# Patient Record
Sex: Female | Born: 1950
Health system: Southern US, Community
[De-identification: ages and names within clinical notes are randomized; demographics above are authoritative.]

## PROBLEM LIST (undated history)

## (undated) DIAGNOSIS — Z800 Family history of malignant neoplasm of digestive organs: Secondary | ICD-10-CM

## (undated) DIAGNOSIS — I341 Nonrheumatic mitral (valve) prolapse: Secondary | ICD-10-CM

## (undated) DIAGNOSIS — B029 Zoster without complications: Secondary | ICD-10-CM

## (undated) DIAGNOSIS — Z803 Family history of malignant neoplasm of breast: Secondary | ICD-10-CM

## (undated) DIAGNOSIS — T7840XA Allergy, unspecified, initial encounter: Secondary | ICD-10-CM

## (undated) DIAGNOSIS — N951 Menopausal and female climacteric states: Secondary | ICD-10-CM

## (undated) DIAGNOSIS — M797 Fibromyalgia: Secondary | ICD-10-CM

## (undated) DIAGNOSIS — C50919 Malignant neoplasm of unspecified site of unspecified female breast: Secondary | ICD-10-CM

## (undated) DIAGNOSIS — M1990 Unspecified osteoarthritis, unspecified site: Secondary | ICD-10-CM

## (undated) DIAGNOSIS — K219 Gastro-esophageal reflux disease without esophagitis: Secondary | ICD-10-CM

## (undated) DIAGNOSIS — E669 Obesity, unspecified: Secondary | ICD-10-CM

## (undated) DIAGNOSIS — I10 Essential (primary) hypertension: Secondary | ICD-10-CM

## (undated) DIAGNOSIS — G4730 Sleep apnea, unspecified: Secondary | ICD-10-CM

## (undated) DIAGNOSIS — R011 Cardiac murmur, unspecified: Secondary | ICD-10-CM

## (undated) DIAGNOSIS — F419 Anxiety disorder, unspecified: Secondary | ICD-10-CM

## (undated) HISTORY — DX: Fibromyalgia: M79.7

## (undated) HISTORY — DX: Nonrheumatic mitral (valve) prolapse: I34.1

## (undated) HISTORY — DX: Family history of malignant neoplasm of digestive organs: Z80.0

## (undated) HISTORY — DX: Essential (primary) hypertension: I10

## (undated) HISTORY — DX: Malignant neoplasm of unspecified site of unspecified female breast: C50.919

## (undated) HISTORY — DX: Family history of malignant neoplasm of breast: Z80.3

## (undated) HISTORY — PX: BIOPSY BREAST: PRO8

## (undated) HISTORY — DX: Gastro-esophageal reflux disease without esophagitis: K21.9

## (undated) HISTORY — DX: Obesity, unspecified: E66.9

## (undated) HISTORY — DX: Sleep apnea, unspecified: G47.30

## (undated) HISTORY — DX: Anxiety disorder, unspecified: F41.9

## (undated) HISTORY — DX: Menopausal and female climacteric states: N95.1

---

## 1994-06-02 HISTORY — PX: KNEE SURGERY: SHX244

## 1999-10-09 ENCOUNTER — Other Ambulatory Visit: Admission: RE | Admit: 1999-10-09 | Discharge: 1999-10-09 | Payer: Self-pay | Admitting: Obstetrics and Gynecology

## 1999-12-31 ENCOUNTER — Encounter: Payer: Self-pay | Admitting: Family Medicine

## 1999-12-31 ENCOUNTER — Encounter: Admission: RE | Admit: 1999-12-31 | Discharge: 1999-12-31 | Payer: Self-pay | Admitting: Family Medicine

## 2000-01-08 ENCOUNTER — Encounter: Payer: Self-pay | Admitting: Family Medicine

## 2000-01-08 ENCOUNTER — Encounter: Admission: RE | Admit: 2000-01-08 | Discharge: 2000-01-08 | Payer: Self-pay | Admitting: Family Medicine

## 2000-10-21 ENCOUNTER — Other Ambulatory Visit: Admission: RE | Admit: 2000-10-21 | Discharge: 2000-10-21 | Payer: Self-pay | Admitting: Obstetrics and Gynecology

## 2001-01-06 ENCOUNTER — Encounter: Admission: RE | Admit: 2001-01-06 | Discharge: 2001-01-06 | Payer: Self-pay | Admitting: Obstetrics and Gynecology

## 2001-01-06 ENCOUNTER — Encounter: Payer: Self-pay | Admitting: Obstetrics and Gynecology

## 2001-01-25 ENCOUNTER — Emergency Department (HOSPITAL_COMMUNITY): Admission: EM | Admit: 2001-01-25 | Discharge: 2001-01-25 | Payer: Self-pay | Admitting: Emergency Medicine

## 2001-01-25 ENCOUNTER — Encounter: Payer: Self-pay | Admitting: Emergency Medicine

## 2001-10-20 ENCOUNTER — Other Ambulatory Visit: Admission: RE | Admit: 2001-10-20 | Discharge: 2001-10-20 | Payer: Self-pay | Admitting: Obstetrics and Gynecology

## 2002-01-12 ENCOUNTER — Encounter: Admission: RE | Admit: 2002-01-12 | Discharge: 2002-01-12 | Payer: Self-pay | Admitting: Obstetrics and Gynecology

## 2002-01-12 ENCOUNTER — Encounter: Payer: Self-pay | Admitting: Obstetrics and Gynecology

## 2002-10-19 ENCOUNTER — Other Ambulatory Visit: Admission: RE | Admit: 2002-10-19 | Discharge: 2002-10-19 | Payer: Self-pay | Admitting: Obstetrics and Gynecology

## 2003-01-24 ENCOUNTER — Encounter: Admission: RE | Admit: 2003-01-24 | Discharge: 2003-01-24 | Payer: Self-pay | Admitting: Obstetrics and Gynecology

## 2003-01-24 ENCOUNTER — Encounter: Payer: Self-pay | Admitting: Obstetrics and Gynecology

## 2003-10-24 ENCOUNTER — Other Ambulatory Visit: Admission: RE | Admit: 2003-10-24 | Discharge: 2003-10-24 | Payer: Self-pay | Admitting: Obstetrics and Gynecology

## 2004-03-26 ENCOUNTER — Encounter: Admission: RE | Admit: 2004-03-26 | Discharge: 2004-03-26 | Payer: Self-pay | Admitting: Obstetrics and Gynecology

## 2005-01-01 ENCOUNTER — Other Ambulatory Visit: Admission: RE | Admit: 2005-01-01 | Discharge: 2005-01-01 | Payer: Self-pay | Admitting: Obstetrics and Gynecology

## 2005-03-31 ENCOUNTER — Encounter: Admission: RE | Admit: 2005-03-31 | Discharge: 2005-03-31 | Payer: Self-pay | Admitting: Obstetrics and Gynecology

## 2006-01-21 ENCOUNTER — Other Ambulatory Visit: Admission: RE | Admit: 2006-01-21 | Discharge: 2006-01-21 | Payer: Self-pay | Admitting: Obstetrics & Gynecology

## 2006-03-19 ENCOUNTER — Ambulatory Visit (HOSPITAL_COMMUNITY): Admission: RE | Admit: 2006-03-19 | Discharge: 2006-03-19 | Payer: Self-pay | Admitting: Obstetrics & Gynecology

## 2006-04-01 ENCOUNTER — Encounter: Admission: RE | Admit: 2006-04-01 | Discharge: 2006-04-01 | Payer: Self-pay | Admitting: Obstetrics and Gynecology

## 2007-01-27 ENCOUNTER — Other Ambulatory Visit: Admission: RE | Admit: 2007-01-27 | Discharge: 2007-01-27 | Payer: Self-pay | Admitting: Obstetrics & Gynecology

## 2007-04-05 ENCOUNTER — Encounter: Admission: RE | Admit: 2007-04-05 | Discharge: 2007-04-05 | Payer: Self-pay | Admitting: Obstetrics and Gynecology

## 2007-11-09 ENCOUNTER — Encounter: Admission: RE | Admit: 2007-11-09 | Discharge: 2007-11-09 | Payer: Self-pay | Admitting: Obstetrics and Gynecology

## 2007-11-17 ENCOUNTER — Encounter: Admission: RE | Admit: 2007-11-17 | Discharge: 2007-11-17 | Payer: Self-pay | Admitting: Gastroenterology

## 2008-02-02 ENCOUNTER — Other Ambulatory Visit: Admission: RE | Admit: 2008-02-02 | Discharge: 2008-02-02 | Payer: Self-pay | Admitting: Obstetrics and Gynecology

## 2008-04-05 ENCOUNTER — Encounter: Admission: RE | Admit: 2008-04-05 | Discharge: 2008-04-05 | Payer: Self-pay | Admitting: Obstetrics and Gynecology

## 2008-06-28 ENCOUNTER — Encounter: Admission: RE | Admit: 2008-06-28 | Discharge: 2008-06-28 | Payer: Self-pay | Admitting: Gastroenterology

## 2009-05-01 ENCOUNTER — Encounter: Admission: RE | Admit: 2009-05-01 | Discharge: 2009-05-01 | Payer: Self-pay | Admitting: Obstetrics and Gynecology

## 2010-06-19 ENCOUNTER — Encounter: Admission: RE | Admit: 2010-06-19 | Discharge: 2010-06-19 | Payer: Self-pay | Admitting: Obstetrics & Gynecology

## 2011-07-10 ENCOUNTER — Other Ambulatory Visit: Payer: Self-pay | Admitting: Obstetrics & Gynecology

## 2011-07-10 DIAGNOSIS — Z1231 Encounter for screening mammogram for malignant neoplasm of breast: Secondary | ICD-10-CM

## 2011-07-27 ENCOUNTER — Ambulatory Visit
Admission: RE | Admit: 2011-07-27 | Discharge: 2011-07-27 | Disposition: A | Discharge status: Home / Self Care | Payer: BC Managed Care – PPO | Source: Ambulatory Visit | Attending: Obstetrics & Gynecology | Admitting: Obstetrics & Gynecology

## 2011-07-27 DIAGNOSIS — Z1231 Encounter for screening mammogram for malignant neoplasm of breast: Secondary | ICD-10-CM

## 2012-04-19 LAB — HM PAP SMEAR: HM Pap smear: NEGATIVE

## 2013-01-31 ENCOUNTER — Other Ambulatory Visit: Payer: Self-pay

## 2013-01-31 DIAGNOSIS — Z1231 Encounter for screening mammogram for malignant neoplasm of breast: Secondary | ICD-10-CM

## 2013-03-15 ENCOUNTER — Ambulatory Visit
Admission: RE | Admit: 2013-03-15 | Discharge: 2013-03-15 | Disposition: A | Discharge status: Home / Self Care | Payer: BC Managed Care – PPO | Source: Ambulatory Visit

## 2013-03-15 DIAGNOSIS — Z1231 Encounter for screening mammogram for malignant neoplasm of breast: Secondary | ICD-10-CM

## 2013-05-12 ENCOUNTER — Encounter: Payer: Self-pay | Admitting: *Deleted

## 2013-05-16 ENCOUNTER — Ambulatory Visit: Payer: Self-pay | Admitting: Nurse Practitioner

## 2013-05-17 ENCOUNTER — Encounter: Payer: Self-pay | Admitting: Nurse Practitioner

## 2013-05-17 ENCOUNTER — Ambulatory Visit (INDEPENDENT_AMBULATORY_CARE_PROVIDER_SITE_OTHER): Payer: BC Managed Care – PPO | Admitting: Nurse Practitioner

## 2013-05-17 VITALS — BP 122/78 | HR 80 | Resp 14 | Ht 65.0 in | Wt 273.0 lb

## 2013-05-17 DIAGNOSIS — Z0000 Encounter for general adult medical examination without abnormal findings: Secondary | ICD-10-CM

## 2013-05-17 DIAGNOSIS — Z01419 Encounter for gynecological examination (general) (routine) without abnormal findings: Secondary | ICD-10-CM

## 2013-05-17 DIAGNOSIS — E559 Vitamin D deficiency, unspecified: Secondary | ICD-10-CM

## 2013-05-17 LAB — POCT URINALYSIS DIPSTICK
Leukocytes, UA: NEGATIVE
pH, UA: 5

## 2013-05-17 MED ORDER — DESOXIMETASONE 0.25 % EX CREA: TOPICAL_CREAM | Freq: Two times a day (BID) | CUTANEOUS | Status: DC

## 2013-05-17 MED ORDER — ESTRADIOL-NORETHINDRONE ACET 0.05-0.14 MG/DAY TD PTTW: 1.0000 | MEDICATED_PATCH | TRANSDERMAL | Status: DC

## 2013-05-17 MED ORDER — VALACYCLOVIR HCL 1 G PO TABS: 1000.0000 mg | ORAL_TABLET | Freq: Two times a day (BID) | ORAL | Status: DC

## 2013-05-17 MED ORDER — VITAMIN D (ERGOCALCIFEROL) 1.25 MG (50000 UNIT) PO CAPS: 50000.0000 [IU] | ORAL_CAPSULE | ORAL | Status: DC

## 2013-05-17 NOTE — Progress Notes (Signed)
62 y.o. G0P0 Single Caucasian Fe here for annual exam.  Still doing well on HRT and wants to continue.  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   Kens' mom had recent fall at nursing home and now has C 2 fracture.  This new responsibility is changing their life.  Patient's last menstrual period was 11/02/2002.          Sexually active: yes  The current method of family planning is post menopausal status.    Exercising: yes  some walking Smoker:  no  Health Maintenance: Pap:  04/19/12 normal with negative HR HPV MMG:  03/15/13  Colonoscopy: 06/2008 Normal recheck in 10 years BMD:   2006 normal TDaP: not sure  Labs: HGB: 12.8 ; Urine: Normal     Past Medical History  Diagnosis Date  . Perimenopausal   . Obesity   . Hypertension   . Anxiety     situational  . Depression     situational  . MVP (mitral valve prolapse)   . GERD (gastroesophageal reflux disease)   . Fibromyalgia     Past Surgical History  Procedure Laterality Date  . Knee surgery Right 06/1994    Current Outpatient Prescriptions  Medication Sig Dispense Refill  . calcium carbonate (OS-CAL - DOSED IN MG OF ELEMENTAL CALCIUM) 1250 MG tablet Take 1 tablet by mouth daily with breakfast.      . candesartan (ATACAND) 4 MG tablet Take 4 mg by mouth daily.      . cetirizine (ZYRTEC) 10 MG tablet Take 10 mg by mouth daily.      Marland Kitchen esomeprazole (NEXIUM) 40 MG capsule Take 40 mg by mouth daily before breakfast.      . estradiol-norethindrone (COMBIPATCH) 0.05-0.14 MG/DAY Place 1 patch onto the skin 2 (two) times a week.      . loratadine (CLARITIN) 10 MG tablet Take 10 mg by mouth daily.      . mometasone (NASONEX) 50 MCG/ACT nasal spray Place 2 sprays into the nose daily.      . Multiple Vitamin (MULTIVITAMIN) tablet Take 1 tablet by mouth daily.      Marland Kitchen omega-3 acid ethyl esters (LOVAZA) 1 G capsule Take 2 g by mouth 2 (two) times daily.      . rosuvastatin (CRESTOR) 5 MG tablet Take 5 mg by mouth daily.       . valACYclovir (VALTREX) 1000 MG tablet Take 1,000 mg by mouth 2 (two) times daily.      . Vitamin D, Ergocalciferol, (DRISDOL) 50000 UNITS CAPS Take 50,000 Units by mouth every 7 (seven) days.       No current facility-administered medications for this visit.    Family History  Problem Relation Age of Onset  . Diabetes Mother   . Hypertension Mother   . Hypertension Father   . Infertility Sister   . Cancer Paternal Grandmother     pancreatic cancer    ROS:  Pertinent items are noted in HPI.  Otherwise, a comprehensive ROS was negative.  Exam:   BP 122/78  Pulse 80  Resp 14  Ht 5\' 5"  (1.651 m)  Wt 273 lb (123.832 kg)  BMI 45.43 kg/m2  LMP 11/02/2002 Height: 5\' 5"  (165.1 cm)  Ht Readings from Last 3 Encounters:  05/17/13 5\' 5"  (1.651 m)    General appearance: alert, cooperative and appears stated age Head: Normocephalic, without obvious abnormality, atraumatic Neck: no adenopathy, supple, symmetrical, trachea midline and thyroid normal to inspection and  palpation Lungs: clear to auscultation bilaterally Breasts: normal appearance, no masses or tenderness Heart: regular rate and rhythm Abdomen: soft, non-tender; no masses,  no organomegaly Extremities: extremities normal, atraumatic, no cyanosis or edema Skin: Skin color, texture, turgor normal. No rashes or lesions Lymph nodes: Cervical, supraclavicular, and axillary nodes normal. No abnormal inguinal nodes palpated Neurologic: Grossly normal   Pelvic: External genitalia:  no lesions              Urethra:  normal appearing urethra with no masses, tenderness or lesions              Bartholin's and Skene's: normal                 Vagina: normal appearing vagina with normal color and discharge, no lesions              Cervix: anteverted              Pap taken: no Bimanual Exam:  Uterus:  normal size, contour, position, consistency, mobility, non-tender              Adnexa: no mass, fullness, tenderness                Rectovaginal: Confirms               Anus:  normal sphincter tone, no lesions  A:  Well Woman with normal exam   Postmenopausal on HRT  P:   Pap smear as per guidelines   Mammogram  Due 5/15  Refill on HRT Combipatch for 1 year  Discussion about potential risks including CVA, DVT, cancer, etc  Refill on Valtrex prn oral HSV and Topicort for eczema  Refill on Vit D call with lab results  counseled on breast self exam, adequate intake of calcium and vitamin D,   diet and exercise return annually or prn  An After Visit Summary was printed and given to the patient.

## 2013-05-17 NOTE — Patient Instructions (Signed)

## 2013-05-18 ENCOUNTER — Telehealth: Payer: Self-pay | Admitting: *Deleted

## 2013-05-18 LAB — VITAMIN D 25 HYDROXY (VIT D DEFICIENCY, FRACTURES): Vit D, 25-Hydroxy: 27 ng/mL — ABNORMAL LOW (ref 30–89)

## 2013-05-18 NOTE — Telephone Encounter (Signed)
Message copied by Osie Bond on Thu May 18, 2013  1:38 PM ------      Message from: Ria Comment R      Created: Thu May 18, 2013  8:53 AM       Vit D is low again follow protocol ------

## 2013-05-18 NOTE — Telephone Encounter (Signed)
Pt is aware of vitamin D lab results and is agreeable to Vitamin D 50,000 IU 1 po qweekly (pt has Rx from aex visit).

## 2013-05-19 NOTE — Progress Notes (Signed)
Encounter reviewed by Dr. Briann Sarchet Silva.  

## 2014-02-13 ENCOUNTER — Other Ambulatory Visit: Payer: Self-pay

## 2014-02-13 DIAGNOSIS — Z1231 Encounter for screening mammogram for malignant neoplasm of breast: Secondary | ICD-10-CM

## 2014-03-19 ENCOUNTER — Ambulatory Visit: Payer: BC Managed Care – PPO

## 2014-05-17 ENCOUNTER — Ambulatory Visit: Payer: BC Managed Care – PPO

## 2014-05-22 ENCOUNTER — Encounter (INDEPENDENT_AMBULATORY_CARE_PROVIDER_SITE_OTHER): Payer: Self-pay

## 2014-05-22 ENCOUNTER — Ambulatory Visit: Payer: BC Managed Care – PPO

## 2014-05-22 ENCOUNTER — Ambulatory Visit
Admission: RE | Admit: 2014-05-22 | Discharge: 2014-05-22 | Disposition: A | Discharge status: Home / Self Care | Payer: BC Managed Care – PPO | Source: Ambulatory Visit

## 2014-05-22 DIAGNOSIS — Z1231 Encounter for screening mammogram for malignant neoplasm of breast: Secondary | ICD-10-CM

## 2014-05-26 DIAGNOSIS — IMO0002 Reserved for concepts with insufficient information to code with codable children: Secondary | ICD-10-CM

## 2014-05-26 DIAGNOSIS — I059 Rheumatic mitral valve disease, unspecified: Secondary | ICD-10-CM | POA: Insufficient documentation

## 2014-05-26 DIAGNOSIS — G4733 Obstructive sleep apnea (adult) (pediatric): Secondary | ICD-10-CM

## 2014-05-26 DIAGNOSIS — E559 Vitamin D deficiency, unspecified: Secondary | ICD-10-CM

## 2014-05-26 DIAGNOSIS — E669 Obesity, unspecified: Secondary | ICD-10-CM | POA: Insufficient documentation

## 2014-05-26 DIAGNOSIS — M545 Low back pain, unspecified: Secondary | ICD-10-CM

## 2014-05-26 DIAGNOSIS — IMO0001 Reserved for inherently not codable concepts without codable children: Secondary | ICD-10-CM

## 2014-05-26 DIAGNOSIS — E785 Hyperlipidemia, unspecified: Secondary | ICD-10-CM

## 2014-05-26 DIAGNOSIS — I10 Essential (primary) hypertension: Secondary | ICD-10-CM | POA: Insufficient documentation

## 2014-05-26 DIAGNOSIS — M722 Plantar fascial fibromatosis: Secondary | ICD-10-CM

## 2014-05-26 DIAGNOSIS — K529 Noninfective gastroenteritis and colitis, unspecified: Secondary | ICD-10-CM | POA: Insufficient documentation

## 2014-05-26 DIAGNOSIS — R740 Nonspecific elevation of levels of transaminase and lactic acid dehydrogenase [LDH]: Secondary | ICD-10-CM

## 2014-05-26 DIAGNOSIS — E049 Nontoxic goiter, unspecified: Secondary | ICD-10-CM

## 2014-05-26 DIAGNOSIS — K760 Fatty (change of) liver, not elsewhere classified: Secondary | ICD-10-CM

## 2014-05-26 DIAGNOSIS — E782 Mixed hyperlipidemia: Secondary | ICD-10-CM | POA: Insufficient documentation

## 2014-05-26 HISTORY — DX: Reserved for inherently not codable concepts without codable children: IMO0001

## 2014-05-26 HISTORY — DX: Reserved for concepts with insufficient information to code with codable children: IMO0002

## 2014-05-26 HISTORY — DX: Fatty (change of) liver, not elsewhere classified: K76.0

## 2014-05-26 HISTORY — DX: Essential (primary) hypertension: I10

## 2014-05-26 HISTORY — DX: Plantar fascial fibromatosis: M72.2

## 2014-05-26 HISTORY — DX: Rheumatic mitral valve disease, unspecified: I05.9

## 2014-05-26 HISTORY — DX: Obstructive sleep apnea (adult) (pediatric): G47.33

## 2014-05-26 HISTORY — DX: Hyperlipidemia, unspecified: E78.5

## 2014-05-26 HISTORY — DX: Low back pain, unspecified: M54.50

## 2014-05-26 HISTORY — DX: Vitamin D deficiency, unspecified: E55.9

## 2014-05-26 HISTORY — DX: Obesity, unspecified: E66.9

## 2014-05-26 HISTORY — DX: Nontoxic goiter, unspecified: E04.9

## 2014-05-29 ENCOUNTER — Ambulatory Visit (INDEPENDENT_AMBULATORY_CARE_PROVIDER_SITE_OTHER): Payer: BC Managed Care – PPO | Admitting: Nurse Practitioner

## 2014-05-29 ENCOUNTER — Encounter: Payer: Self-pay | Admitting: Nurse Practitioner

## 2014-05-29 VITALS — BP 140/76 | HR 68 | Resp 18 | Ht 65.0 in | Wt 265.0 lb

## 2014-05-29 DIAGNOSIS — Z0000 Encounter for general adult medical examination without abnormal findings: Secondary | ICD-10-CM

## 2014-05-29 DIAGNOSIS — Z01419 Encounter for gynecological examination (general) (routine) without abnormal findings: Secondary | ICD-10-CM

## 2014-05-29 DIAGNOSIS — Z1211 Encounter for screening for malignant neoplasm of colon: Secondary | ICD-10-CM

## 2014-05-29 LAB — CBC WITH DIFFERENTIAL/PLATELET
Basophils Absolute: 0 10*3/uL (ref 0.0–0.1)
Basophils Relative: 0 % (ref 0–1)
Eosinophils Absolute: 0.1 10*3/uL (ref 0.0–0.7)
Eosinophils Relative: 1 % (ref 0–5)
HEMATOCRIT: 38.5 % (ref 36.0–46.0)
Hemoglobin: 13 g/dL (ref 12.0–15.0)
LYMPHS PCT: 34 % (ref 12–46)
Lymphs Abs: 2.1 10*3/uL (ref 0.7–4.0)
MCH: 29 pg (ref 26.0–34.0)
MCHC: 33.8 g/dL (ref 30.0–36.0)
MCV: 85.9 fL (ref 78.0–100.0)
MONO ABS: 0.4 10*3/uL (ref 0.1–1.0)
Monocytes Relative: 6 % (ref 3–12)
Neutro Abs: 3.6 10*3/uL (ref 1.7–7.7)
Neutrophils Relative %: 59 % (ref 43–77)
Platelets: 259 10*3/uL (ref 150–400)
RBC: 4.48 MIL/uL (ref 3.87–5.11)
RDW: 14.1 % (ref 11.5–15.5)
WBC: 6.1 10*3/uL (ref 4.0–10.5)

## 2014-05-29 LAB — POCT URINALYSIS DIPSTICK
Bilirubin, UA: NEGATIVE
Glucose, UA: NEGATIVE
Ketones, UA: NEGATIVE
Leukocytes, UA: NEGATIVE
Nitrite, UA: NEGATIVE
PROTEIN UA: NEGATIVE
RBC UA: NEGATIVE
UROBILINOGEN UA: NEGATIVE
pH, UA: 7

## 2014-05-29 LAB — LIPID PANEL
CHOL/HDL RATIO: 5 ratio
CHOLESTEROL: 237 mg/dL — AB (ref 0–200)
HDL: 47 mg/dL (ref >39)
LDL CALC: 139 mg/dL — AB (ref 0–99)
Triglycerides: 253 mg/dL — ABNORMAL HIGH (ref <150)
VLDL: 51 mg/dL — AB (ref 0–40)

## 2014-05-29 LAB — COMPREHENSIVE METABOLIC PANEL
ALK PHOS: 60 U/L (ref 39–117)
ALT: 19 U/L (ref 0–35)
AST: 18 U/L (ref 0–37)
Albumin: 4 g/dL (ref 3.5–5.2)
BILIRUBIN TOTAL: 0.4 mg/dL (ref 0.2–1.2)
BUN: 16 mg/dL (ref 6–23)
CO2: 28 meq/L (ref 19–32)
CREATININE: 0.8 mg/dL (ref 0.50–1.10)
Calcium: 9.4 mg/dL (ref 8.4–10.5)
Chloride: 104 mEq/L (ref 96–112)
Glucose, Bld: 86 mg/dL (ref 70–99)
Potassium: 4.3 mEq/L (ref 3.5–5.3)
Sodium: 139 mEq/L (ref 135–145)
Total Protein: 7.1 g/dL (ref 6.0–8.3)

## 2014-05-29 LAB — TSH: TSH: 1.55 u[IU]/mL (ref 0.350–4.500)

## 2014-05-29 LAB — HEMOGLOBIN A1C
Hgb A1c MFr Bld: 6 % — ABNORMAL HIGH (ref <5.7)
Mean Plasma Glucose: 126 mg/dL — ABNORMAL HIGH (ref <117)

## 2014-05-29 LAB — HEMOGLOBIN, FINGERSTICK: Hemoglobin, fingerstick: 12.7 g/dL (ref 12.0–16.0)

## 2014-05-29 MED ORDER — VALACYCLOVIR HCL 1 G PO TABS: 1000.0000 mg | ORAL_TABLET | Freq: Two times a day (BID) | ORAL | Status: DC

## 2014-05-29 MED ORDER — DESOXIMETASONE 0.25 % EX CREA: TOPICAL_CREAM | Freq: Two times a day (BID) | CUTANEOUS | Status: DC

## 2014-05-29 MED ORDER — VITAMIN D (ERGOCALCIFEROL) 1.25 MG (50000 UNIT) PO CAPS: 50000.0000 [IU] | ORAL_CAPSULE | ORAL | Status: DC

## 2014-05-29 MED ORDER — ESTRADIOL-NORETHINDRONE ACET 0.05-0.14 MG/DAY TD PTTW: 1.0000 | MEDICATED_PATCH | TRANSDERMAL | Status: DC

## 2014-05-29 NOTE — Progress Notes (Signed)
63 y.o. G0P0 Single Caucasian Fe here for annual exam.  No vaginal bleeding.  No vaso symptoms. Doing well on HRT and wants to continue. Caring for mother now age 69.  Ken's mother died in 05-Nov-2022.  She wants labs done in preparation to go see PCP and cardiologist.   Patient's last menstrual period was 11-05-2002.          Sexually active: Yes.    The current method of family planning is post menopausal status.    Exercising: Yes.    Walking Smoker:  no  Health Maintenance: Pap: 04/2012 Neg. HR HPV: Neg MMG: 05/23/14 BIRADS1: Neg Colonoscopy: 06/2008 Normal- Repeat in 10 years  BMD:  12/2013 TDaP: Not recommended = allergic reaction Labs:   UA: neg Hg: 12.7   reports that she has never smoked. She has never used smokeless tobacco. She reports that she does not drink alcohol or use illicit drugs.  Past Medical History  Diagnosis Date  . Perimenopausal   . Obesity   . Hypertension   . Anxiety     situational  . Depression     situational  . MVP (mitral valve prolapse)   . GERD (gastroesophageal reflux disease)   . Fibromyalgia     Past Surgical History  Procedure Laterality Date  . Knee surgery Right 06/1994    Current Outpatient Prescriptions  Medication Sig Dispense Refill  . calcium carbonate (OS-CAL - DOSED IN MG OF ELEMENTAL CALCIUM) 1250 MG tablet Take 1 tablet by mouth daily with breakfast.      . candesartan (ATACAND) 4 MG tablet Take 4 mg by mouth daily.      . cetirizine (ZYRTEC) 10 MG tablet Take 10 mg by mouth daily.      Marland Kitchen desoximetasone (TOPICORT) 0.25 % cream Apply topically 2 (two) times daily.  30 g  3  . esomeprazole (NEXIUM) 40 MG capsule Take 40 mg by mouth daily before breakfast.      . estradiol-norethindrone (COMBIPATCH) 0.05-0.14 MG/DAY Place 1 patch onto the skin 2 (two) times a week.  24 patch  3  . mometasone (NASONEX) 50 MCG/ACT nasal spray Place 2 sprays into the nose as needed.       . Multiple Vitamin (MULTIVITAMIN) tablet Take 1 tablet by mouth  daily.      Marland Kitchen omega-3 acid ethyl esters (LOVAZA) 1 G capsule Take 2 g by mouth 2 (two) times daily.      . valACYclovir (VALTREX) 1000 MG tablet Take 1 tablet (1,000 mg total) by mouth 2 (two) times daily.  30 tablet  12  . Vitamin D, Ergocalciferol, (DRISDOL) 50000 UNITS CAPS capsule Take 1 capsule (50,000 Units total) by mouth every 7 (seven) days.  30 capsule  3   No current facility-administered medications for this visit.    Family History  Problem Relation Age of Onset  . Diabetes Mother   . Hypertension Mother   . Hypertension Father   . Infertility Sister   . Cancer Paternal Grandmother     pancreatic cancer    ROS:  Pertinent items are noted in HPI.  Otherwise, a comprehensive ROS was negative.  Exam:   BP 140/76  Pulse 68  Resp 18  Ht 5\' 5"  (1.651 m)  Wt 265 lb (120.203 kg)  BMI 44.10 kg/m2  LMP 11-05-2002 Height: 5\' 5"  (165.1 cm)  Ht Readings from Last 3 Encounters:  05/29/14 5\' 5"  (1.651 m)  05/17/13 5\' 5"  (1.651 m)    General appearance:  alert, cooperative and appears stated age Head: Normocephalic, without obvious abnormality, atraumatic Neck: no adenopathy, supple, symmetrical, trachea midline and thyroid normal to inspection and palpation Lungs: clear to auscultation bilaterally Breasts: normal appearance, no masses or tenderness Heart: regular rate and rhythm Abdomen: soft, non-tender; no masses,  no organomegaly Extremities: extremities normal, atraumatic, no cyanosis or edema Skin: Skin color, texture, turgor normal. No rashes or lesions Lymph nodes: Cervical, supraclavicular, and axillary nodes normal. No abnormal inguinal nodes palpated Neurologic: Grossly normal   Pelvic: External genitalia:  no lesions              Urethra:  normal appearing urethra with no masses, tenderness or lesions              Bartholin's and Skene's: normal                 Vagina: normal appearing vagina with normal color and discharge, no lesions              Cervix:  anteverted              Pap taken: No. Bimanual Exam:  Uterus:  normal size, contour, position, consistency, mobility, non-tender              Adnexa: no mass, fullness, tenderness               Rectovaginal: Confirms               Anus:  normal sphincter tone, no lesions  A:  Well Woman with normal exam  Postmenopausal on HRT  History of anxiety and depression  History of MVP with SBE prophylaxis  History of seasonal allergies  P:   Reviewed health and wellness pertinent to exam  Pap smear not taken today  Mammogram is due 7/16  Refill on Combipatch for a year  Counseled about risk of DVT, CVA, cancer, etc.  IFOB given today  Counseled on breast self exam, mammography screening, use and side effects of HRT, adequate intake of calcium and vitamin D, diet and exercise, Kegel's exercises return annually or prn  An After Visit Summary was printed and given to the patient.

## 2014-05-29 NOTE — Patient Instructions (Signed)

## 2014-05-30 LAB — VITAMIN D 25 HYDROXY (VIT D DEFICIENCY, FRACTURES): VIT D 25 HYDROXY: 32 ng/mL (ref 30–89)

## 2014-05-31 NOTE — Progress Notes (Signed)
Encounter reviewed by Dr. Brook Silva.  

## 2014-09-17 ENCOUNTER — Other Ambulatory Visit: Payer: Self-pay | Admitting: Nurse Practitioner

## 2015-05-19 ENCOUNTER — Other Ambulatory Visit: Payer: Self-pay | Admitting: Nurse Practitioner

## 2015-05-20 NOTE — Telephone Encounter (Signed)
Medication refill request: Combipatch 0.05/0.14 Last AEX:  05/29/14 with PG Next AEX: 06/05/15 with PG Last MMG (if hormonal medication request): 05/22/14 Bi-rads C 1 neg.  Refill authorized: Please advise.

## 2015-06-05 ENCOUNTER — Ambulatory Visit (INDEPENDENT_AMBULATORY_CARE_PROVIDER_SITE_OTHER): Payer: BC Managed Care – PPO | Admitting: Nurse Practitioner

## 2015-06-05 ENCOUNTER — Encounter: Payer: Self-pay | Admitting: Nurse Practitioner

## 2015-06-05 VITALS — BP 128/82 | HR 64 | Ht 65.0 in | Wt 264.0 lb

## 2015-06-05 DIAGNOSIS — Z01419 Encounter for gynecological examination (general) (routine) without abnormal findings: Secondary | ICD-10-CM

## 2015-06-05 DIAGNOSIS — Z0000 Encounter for general adult medical examination without abnormal findings: Secondary | ICD-10-CM

## 2015-06-05 DIAGNOSIS — E559 Vitamin D deficiency, unspecified: Secondary | ICD-10-CM

## 2015-06-05 LAB — POCT URINALYSIS DIPSTICK
BILIRUBIN UA: NEGATIVE
Blood, UA: NEGATIVE
Glucose, UA: NEGATIVE
Ketones, UA: NEGATIVE
Leukocytes, UA: NEGATIVE
NITRITE UA: NEGATIVE
Protein, UA: NEGATIVE
UROBILINOGEN UA: NEGATIVE
pH, UA: 5

## 2015-06-05 MED ORDER — ESTRADIOL-NORETHINDRONE ACET 0.05-0.14 MG/DAY TD PTTW: MEDICATED_PATCH | TRANSDERMAL | Status: DC

## 2015-06-05 MED ORDER — VALACYCLOVIR HCL 1 G PO TABS: 1000.0000 mg | ORAL_TABLET | Freq: Two times a day (BID) | ORAL | Status: DC

## 2015-06-05 MED ORDER — VITAMIN D (ERGOCALCIFEROL) 1.25 MG (50000 UNIT) PO CAPS: 50000.0000 [IU] | ORAL_CAPSULE | ORAL | Status: DC

## 2015-06-05 NOTE — Progress Notes (Signed)
Patient ID: Catherine [name], female   DOB: 1951/01/05, 64 y.o.   MRN: [id number] 64 y.o. G0P0 Single  Caucasian Fe here for annual exam.  No new problems this year.  Took Crestor and had side effects that gave her a 'brain fog'.  Her mother now age 55 is having a lot of health issues and needs constant care. She is traveling to [place]. [place] several times a week.   Patient's last menstrual period was 11/02/2002 (approximate).          Sexually active: Yes.    The current method of family planning is post menopausal status.    Exercising: Yes.    walking Smoker:  no  Health Maintenance: Pap:  04/19/12, Negative with neg HR HPV MMG: 05/23/14 3D, Bi-Rads 1: Neg will schedule Colonoscopy: 06/2008 Normal- Repeat in 10 years  BMD: 12/2013, normal, [facility] through allergist TDaP: Not recommended = allergic reaction Labs:  HB:  13.0    Urine:  Negative    reports that she has never smoked. She has never used smokeless tobacco. She reports that she does not drink alcohol or use illicit drugs.  Past Medical History  Diagnosis Date  . Perimenopausal   . Obesity   . Hypertension   . Anxiety     situational  . Depression     situational  . MVP (mitral valve prolapse)   . GERD (gastroesophageal reflux disease)   . Fibromyalgia     Past Surgical History  Procedure Laterality Date  . Knee surgery Right 06/1994    Current Outpatient Prescriptions  Medication Sig Dispense Refill  . calcium carbonate (OS-CAL - DOSED IN MG OF ELEMENTAL CALCIUM) 1250 MG tablet Take 1 tablet by mouth daily with breakfast.    . candesartan (ATACAND) 4 MG tablet Take 4 mg by mouth daily.    . cetirizine (ZYRTEC) 10 MG tablet Take 10 mg by mouth daily.    Marland Kitchen desoximetasone (TOPICORT) 0.25 % cream Apply topically 2 (two) times daily. 30 g 3  . EPINEPHrine (EPIPEN 2-PAK) 0.3 mg/0.3 mL IJ SOAJ injection 0.3 mLs as directed.    Marland Kitchen esomeprazole (NEXIUM) 40 MG capsule Take 40 mg by mouth daily before breakfast.    .  estradiol-norethindrone (COMBIPATCH) 0.05-0.14 MG/DAY PLACE 1 PATCH ONTO SKIN TWICE A WEEK 24 patch 4  . mometasone (NASONEX) 50 MCG/ACT nasal spray Place 2 sprays into the nose as needed.     . Multiple Vitamin (MULTIVITAMIN) tablet Take 1 tablet by mouth daily.    Marland Kitchen omega-3 acid ethyl esters (LOVAZA) 1 G capsule Take 2 g by mouth 2 (two) times daily.    . valACYclovir (VALTREX) 1000 MG tablet Take 1 tablet (1,000 mg total) by mouth 2 (two) times daily. 30 tablet 12  . Vitamin D, Ergocalciferol, (DRISDOL) 50000 UNITS CAPS capsule Take 1 capsule (50,000 Units total) by mouth every 7 (seven) days. 30 capsule 3   No current facility-administered medications for this visit.    Family History  Problem Relation Age of Onset  . Diabetes Mother   . Hypertension Mother   . Hypertension Father   . Infertility Sister   . Cancer Paternal Grandmother     pancreatic cancer    ROS:  Pertinent items are noted in HPI.  Otherwise, a comprehensive ROS was negative.  Exam:   BP 128/82 mmHg  Pulse 64  Ht 5\' 5"  (1.651 m)  Wt 264 lb (119.75 kg)  BMI 43.93 kg/m2  LMP 11/02/2002 (Approximate)  Height: 5\' 5"  (165.1 cm) Ht Readings from Last 3 Encounters:  06/05/15 5\' 5"  (1.651 m)  05/29/14 5\' 5"  (1.651 m)  05/17/13 5\' 5"  (1.651 m)    General appearance: alert, cooperative and appears stated age Head: Normocephalic, without obvious abnormality, atraumatic Neck: no adenopathy, supple, symmetrical, trachea midline and thyroid normal to inspection and palpation Lungs: clear to auscultation bilaterally Breasts: normal appearance, no masses or tenderness Heart: regular rate and rhythm Abdomen: soft, non-tender; no masses,  no organomegaly Extremities: extremities normal, atraumatic, no cyanosis or edema Skin: Skin color, texture, turgor normal. No rashes or lesions Lymph nodes: Cervical, supraclavicular, and axillary nodes normal. No abnormal inguinal nodes palpated Neurologic: Grossly  normal   Pelvic: External genitalia:  no lesions              Urethra:  normal appearing urethra with no masses, tenderness or lesions              Bartholin's and Skene's: normal                 Vagina: normal appearing vagina with normal color and discharge, no lesions              Cervix: anteverted              Pap taken: Yes.   Bimanual Exam:  Uterus:  normal size, contour, position, consistency, mobility, non-tender              Adnexa: no mass, fullness, tenderness               Rectovaginal: Confirms               Anus:  normal sphincter tone, no lesions  Chaperone present:  yes   A:  Well Woman with normal exam  Postmenopausal on HRT History of anxiety and depression History of MVP with SBE prophylaxis History of seasonal allergies   P:   Reviewed health and wellness pertinent to exam  Pap smear as above  Mammogram is due and is scheduled  Refill on Combipatch - will now start to taper 1/2 patch alternate with whole patch every other dose for the summer.    Refill on Vit D and follow with labs - she has not been very compliant to dosing  Counseled on breast self exam, mammography screening, use and side effects of HRT, adequate intake of calcium and vitamin D, diet and exercise return annually or prn  An After Visit Summary was printed and given to the patient.

## 2015-06-05 NOTE — Patient Instructions (Signed)

## 2015-06-06 LAB — HEMOGLOBIN, FINGERSTICK: HEMOGLOBIN, FINGERSTICK: 13 g/dL (ref 12.0–16.0)

## 2015-06-06 LAB — VITAMIN D 25 HYDROXY (VIT D DEFICIENCY, FRACTURES): Vit D, 25-Hydroxy: 17 ng/mL — ABNORMAL LOW (ref 30–100)

## 2015-06-07 ENCOUNTER — Telehealth: Payer: Self-pay | Admitting: *Deleted

## 2015-06-07 NOTE — Telephone Encounter (Signed)
I have attempted to contact this patient by phone with the following results: left message to return call to [place] at [phone] on answering machine (mobile per [facility]).  No personal information given.  [phone] (Mobile) *Preferred*

## 2015-06-07 NOTE — Telephone Encounter (Signed)
-----   Message from Regina Eck, CNM sent at 06/06/2015  8:30 AM EDT ----- Notify Vitamin D is low, protocol

## 2015-06-08 NOTE — Progress Notes (Signed)
Encounter reviewed by Dr. Brook Amundson C. Silva.  

## 2015-06-10 LAB — IPS PAP TEST WITH HPV

## 2015-06-13 NOTE — Telephone Encounter (Signed)
Pt notified in result note 06/07/15.  Closing encounter.

## 2015-06-28 DIAGNOSIS — J309 Allergic rhinitis, unspecified: Secondary | ICD-10-CM | POA: Insufficient documentation

## 2015-06-28 DIAGNOSIS — K219 Gastro-esophageal reflux disease without esophagitis: Secondary | ICD-10-CM | POA: Insufficient documentation

## 2015-06-28 DIAGNOSIS — H1010 Acute atopic conjunctivitis, unspecified eye: Secondary | ICD-10-CM

## 2015-06-28 HISTORY — DX: Acute atopic conjunctivitis, unspecified eye: H10.10

## 2015-06-28 HISTORY — DX: Gastro-esophageal reflux disease without esophagitis: K21.9

## 2015-06-28 HISTORY — DX: Allergic rhinitis, unspecified: J30.9

## 2015-08-11 IMAGING — MG MM SCREENING BREAST TOMO BILATERAL
8 of 14 series · 8 of 30 positions shown · non-contrast
Comparison: Previous exam(s).

CLINICAL DATA: Screening.

EXAM:
DIGITAL SCREENING BILATERAL MAMMOGRAM WITH 3D TOMO WITH CAD

[L MLO (1 of 2)]
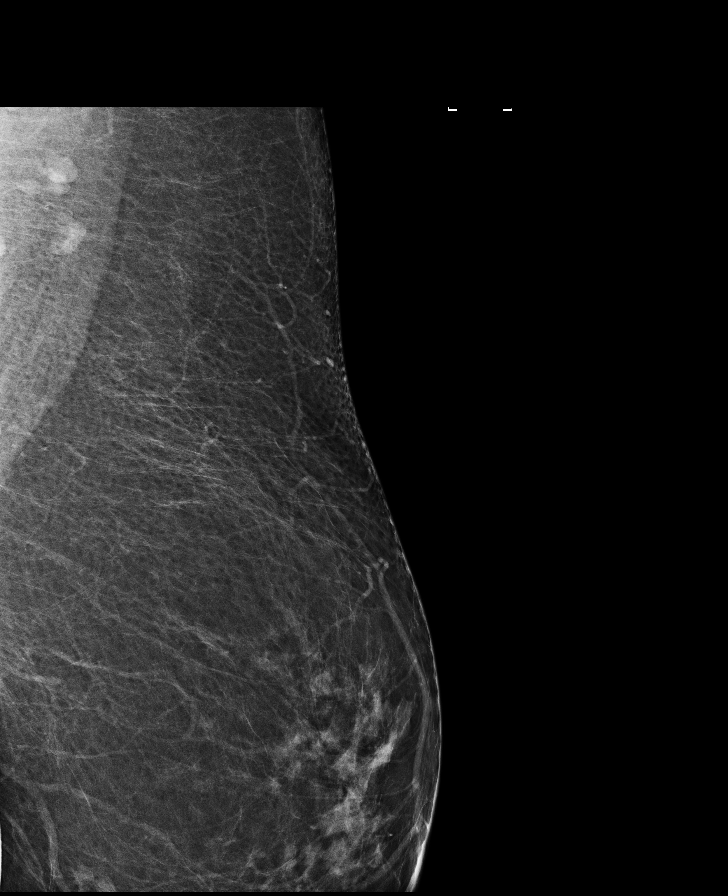

[R MLO]
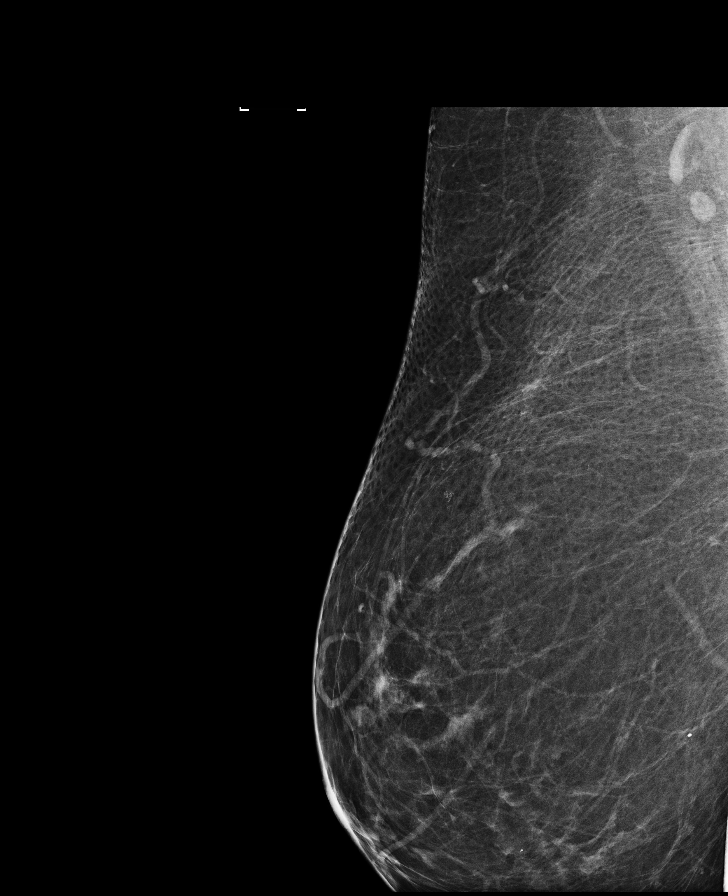

[L CC synth-2D]
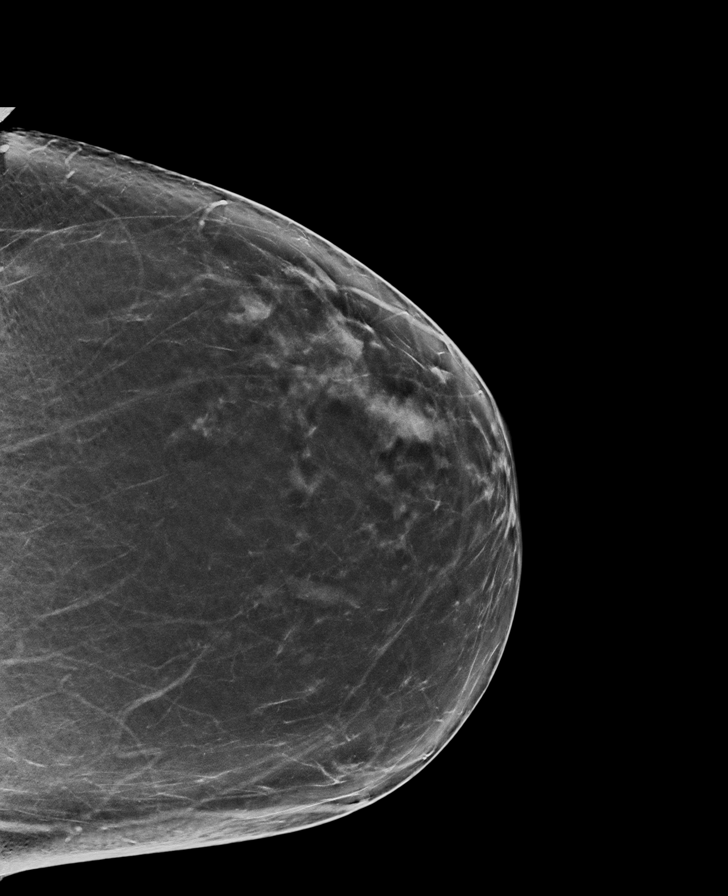

[R CC synth-2D]
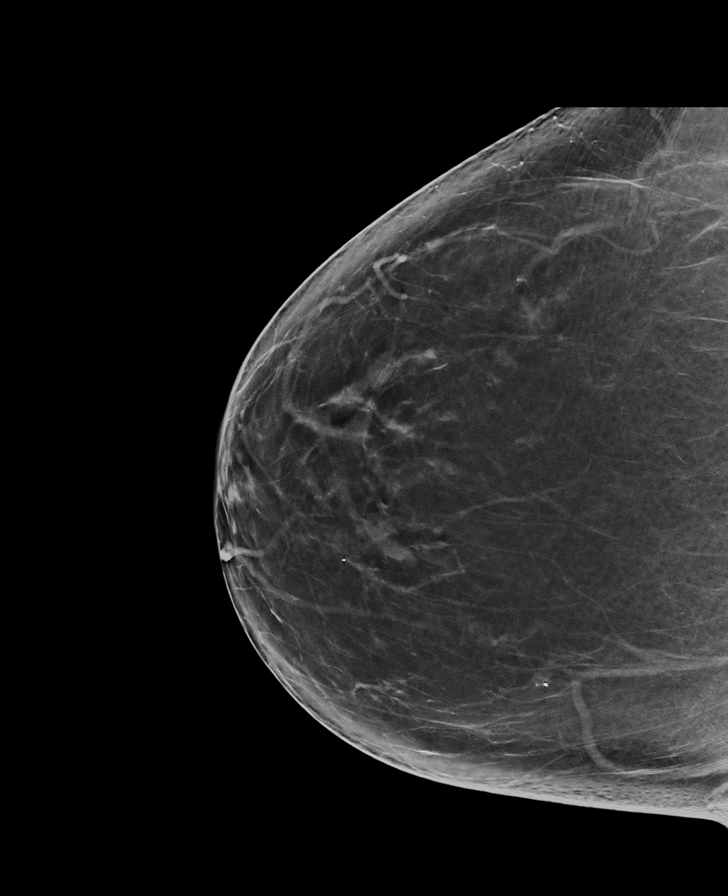

[L MLO synth-2D]
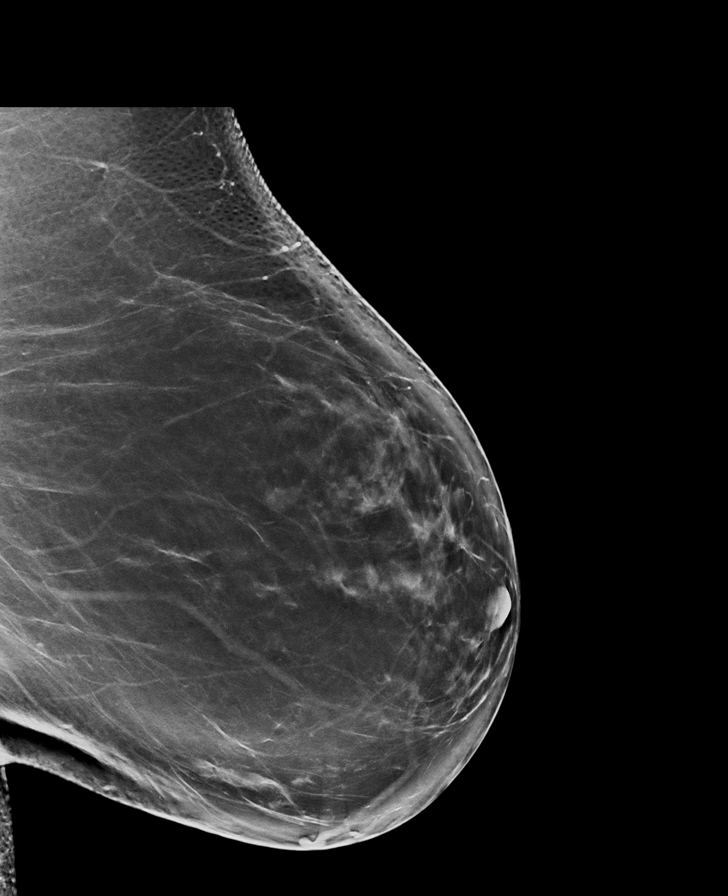

[L MLO (2 of 2)]
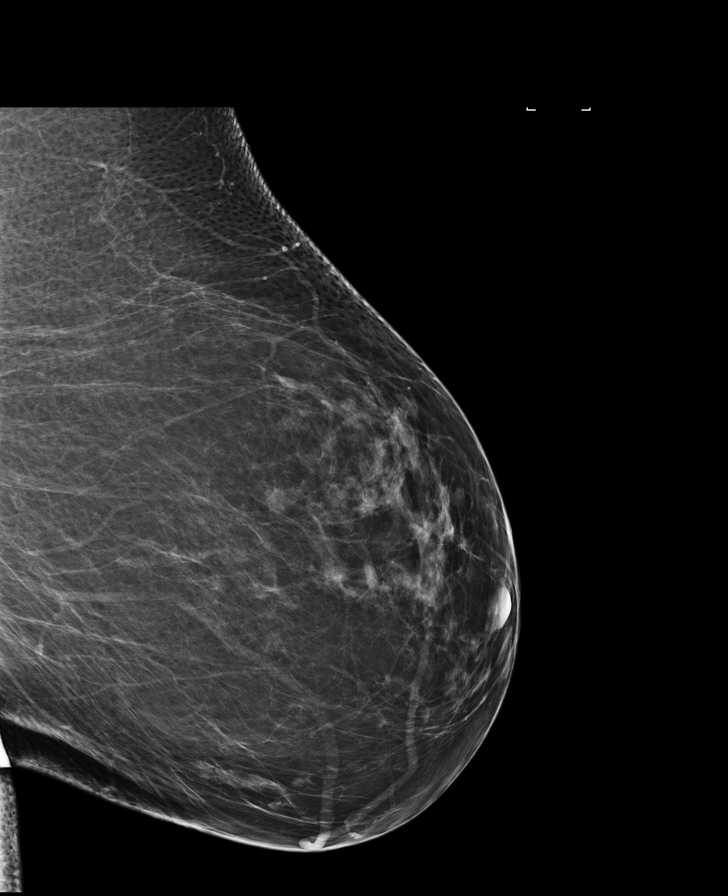

[R MLO synth-2D]
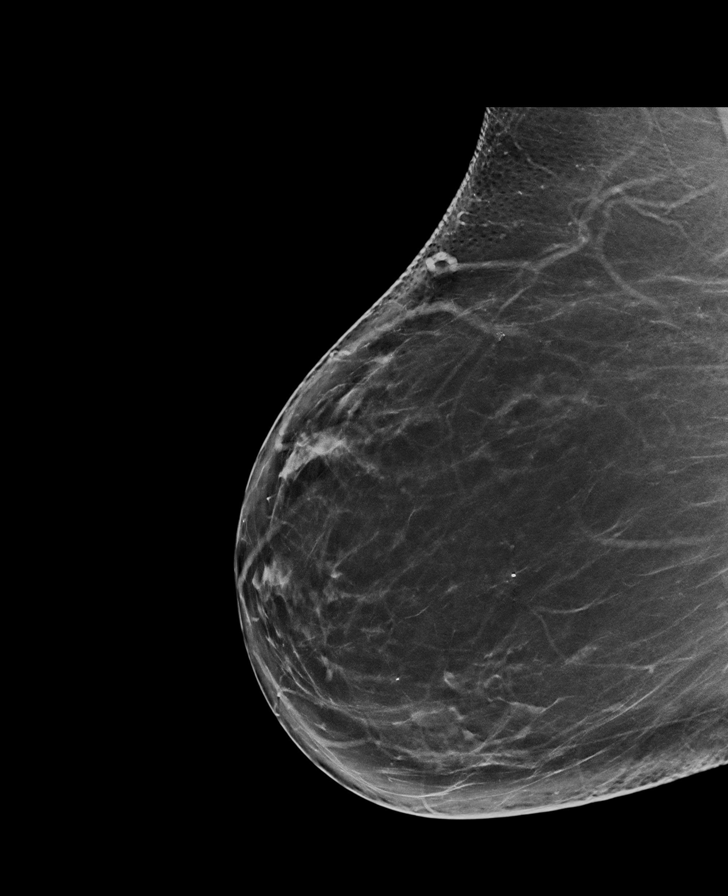

[L CC]
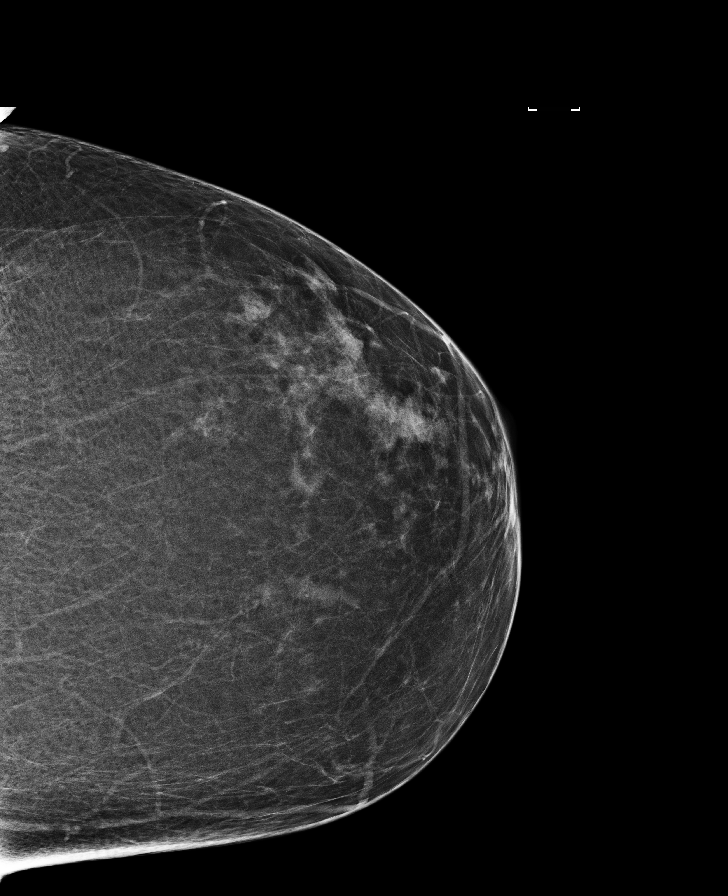

[8 of 30 positions shown; findings below may reference images not displayed]

ACR Breast Density Category b: There are scattered areas of
fibroglandular density.
FINDINGS: There has been no significant interval change and there are no
findings suspicious for malignancy. Images were processed with CAD.
IMPRESSION: No mammographic evidence of malignancy. A result letter of this
screening mammogram will be mailed directly to the patient.

RECOMMENDATION:
Screening mammogram in one year. (Code:TP-G-A98)

BI-RADS CATEGORY  1: Negative.

## 2015-08-19 ENCOUNTER — Ambulatory Visit (INDEPENDENT_AMBULATORY_CARE_PROVIDER_SITE_OTHER): Payer: BC Managed Care – PPO

## 2015-08-19 DIAGNOSIS — J309 Allergic rhinitis, unspecified: Secondary | ICD-10-CM | POA: Diagnosis not present

## 2015-09-16 ENCOUNTER — Ambulatory Visit (INDEPENDENT_AMBULATORY_CARE_PROVIDER_SITE_OTHER): Payer: BC Managed Care – PPO

## 2015-09-16 DIAGNOSIS — J309 Allergic rhinitis, unspecified: Secondary | ICD-10-CM

## 2015-10-21 ENCOUNTER — Ambulatory Visit (INDEPENDENT_AMBULATORY_CARE_PROVIDER_SITE_OTHER): Payer: BC Managed Care – PPO | Admitting: *Deleted

## 2015-10-21 DIAGNOSIS — J309 Allergic rhinitis, unspecified: Secondary | ICD-10-CM

## 2015-11-12 ENCOUNTER — Other Ambulatory Visit: Payer: Self-pay

## 2015-11-12 DIAGNOSIS — Z1231 Encounter for screening mammogram for malignant neoplasm of breast: Secondary | ICD-10-CM

## 2015-11-28 ENCOUNTER — Ambulatory Visit (INDEPENDENT_AMBULATORY_CARE_PROVIDER_SITE_OTHER): Payer: BC Managed Care – PPO

## 2015-11-28 DIAGNOSIS — J309 Allergic rhinitis, unspecified: Secondary | ICD-10-CM | POA: Diagnosis not present

## 2015-12-03 DIAGNOSIS — I272 Pulmonary hypertension, unspecified: Secondary | ICD-10-CM | POA: Insufficient documentation

## 2015-12-03 HISTORY — DX: Pulmonary hypertension, unspecified: I27.20

## 2015-12-10 ENCOUNTER — Ambulatory Visit
Admission: RE | Admit: 2015-12-10 | Discharge: 2015-12-10 | Disposition: A | Discharge status: Home / Self Care | Payer: BC Managed Care – PPO | Source: Ambulatory Visit

## 2015-12-10 DIAGNOSIS — Z1231 Encounter for screening mammogram for malignant neoplasm of breast: Secondary | ICD-10-CM

## 2015-12-12 ENCOUNTER — Ambulatory Visit (INDEPENDENT_AMBULATORY_CARE_PROVIDER_SITE_OTHER): Payer: BC Managed Care – PPO | Admitting: *Deleted

## 2015-12-12 DIAGNOSIS — J309 Allergic rhinitis, unspecified: Secondary | ICD-10-CM

## 2015-12-30 ENCOUNTER — Ambulatory Visit (INDEPENDENT_AMBULATORY_CARE_PROVIDER_SITE_OTHER): Payer: BC Managed Care – PPO | Admitting: *Deleted

## 2015-12-30 DIAGNOSIS — J309 Allergic rhinitis, unspecified: Secondary | ICD-10-CM

## 2016-01-12 ENCOUNTER — Other Ambulatory Visit: Payer: Self-pay | Admitting: Nurse Practitioner

## 2016-01-20 ENCOUNTER — Ambulatory Visit (INDEPENDENT_AMBULATORY_CARE_PROVIDER_SITE_OTHER): Payer: BC Managed Care – PPO | Admitting: Allergy and Immunology

## 2016-01-20 ENCOUNTER — Encounter: Payer: Self-pay | Admitting: Allergy and Immunology

## 2016-01-20 VITALS — BP 128/80 | HR 80 | Resp 16

## 2016-01-20 DIAGNOSIS — K219 Gastro-esophageal reflux disease without esophagitis: Secondary | ICD-10-CM

## 2016-01-20 DIAGNOSIS — J019 Acute sinusitis, unspecified: Secondary | ICD-10-CM | POA: Diagnosis not present

## 2016-01-20 DIAGNOSIS — J387 Other diseases of larynx: Secondary | ICD-10-CM

## 2016-01-20 DIAGNOSIS — J309 Allergic rhinitis, unspecified: Secondary | ICD-10-CM

## 2016-01-20 DIAGNOSIS — H1010 Acute atopic conjunctivitis, unspecified eye: Secondary | ICD-10-CM | POA: Diagnosis not present
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
## 2016-01-20 NOTE — Patient Instructions (Signed)
  1. Nasal saline multiple times per day while "sick"  2. Mucinex DM and antihistamine while "sick"  3. Prednisone 10 mg one tablet once a day for 4 days only  4. Continue Nexium 40 mg one time per day  5. Continue immunotherapy and EpiPen  6. Further treatment?  7. Return in one year or earlier if problem

## 2016-01-22 ENCOUNTER — Other Ambulatory Visit: Payer: Self-pay | Admitting: *Deleted

## 2016-01-22 ENCOUNTER — Telehealth: Payer: Self-pay | Admitting: Allergy and Immunology

## 2016-01-22 MED ORDER — PREDNISONE 10 MG PO TABS: ORAL_TABLET | ORAL | Status: DC

## 2016-01-22 MED ORDER — AZITHROMYCIN 500 MG PO TABS: ORAL_TABLET | ORAL | Status: DC

## 2016-01-22 NOTE — Telephone Encounter (Signed)
Calling on update from Monday. Still very congested, very sore throat and runs a fever at night.   Call her before something is called in. She will let us know what pharmacy to use.

## 2016-01-22 NOTE — Telephone Encounter (Signed)
Please inform patient that we will send in azithromycin 500 one tablet one time per day and prednisone 10mg  one tablet one time a day for three days.

## 2016-01-22 NOTE — Telephone Encounter (Signed)
Patient informed and RX sent to [place].

## 2016-01-22 NOTE — Telephone Encounter (Signed)
AZITHROMYCIN AND PREDNISONE SENT TO [facility] PER DR. Neldon Mc

## 2016-01-28 ENCOUNTER — Telehealth: Payer: Self-pay | Admitting: Allergy and Immunology

## 2016-01-28 MED ORDER — HYDROCOD POLST-CPM POLST ER 10-8 MG/5ML PO SUER: 5.0000 mL | Freq: Two times a day (BID) | ORAL | Status: DC | PRN

## 2016-01-28 NOTE — Telephone Encounter (Signed)
Please advise 

## 2016-01-28 NOTE — Telephone Encounter (Signed)
Still coughing. Worse at night. Needs some advice on what she can take for coughing.  [facility] on [address]

## 2016-01-28 NOTE — Telephone Encounter (Signed)
Informed patient of medication and narcotic. She understood.

## 2016-01-28 NOTE — Telephone Encounter (Signed)
Can use Tussionex 2.5-5.0 mL's every 12 hours as needed for cough. Provide narcotic warning. 60 mL no refill

## 2016-02-05 ENCOUNTER — Other Ambulatory Visit: Payer: Self-pay | Admitting: Allergy and Immunology

## 2016-02-06 ENCOUNTER — Other Ambulatory Visit: Payer: Self-pay | Admitting: *Deleted

## 2016-02-06 MED ORDER — ESOMEPRAZOLE MAGNESIUM 40 MG PO CPDR
DELAYED_RELEASE_CAPSULE | ORAL | Status: DC
Start: 2016-02-06 — End: 2018-08-01

## 2016-02-07 ENCOUNTER — Ambulatory Visit (INDEPENDENT_AMBULATORY_CARE_PROVIDER_SITE_OTHER): Payer: BC Managed Care – PPO | Admitting: *Deleted

## 2016-02-07 DIAGNOSIS — J309 Allergic rhinitis, unspecified: Secondary | ICD-10-CM

## 2016-02-10 ENCOUNTER — Telehealth: Payer: Self-pay | Admitting: *Deleted

## 2016-02-10 NOTE — Telephone Encounter (Signed)
CALLED PT TO ADVISE HER THAT INS WILL NOT COVER BRAND NEXIUM ( THEY DENIED SAME) SINCE SHE CANNOT TAKE GENERIC PER DR KOZLOW CAN TRY DEXILANT 60MG  ONCE DAILY. PATIENT TO COME BY AND PICK UP SAMPLE.

## 2016-02-17 ENCOUNTER — Other Ambulatory Visit: Payer: Self-pay | Admitting: Nurse Practitioner

## 2016-02-17 NOTE — Telephone Encounter (Signed)
Needs AEX 

## 2016-02-17 NOTE — Telephone Encounter (Signed)
Medication refill request: Vitamin D, Ergocalciferol 50000 units Last AEX:  06/05/15 Next AEX: None Last MMG (if hormonal medication request): 12/10/15 BIRADS1 negative Refill authorized: 06/05/15 #30 w/3refills today please adivse

## 2016-03-02 ENCOUNTER — Ambulatory Visit (INDEPENDENT_AMBULATORY_CARE_PROVIDER_SITE_OTHER): Payer: BC Managed Care – PPO

## 2016-03-02 ENCOUNTER — Other Ambulatory Visit: Payer: Self-pay | Admitting: Nurse Practitioner

## 2016-03-02 DIAGNOSIS — J301 Allergic rhinitis due to pollen: Secondary | ICD-10-CM

## 2016-03-02 DIAGNOSIS — J309 Allergic rhinitis, unspecified: Secondary | ICD-10-CM

## 2016-03-02 NOTE — Telephone Encounter (Signed)
Medication refill request: COMBIPATCH 0.05-0.14mg /day Last AEX:  06/05/15 PG Next AEX: none Last MMG (if hormonal medication request): 12/10/15 BIRADS1 negative Refill authorized: 06/05/15 #24 w/ 4refills today #24 w/ 0 refills? Patient needs Next AEX scheduled?

## 2016-03-19 ENCOUNTER — Telehealth: Payer: Self-pay | Admitting: *Deleted

## 2016-03-19 NOTE — Telephone Encounter (Signed)
-----   Message from Kem Boroughs, [place] sent at 09/22/2015  2:52 PM EST ----- Can you please call her to schedule - wouldn't do this except she was so low At 17. ----- Message -----    From: SYSTEM    Sent: 09/22/2015  12:06 AM      To: Kem Boroughs, FNP

## 2016-03-19 NOTE — Telephone Encounter (Signed)
Return call to Stephanie. °

## 2016-03-19 NOTE — Telephone Encounter (Signed)
I have attempted to contact this patient by phone with the following results: left message to return my call on answering machine (mobile per DPR). [phone]

## 2016-03-20 NOTE — Telephone Encounter (Signed)
Returned call to patient who stated she was in the middle of an interview and would have to call me back later.

## 2016-03-31 DIAGNOSIS — J3089 Other allergic rhinitis: Secondary | ICD-10-CM | POA: Diagnosis not present

## 2016-04-01 DIAGNOSIS — J301 Allergic rhinitis due to pollen: Secondary | ICD-10-CM | POA: Diagnosis not present

## 2016-04-13 ENCOUNTER — Ambulatory Visit (INDEPENDENT_AMBULATORY_CARE_PROVIDER_SITE_OTHER): Payer: BC Managed Care – PPO | Admitting: *Deleted

## 2016-04-13 DIAGNOSIS — J309 Allergic rhinitis, unspecified: Secondary | ICD-10-CM | POA: Diagnosis not present

## 2016-04-13 NOTE — Telephone Encounter (Signed)
Patient states her endocrinology checked her Vit D about 2 months ago. She is taking Vit D supplements every day as directed by endo. Patient will wait another month and then call us back to schedule lab appt. Probably after July 4th.  Mrs Catherine [name] Encounter closed.

## 2016-04-27 ENCOUNTER — Ambulatory Visit (INDEPENDENT_AMBULATORY_CARE_PROVIDER_SITE_OTHER): Payer: BC Managed Care – PPO | Admitting: *Deleted

## 2016-04-27 DIAGNOSIS — J309 Allergic rhinitis, unspecified: Secondary | ICD-10-CM

## 2016-05-11 ENCOUNTER — Ambulatory Visit (INDEPENDENT_AMBULATORY_CARE_PROVIDER_SITE_OTHER): Payer: BC Managed Care – PPO

## 2016-05-11 DIAGNOSIS — J309 Allergic rhinitis, unspecified: Secondary | ICD-10-CM

## 2016-05-25 ENCOUNTER — Ambulatory Visit (INDEPENDENT_AMBULATORY_CARE_PROVIDER_SITE_OTHER): Payer: BC Managed Care – PPO

## 2016-05-25 DIAGNOSIS — J309 Allergic rhinitis, unspecified: Secondary | ICD-10-CM | POA: Diagnosis not present

## 2016-06-19 ENCOUNTER — Ambulatory Visit (INDEPENDENT_AMBULATORY_CARE_PROVIDER_SITE_OTHER): Payer: BC Managed Care – PPO | Admitting: *Deleted

## 2016-06-19 DIAGNOSIS — J309 Allergic rhinitis, unspecified: Secondary | ICD-10-CM | POA: Diagnosis not present

## 2016-06-22 DIAGNOSIS — M7731 Calcaneal spur, right foot: Secondary | ICD-10-CM | POA: Insufficient documentation

## 2016-06-22 DIAGNOSIS — M7661 Achilles tendinitis, right leg: Secondary | ICD-10-CM | POA: Insufficient documentation

## 2016-06-22 DIAGNOSIS — M6701 Short Achilles tendon (acquired), right ankle: Secondary | ICD-10-CM | POA: Insufficient documentation

## 2016-06-24 ENCOUNTER — Other Ambulatory Visit (INDEPENDENT_AMBULATORY_CARE_PROVIDER_SITE_OTHER): Payer: BC Managed Care – PPO

## 2016-06-24 DIAGNOSIS — Z0000 Encounter for general adult medical examination without abnormal findings: Secondary | ICD-10-CM

## 2016-06-24 LAB — CBC
HEMATOCRIT: 36 % (ref 35.0–45.0)
HEMOGLOBIN: 11.8 g/dL (ref 11.7–15.5)
MCH: 29.4 pg (ref 27.0–33.0)
MCHC: 32.8 g/dL (ref 32.0–36.0)
MCV: 89.6 fL (ref 80.0–100.0)
MPV: 10.4 fL (ref 7.5–12.5)
Platelets: 197 10*3/uL (ref 140–400)
RBC: 4.02 MIL/uL (ref 3.80–5.10)
RDW: 13.7 % (ref 11.0–15.0)
WBC: 6.2 10*3/uL (ref 3.8–10.8)

## 2016-06-24 LAB — COMPREHENSIVE METABOLIC PANEL
ALBUMIN: 3.7 g/dL (ref 3.6–5.1)
ALT: 20 U/L (ref 6–29)
AST: 20 U/L (ref 10–35)
Alkaline Phosphatase: 55 U/L (ref 33–130)
BILIRUBIN TOTAL: 0.5 mg/dL (ref 0.2–1.2)
BUN: 16 mg/dL (ref 7–25)
CALCIUM: 9 mg/dL (ref 8.6–10.4)
CHLORIDE: 108 mmol/L (ref 98–110)
CO2: 24 mmol/L (ref 20–31)
CREATININE: 0.73 mg/dL (ref 0.50–0.99)
Glucose, Bld: 84 mg/dL (ref 65–99)
Potassium: 4.3 mmol/L (ref 3.5–5.3)
SODIUM: 140 mmol/L (ref 135–146)
TOTAL PROTEIN: 6.5 g/dL (ref 6.1–8.1)

## 2016-06-24 LAB — LIPID PANEL
CHOLESTEROL: 218 mg/dL — AB (ref 125–200)
HDL: 45 mg/dL — AB (ref >=46)
LDL Cholesterol: 138 mg/dL — ABNORMAL HIGH (ref <130)
Total CHOL/HDL Ratio: 4.8 Ratio (ref <=5.0)
Triglycerides: 177 mg/dL — ABNORMAL HIGH (ref <150)
VLDL: 35 mg/dL — ABNORMAL HIGH (ref <30)

## 2016-06-24 LAB — TSH: TSH: 2.4 m[IU]/L

## 2016-06-24 LAB — HIV ANTIBODY (ROUTINE TESTING W REFLEX): HIV 1&2 Ab, 4th Generation: NONREACTIVE

## 2016-06-25 LAB — VITAMIN D 25 HYDROXY (VIT D DEFICIENCY, FRACTURES): Vit D, 25-Hydroxy: 28 ng/mL — ABNORMAL LOW (ref 30–100)

## 2016-06-25 LAB — HEMOGLOBIN A1C
HEMOGLOBIN A1C: 5.8 % — AB (ref <5.7)
MEAN PLASMA GLUCOSE: 120 mg/dL

## 2016-06-25 LAB — HEPATITIS C ANTIBODY: HCV AB: NEGATIVE

## 2016-06-29 ENCOUNTER — Encounter: Payer: Self-pay | Admitting: Nurse Practitioner

## 2016-06-29 ENCOUNTER — Ambulatory Visit (INDEPENDENT_AMBULATORY_CARE_PROVIDER_SITE_OTHER): Payer: BC Managed Care – PPO | Admitting: Nurse Practitioner

## 2016-06-29 VITALS — BP 130/76 | HR 76 | Ht 66.0 in | Wt 281.0 lb

## 2016-06-29 DIAGNOSIS — Z01419 Encounter for gynecological examination (general) (routine) without abnormal findings: Secondary | ICD-10-CM

## 2016-06-29 DIAGNOSIS — Z0000 Encounter for general adult medical examination without abnormal findings: Secondary | ICD-10-CM | POA: Diagnosis not present

## 2016-06-29 DIAGNOSIS — Z1211 Encounter for screening for malignant neoplasm of colon: Secondary | ICD-10-CM | POA: Diagnosis not present

## 2016-06-29 LAB — POCT URINALYSIS DIPSTICK
Bilirubin, UA: NEGATIVE
Glucose, UA: NEGATIVE
KETONES UA: NEGATIVE
Leukocytes, UA: NEGATIVE
Nitrite, UA: NEGATIVE
PH UA: 6
PROTEIN UA: NEGATIVE
RBC UA: NEGATIVE
UROBILINOGEN UA: NEGATIVE

## 2016-06-29 MED ORDER — ESTRADIOL-NORETHINDRONE ACET 0.05-0.14 MG/DAY TD PTTW: MEDICATED_PATCH | TRANSDERMAL | 4 refills | Status: DC

## 2016-06-29 MED ORDER — VALACYCLOVIR HCL 1 G PO TABS: 1000.0000 mg | ORAL_TABLET | Freq: Two times a day (BID) | ORAL | 12 refills | Status: DC

## 2016-06-29 NOTE — Progress Notes (Signed)
Patient ID: Catherine [name], female   DOB: [birth year], 65 y.o.   MRN: [id number]  65 y.o. G0P0000 Single  Caucasian Fe here for annual exam.  She is doing well on Combipatch without any vaginal bleeding.  She has tried tapering - but did not do well with 1/2 patch.  She is trying to spread out the patch.   Mother passed in December at age 29.  No new health issues this past.  Echo is due in a few weeks for evaluation of mild pulmonary HTN.  Sees cardio in [place] - part of [facility].  She also had fasting labs done last week and this is reviewed with pt. - copy is given for her PCP and cardio.  Patient's last menstrual period was 11/02/2002 (approximate).          Sexually active: Yes.    The current method of family planning is post menopausal status.    Exercising: Yes.    walking dogs Smoker:  no  Health Maintenance: Pap: 06/05/15, Negative with neg HR HPV MMG: 12/10/15, 3D, Bi-Rads 1: Negative Colonoscopy: 06/2008 Normal- Repeat in 10 years  BMD:02/19/14 T Score, 1.7 Spine /  -0.3 Right Femur Neck TDaP: Not recommended = allergic reaction Shingles: Never, declined Pneumonia: Not indicated due to age Hep C and HIV: 06/24/16  Labs: 06/24/16 in [facility]  Urine: Negative    reports that she has never smoked. She has never used smokeless tobacco. She reports that she does not drink alcohol or use drugs.  Past Medical History:  Diagnosis Date  . Anxiety    situational  . Depression    situational  . Fibromyalgia   . GERD (gastroesophageal reflux disease)   . Hypertension   . MVP (mitral valve prolapse)   . Obesity   . Perimenopausal     Past Surgical History:  Procedure Laterality Date  . KNEE SURGERY Right 06/1994    Current Outpatient Prescriptions  Medication Sig Dispense Refill  . calcium carbonate (OS-CAL - DOSED IN MG OF ELEMENTAL CALCIUM) 1250 MG tablet Take 1 tablet by mouth daily with breakfast.    . candesartan (ATACAND) 4 MG tablet Take 4 mg by mouth daily.    . cetirizine (ZYRTEC)  10 MG tablet Take 10 mg by mouth daily.    . Cholecalciferol (VITAMIN D) 2000 units CAPS Take 1 capsule by mouth daily.    . COMBIPATCH 0.05-0.14 MG/DAY PLACE 1 PATCH ONTO SKIN TWICE A WEEK 24 patch 1  . EPINEPHrine (EPIPEN 2-PAK) 0.3 mg/0.3 mL IJ SOAJ injection 0.3 mLs as directed.    Marland Kitchen esomeprazole (NEXIUM) 40 MG capsule TAKE ONE CAPSULE TWICE DAILY 60 capsule 5  . estradiol-norethindrone (COMBIPATCH) 0.05-0.14 MG/DAY PLACE 1 PATCH ONTO SKIN TWICE A WEEK 24 patch 4  . mometasone (NASONEX) 50 MCG/ACT nasal spray Place 2 sprays into the nose as needed.     Marland Kitchen omega-3 acid ethyl esters (LOVAZA) 1 G capsule Take 4 g by mouth daily.     . valACYclovir (VALTREX) 1000 MG tablet Take 1 tablet (1,000 mg total) by mouth 2 (two) times daily. 30 tablet 12   No current facility-administered medications for this visit.     Family History  Problem Relation Age of Onset  . Diabetes Mother   . Hypertension Mother   . Allergic rhinitis Mother   . Hypertension Father   . Infertility Sister   . Cancer Paternal Grandmother     pancreatic cancer  . Asthma Maternal Grandfather  ROS:  Pertinent items are noted in HPI.  Otherwise, a comprehensive ROS was negative.  Exam:   BP 130/76 (BP Location: Right Arm, Patient Position: Sitting, Cuff Size: Large)   Pulse 76   Ht 5\' 6"  (1.676 m)   Wt 281 lb (127.5 kg)   LMP 11/02/2002 (Approximate)   BMI 45.35 kg/m  Height: 5\' 6"  (167.6 cm) Ht Readings from Last 3 Encounters:  06/29/16 5\' 6"  (1.676 m)  06/05/15 5\' 5"  (1.651 m)  05/29/14 5\' 5"  (1.651 m)    General appearance: alert, cooperative and appears stated age Head: Normocephalic, without obvious abnormality, atraumatic Neck: no adenopathy, supple, symmetrical, trachea midline and thyroid normal to inspection and palpation Lungs: clear to auscultation bilaterally Breasts: normal appearance, no masses or tenderness Heart: regular rate and rhythm Abdomen: soft, non-tender; no masses,  no  organomegaly Extremities: extremities normal, atraumatic, no cyanosis or edema Skin: Skin color, texture, turgor normal. No rashes or lesions Lymph nodes: Cervical, supraclavicular, and axillary nodes normal. No abnormal inguinal nodes palpated Neurologic: Grossly normal   Pelvic: External genitalia:  no lesions              Urethra:  normal appearing urethra with no masses, tenderness or lesions              Bartholin's and Skene's: normal                 Vagina: normal appearing vagina with normal color and discharge, no lesions              Cervix: anteverted              Pap taken: No. Bimanual Exam:  Uterus:  normal size, contour, position, consistency, mobility, non-tender              Adnexa: no mass, fullness, tenderness               Rectovaginal: Confirms               Anus:  normal sphincter tone, no lesions  Chaperone present: yes  A:  Well Woman with normal exam  Postmenopausal on HRT History of anxiety and depression History of MVP with SBE prophylaxis. HTN History of seasonal allergies    P:   Reviewed health and wellness pertinent to exam  Pap smear as above  Mammogram is due 12/2016  IFOB is given  Refill on Combipatch and will continue and try tapering  Counseled with risk of DVT, CVA, cancer, etc.  Counseled on breast self exam, mammography screening, use and side effects of HRT, adequate intake of calcium and vitamin D, diet and exercise, Kegel's exercises return annually or prn  An After Visit Summary was printed and given to the patient.

## 2016-06-29 NOTE — Patient Instructions (Signed)

## 2016-07-01 NOTE — Progress Notes (Signed)
Reviewed personally.  M. Suzanne Alaija Ruble, MD.  

## 2016-07-07 LAB — FECAL OCCULT BLOOD, IMMUNOCHEMICAL: IMMUNOLOGICAL FECAL OCCULT BLOOD TEST: NEGATIVE

## 2016-07-07 NOTE — Addendum Note (Signed)
Addended by: Remigio Eisenmenger on: 07/07/2016 11:10 AM   Modules accepted: Orders

## 2016-07-17 ENCOUNTER — Ambulatory Visit (INDEPENDENT_AMBULATORY_CARE_PROVIDER_SITE_OTHER): Payer: BC Managed Care – PPO | Admitting: *Deleted

## 2016-07-17 DIAGNOSIS — J309 Allergic rhinitis, unspecified: Secondary | ICD-10-CM | POA: Diagnosis not present

## 2016-08-03 ENCOUNTER — Other Ambulatory Visit: Payer: Self-pay | Admitting: Allergy and Immunology

## 2016-08-03 MED ORDER — OLOPATADINE HCL 0.6 % NA SOLN: NASAL | 2 refills | Status: DC

## 2016-08-03 NOTE — Telephone Encounter (Signed)
Please provide patient with olopatadine nasal spray 2 sprays each nostril twice a day

## 2016-08-03 NOTE — Telephone Encounter (Signed)
Patient advised of rx sent

## 2016-08-03 NOTE — Telephone Encounter (Signed)
Catherine [name] wants to know if we will call in olopatadine 665 MCG nasal spray.  [facility]

## 2016-08-17 ENCOUNTER — Ambulatory Visit (INDEPENDENT_AMBULATORY_CARE_PROVIDER_SITE_OTHER): Payer: BC Managed Care – PPO | Admitting: *Deleted

## 2016-08-17 DIAGNOSIS — J309 Allergic rhinitis, unspecified: Secondary | ICD-10-CM

## 2016-08-24 ENCOUNTER — Ambulatory Visit (INDEPENDENT_AMBULATORY_CARE_PROVIDER_SITE_OTHER): Payer: BC Managed Care – PPO | Admitting: *Deleted

## 2016-08-24 DIAGNOSIS — Z23 Encounter for immunization: Secondary | ICD-10-CM | POA: Diagnosis not present

## 2016-09-11 ENCOUNTER — Ambulatory Visit (INDEPENDENT_AMBULATORY_CARE_PROVIDER_SITE_OTHER): Payer: BC Managed Care – PPO | Admitting: *Deleted

## 2016-09-11 DIAGNOSIS — J309 Allergic rhinitis, unspecified: Secondary | ICD-10-CM | POA: Diagnosis not present

## 2016-10-12 ENCOUNTER — Ambulatory Visit (INDEPENDENT_AMBULATORY_CARE_PROVIDER_SITE_OTHER): Payer: BC Managed Care – PPO | Admitting: *Deleted

## 2016-10-12 DIAGNOSIS — J309 Allergic rhinitis, unspecified: Secondary | ICD-10-CM

## 2016-11-09 ENCOUNTER — Ambulatory Visit (INDEPENDENT_AMBULATORY_CARE_PROVIDER_SITE_OTHER): Payer: BC Managed Care – PPO | Admitting: *Deleted

## 2016-11-09 DIAGNOSIS — J309 Allergic rhinitis, unspecified: Secondary | ICD-10-CM

## 2016-12-07 ENCOUNTER — Ambulatory Visit (INDEPENDENT_AMBULATORY_CARE_PROVIDER_SITE_OTHER): Payer: BC Managed Care – PPO | Admitting: *Deleted

## 2016-12-07 DIAGNOSIS — J309 Allergic rhinitis, unspecified: Secondary | ICD-10-CM

## 2017-01-15 ENCOUNTER — Ambulatory Visit (INDEPENDENT_AMBULATORY_CARE_PROVIDER_SITE_OTHER): Payer: BC Managed Care – PPO | Admitting: *Deleted

## 2017-01-15 DIAGNOSIS — J309 Allergic rhinitis, unspecified: Secondary | ICD-10-CM

## 2017-01-15 MED ORDER — EPINEPHRINE 0.3 MG/0.3ML IJ SOAJ: 0.3000 mg | Freq: Once | INTRAMUSCULAR | 0 refills | Status: AC

## 2017-01-15 NOTE — Addendum Note (Signed)
Addended by: Carin Hock on: 01/15/2017 11:53 AM   Modules accepted: Orders

## 2017-01-18 ENCOUNTER — Ambulatory Visit (INDEPENDENT_AMBULATORY_CARE_PROVIDER_SITE_OTHER): Payer: BC Managed Care – PPO | Admitting: *Deleted

## 2017-01-18 DIAGNOSIS — J309 Allergic rhinitis, unspecified: Secondary | ICD-10-CM | POA: Diagnosis not present

## 2017-01-28 ENCOUNTER — Ambulatory Visit (INDEPENDENT_AMBULATORY_CARE_PROVIDER_SITE_OTHER): Payer: BC Managed Care – PPO | Admitting: *Deleted

## 2017-01-28 DIAGNOSIS — J309 Allergic rhinitis, unspecified: Secondary | ICD-10-CM | POA: Diagnosis not present

## 2017-01-29 ENCOUNTER — Other Ambulatory Visit: Payer: Self-pay | Admitting: Nurse Practitioner

## 2017-02-17 DIAGNOSIS — J301 Allergic rhinitis due to pollen: Secondary | ICD-10-CM | POA: Diagnosis not present

## 2017-02-17 NOTE — Progress Notes (Signed)
Vials to be made on 02/18/17

## 2017-02-17 NOTE — Addendum Note (Signed)
Addended by: Orpah Greek D on: 02/17/2017 12:20 PM   Modules accepted: Orders

## 2017-02-18 DIAGNOSIS — J3089 Other allergic rhinitis: Secondary | ICD-10-CM | POA: Diagnosis not present

## 2017-02-19 ENCOUNTER — Ambulatory Visit (INDEPENDENT_AMBULATORY_CARE_PROVIDER_SITE_OTHER): Payer: BC Managed Care – PPO | Admitting: *Deleted

## 2017-02-19 DIAGNOSIS — J309 Allergic rhinitis, unspecified: Secondary | ICD-10-CM | POA: Diagnosis not present

## 2017-03-01 ENCOUNTER — Ambulatory Visit (INDEPENDENT_AMBULATORY_CARE_PROVIDER_SITE_OTHER): Payer: BC Managed Care – PPO | Admitting: *Deleted

## 2017-03-01 DIAGNOSIS — J309 Allergic rhinitis, unspecified: Secondary | ICD-10-CM | POA: Diagnosis not present

## 2017-03-08 ENCOUNTER — Other Ambulatory Visit: Payer: Self-pay | Admitting: Nurse Practitioner

## 2017-03-08 DIAGNOSIS — Z1231 Encounter for screening mammogram for malignant neoplasm of breast: Secondary | ICD-10-CM

## 2017-03-19 ENCOUNTER — Ambulatory Visit (INDEPENDENT_AMBULATORY_CARE_PROVIDER_SITE_OTHER): Payer: Medicare Other | Admitting: *Deleted

## 2017-03-19 DIAGNOSIS — J309 Allergic rhinitis, unspecified: Secondary | ICD-10-CM

## 2017-04-08 ENCOUNTER — Ambulatory Visit (INDEPENDENT_AMBULATORY_CARE_PROVIDER_SITE_OTHER): Payer: Medicare Other | Admitting: *Deleted

## 2017-04-08 DIAGNOSIS — J309 Allergic rhinitis, unspecified: Secondary | ICD-10-CM

## 2017-04-15 ENCOUNTER — Ambulatory Visit
Admission: RE | Admit: 2017-04-15 | Discharge: 2017-04-15 | Disposition: A | Discharge status: Home / Self Care | Payer: Medicare Other | Source: Ambulatory Visit | Attending: Nurse Practitioner | Admitting: Nurse Practitioner

## 2017-04-15 DIAGNOSIS — Z1231 Encounter for screening mammogram for malignant neoplasm of breast: Secondary | ICD-10-CM

## 2017-04-29 ENCOUNTER — Ambulatory Visit (INDEPENDENT_AMBULATORY_CARE_PROVIDER_SITE_OTHER): Payer: Medicare Other | Admitting: *Deleted

## 2017-04-29 DIAGNOSIS — J309 Allergic rhinitis, unspecified: Secondary | ICD-10-CM

## 2017-05-17 ENCOUNTER — Ambulatory Visit (INDEPENDENT_AMBULATORY_CARE_PROVIDER_SITE_OTHER): Payer: Medicare Other

## 2017-05-17 DIAGNOSIS — J309 Allergic rhinitis, unspecified: Secondary | ICD-10-CM

## 2017-05-24 ENCOUNTER — Encounter: Payer: Self-pay | Admitting: Cardiology

## 2017-05-24 ENCOUNTER — Ambulatory Visit (INDEPENDENT_AMBULATORY_CARE_PROVIDER_SITE_OTHER): Payer: Medicare Other | Admitting: Cardiology

## 2017-05-24 VITALS — BP 118/66 | HR 64 | Resp 14 | Ht 65.0 in | Wt 281.1 lb

## 2017-05-24 DIAGNOSIS — G4733 Obstructive sleep apnea (adult) (pediatric): Secondary | ICD-10-CM

## 2017-05-24 DIAGNOSIS — I10 Essential (primary) hypertension: Secondary | ICD-10-CM | POA: Diagnosis not present

## 2017-05-24 DIAGNOSIS — I059 Rheumatic mitral valve disease, unspecified: Secondary | ICD-10-CM

## 2017-05-24 DIAGNOSIS — I270 Primary pulmonary hypertension: Secondary | ICD-10-CM | POA: Diagnosis not present

## 2017-05-24 HISTORY — DX: Primary pulmonary hypertension: I27.0

## 2017-05-24 NOTE — Patient Instructions (Signed)
Medication Instructions:  Your physician recommends that you continue on your current medications as directed. Please refer to the Current Medication list given to you today.  Labwork: None   Testing/Procedures: None  Follow-Up: Your physician recommends that you schedule a follow-up appointment in: 1 year with Dr. Agustin Cree.   Any Other Special Instructions Will Be Listed Below (If Applicable).     If you need a refill on your cardiac medications before your next appointment, please call your pharmacy.

## 2017-05-24 NOTE — Progress Notes (Signed)
Cardiology Office Note:    Date:  05/24/2017   ID:  [name], Catherine [name] Oct 13, 1951, MRN [id number]  PCP:  Shellia Carwin, PA-C  Cardiologist:  Jenne Campus, MD    Referring MD: Shellia Carwin, PA-C   Chief Complaint  Patient presents with  . Follow-up  Hypertension, pulmonary hypertension Chief complaint is doing well  History of Present Illness:    Catherine [name] is a 66 y.o. female  hypertension that seems to be well-controlled as well as pulmonary hypertension which is probably related to sleep apnea which is poorly controlled. We had a long discussion about that. She agreed to see pulmonary for potential adjustment of her mask that she cannot were .  Past Medical History:  Diagnosis Date  . Anxiety    situational  . Depression    situational  . Fibromyalgia   . GERD (gastroesophageal reflux disease)   . Hypertension   . MVP (mitral valve prolapse)   . Obesity   . Perimenopausal     Past Surgical History:  Procedure Laterality Date  . KNEE SURGERY Right 06/1994    Current Medications: Current Meds  Medication Sig  . calcium carbonate (OS-CAL - DOSED IN MG OF ELEMENTAL CALCIUM) 1250 MG tablet Take 1 tablet by mouth daily with breakfast.  . candesartan (ATACAND) 4 MG tablet Take 4 mg by mouth daily.  . cetirizine (ZYRTEC) 10 MG tablet Take 10 mg by mouth daily.  . Cholecalciferol (VITAMIN D) 2000 units CAPS Take 1 capsule by mouth daily.  Marland Kitchen esomeprazole (NEXIUM) 40 MG capsule TAKE ONE CAPSULE TWICE DAILY  . estradiol-norethindrone (COMBIPATCH) 0.05-0.14 MG/DAY PLACE 1 PATCH ONTO SKIN TWICE A WEEK  . ezetimibe (ZETIA) 10 MG tablet Take 10 mg by mouth daily.  Vanessa Kick Ethyl 1 g CAPS Take 1 g by mouth 3 (three) times daily.  . mometasone (NASONEX) 50 MCG/ACT nasal spray Place 2 sprays into the nose as needed.   . Olopatadine HCl 0.6 % SOLN Use 2 sprays each nostril twice daily as directed for congestion or drainage  . omega-3 acid ethyl esters (LOVAZA) 1 G capsule  Take 4 g by mouth daily.   . valACYclovir (VALTREX) 1000 MG tablet Take 1 tablet (1,000 mg total) by mouth 2 (two) times daily.     Allergies:   Zinacef [cefuroxime in sterile water]; Adhesive [tape]; Augmentin [amoxicillin-pot clavulanate]; Avelox [moxifloxacin hcl in nacl]; Desipramine hcl; Diclofenac sodium; Iodine; Rosuvastatin; and Sulfa antibiotics   Social History   Social History  . Marital status: Single    Spouse name: N/A  . Number of children: N/A  . Years of education: N/A   Social History Main Topics  . Smoking status: Never Smoker  . Smokeless tobacco: Never Used  . Alcohol use No  . Drug use: No  . Sexual activity: Yes    Birth control/ protection: Post-menopausal   Other Topics Concern  . None   Social History Narrative  . None     Family History: The patient's family history includes Allergic rhinitis in her mother; Asthma in her maternal grandfather; Cancer in her paternal grandmother; Diabetes in her mother; Hypertension in her father and mother; Infertility in her sister; Kidney failure in her mother. ROS:   Please see the history of present illness.    All 14 point review of systems negative except as described per history of present illness  EKGs/Labs/Other Studies Reviewed:      Recent Labs: 06/24/2016: ALT 20; BUN 16;  Creat 0.73; Hemoglobin 11.8; Platelets 197; Potassium 4.3; Sodium 140; TSH 2.40  Recent Lipid Panel    Component Value Date/Time   CHOL 218 (H) 06/24/2016 1342   TRIG 177 (H) 06/24/2016 1342   HDL 45 (L) 06/24/2016 1342   CHOLHDL 4.8 06/24/2016 1342   VLDL 35 (H) 06/24/2016 1342   LDLCALC 138 (H) 06/24/2016 1342    Physical Exam:    VS:  BP 118/66   Pulse 64   Resp 14   Ht 5\' 5"  (1.651 m)   Wt 281 lb 1.9 oz (127.5 kg)   LMP 11/02/2002 (Approximate)   BMI 46.78 kg/m     Wt Readings from Last 3 Encounters:  05/24/17 281 lb 1.9 oz (127.5 kg)  06/29/16 281 lb (127.5 kg)  06/05/15 264 lb (119.7 kg)     GEN:  Well  nourished, well developed in no acute distress HEENT: Normal NECK: No JVD; No carotid bruits LYMPHATICS: No lymphadenopathy CARDIAC: RRR, no murmurs, no rubs, no gallops RESPIRATORY:  Clear to auscultation without rales, wheezing or rhonchi  ABDOMEN: Soft, non-tender, non-distended MUSCULOSKELETAL:  No edema; No deformity  SKIN: Warm and dry LOWER EXTREMITIES: no swelling NEUROLOGIC:  Alert and oriented x 3 PSYCHIATRIC:  Normal affect   ASSESSMENT:    1. Essential (primary) hypertension   2. Mitral valve disease   3. Obstructive apnea   4. Pulmonary hypertension, primary ([place])    PLAN:    In order of problems listed above:  Essential hypertension: Blood pressure well controlled we'll continue present management. Mitral valve disease: Last echocardiogram did not show critical lesion.  Obstructive sleep apnea: We will schedule her to see pulmonary. Pulmonary hypertension: Most likely related to obstructive sleep apnea.  Medication Adjustments/Labs and Tests Ordered: Current medicines are reviewed at length with the patient today.  Concerns regarding medicines are outlined above.  No orders of the defined types were placed in this encounter.  Medication changes: No orders of the defined types were placed in this encounter.   Signed, Park Liter, MD, [facility] 05/24/2017 2:39 PM    [place]

## 2017-05-25 ENCOUNTER — Telehealth: Payer: Self-pay | Admitting: *Deleted

## 2017-05-26 NOTE — Telephone Encounter (Signed)
Spoke with patient. Advised of notification letter received from [facility], per Dr. Elza Rafter request. Advised patient plan will cover temporary 30 day supply of Combipatch, list of covered alternatives provided.   Patient states she has been on Combipatch since 2014, will pay out of pocket if needed, does not want to change medication. Tried estradiol oral tablets in past, reports were not as effective.   Advised patient RN can submit PA for Combipatch 0.05-0.14mg  through [email or web address], can take up to 72 hours for response of approval/denial, will call with response once received. Patient verbalizes understanding and is agreeable.

## 2017-05-31 ENCOUNTER — Ambulatory Visit (INDEPENDENT_AMBULATORY_CARE_PROVIDER_SITE_OTHER): Payer: Medicare Other | Admitting: *Deleted

## 2017-05-31 DIAGNOSIS — J309 Allergic rhinitis, unspecified: Secondary | ICD-10-CM

## 2017-06-01 ENCOUNTER — Ambulatory Visit (INDEPENDENT_AMBULATORY_CARE_PROVIDER_SITE_OTHER): Payer: Medicare Other | Admitting: Pulmonary Disease

## 2017-06-01 ENCOUNTER — Encounter: Payer: Self-pay | Admitting: Pulmonary Disease

## 2017-06-01 VITALS — BP 138/84 | HR 90 | Ht 65.0 in | Wt 281.8 lb

## 2017-06-01 DIAGNOSIS — Z6841 Body Mass Index (BMI) 40.0 and over, adult: Secondary | ICD-10-CM

## 2017-06-01 DIAGNOSIS — G4733 Obstructive sleep apnea (adult) (pediatric): Secondary | ICD-10-CM

## 2017-06-01 NOTE — Telephone Encounter (Signed)
PA for Combipatch 0.05-0.14 mg/day submitted via [email or web address].   Key: [id number] - PA Case ID: [id number]

## 2017-06-01 NOTE — Patient Instructions (Signed)
Will arrange for home sleep study  Will call to schedule follow up after home sleep study reviewed

## 2017-06-01 NOTE — Progress Notes (Signed)
   Subjective:    Patient ID: Catherine [name], female    DOB: [birth year], 66 y.o.   MRN: [id number]  HPI    Review of Systems  Constitutional: Negative.  Negative for fever and unexpected weight change.  HENT: Negative.  Negative for congestion, dental problem, ear pain, nosebleeds, postnasal drip, rhinorrhea, sinus pressure, sneezing, sore throat and trouble swallowing.   Eyes: Negative.  Negative for redness and itching.  Respiratory: Negative.  Negative for cough, chest tightness, shortness of breath and wheezing.   Cardiovascular: Positive for leg swelling. Negative for palpitations.  Gastrointestinal: Negative.  Negative for nausea and vomiting.  Endocrine: Negative.   Genitourinary: Negative.  Negative for dysuria.  Musculoskeletal: Positive for joint swelling.  Skin: Negative.  Negative for rash.  Allergic/Immunologic: Negative.  Negative for environmental allergies, food allergies and immunocompromised state.  Neurological: Negative.  Negative for headaches.  Hematological: Negative.  Does not bruise/bleed easily.  Psychiatric/Behavioral: Negative.  Negative for dysphoric mood. The patient is not nervous/anxious.        Objective:   Physical Exam        Assessment & Plan:
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
## 2017-06-02 NOTE — Telephone Encounter (Signed)
Call to patient, advised PA for combipatch approved to 11/01/17. Scheduled for AEX 07/23/17 with Dr. Sabra Heck. Patient verbalizes understanding and is agreeable.   Approved on July 31   Request Reference Number: [id number]. [facility] DIS .05/.14 is approved through 11/01/2017. For further questions, call [phone].  Routing to provider for final review. Patient is agreeable to disposition. Will close encounter.   Cc: Dr. Sabra Heck

## 2017-06-21 ENCOUNTER — Ambulatory Visit (INDEPENDENT_AMBULATORY_CARE_PROVIDER_SITE_OTHER): Payer: Medicare Other | Admitting: *Deleted

## 2017-06-21 DIAGNOSIS — J309 Allergic rhinitis, unspecified: Secondary | ICD-10-CM

## 2017-06-21 DIAGNOSIS — G4733 Obstructive sleep apnea (adult) (pediatric): Secondary | ICD-10-CM

## 2017-06-23 ENCOUNTER — Telehealth: Payer: Self-pay | Admitting: Pulmonary Disease

## 2017-06-23 DIAGNOSIS — G4733 Obstructive sleep apnea (adult) (pediatric): Secondary | ICD-10-CM

## 2017-06-23 NOTE — Telephone Encounter (Signed)
HST 06/21/17 >> AHI 60, SaO2 low 66%   Will have my nurse inform pt that sleep study shows severe sleep apnea.  She needs to have in lab CPAP titration study.  If she is agreeable, then please enter order.

## 2017-06-24 NOTE — Telephone Encounter (Signed)
Spoke with the pt and notified of results/recs per VS and she verbalized understanding  Order was sent to [facility] for CPAP titration

## 2017-06-25 DIAGNOSIS — G4733 Obstructive sleep apnea (adult) (pediatric): Secondary | ICD-10-CM | POA: Diagnosis not present

## 2017-06-28 ENCOUNTER — Other Ambulatory Visit: Payer: Self-pay | Admitting: *Deleted

## 2017-06-28 DIAGNOSIS — G4733 Obstructive sleep apnea (adult) (pediatric): Secondary | ICD-10-CM

## 2017-06-29 ENCOUNTER — Ambulatory Visit (HOSPITAL_BASED_OUTPATIENT_CLINIC_OR_DEPARTMENT_OTHER): Payer: Medicare Other | Attending: Pulmonary Disease | Admitting: Pulmonary Disease

## 2017-06-29 DIAGNOSIS — G4731 Primary central sleep apnea: Secondary | ICD-10-CM | POA: Diagnosis not present

## 2017-06-29 DIAGNOSIS — G4733 Obstructive sleep apnea (adult) (pediatric): Secondary | ICD-10-CM | POA: Diagnosis not present

## 2017-06-29 DIAGNOSIS — G4739 Other sleep apnea: Secondary | ICD-10-CM

## 2017-06-30 ENCOUNTER — Ambulatory Visit: Payer: BC Managed Care – PPO | Admitting: Nurse Practitioner

## 2017-06-30 ENCOUNTER — Telehealth: Payer: Self-pay | Admitting: Pulmonary Disease

## 2017-06-30 DIAGNOSIS — G4731 Primary central sleep apnea: Secondary | ICD-10-CM | POA: Diagnosis not present

## 2017-06-30 DIAGNOSIS — G4739 Other sleep apnea: Secondary | ICD-10-CM | POA: Insufficient documentation

## 2017-06-30 DIAGNOSIS — G4733 Obstructive sleep apnea (adult) (pediatric): Secondary | ICD-10-CM

## 2017-06-30 HISTORY — DX: Other sleep apnea: G47.39

## 2017-06-30 NOTE — Progress Notes (Signed)
Patient Name: Catherine [name], Catherine [name] Date: 06/29/2017 Gender: Female D.O.B: 1950/11/18 Age (years): 58 Referring Provider: Chesley Mires MD, ABSM Height (inches): 65 Interpreting Physician: Chesley Mires MD, ABSM Weight (lbs): 260 RPSGT: Zadie Rhine BMI: 43 MRN: [id number] Neck Size: 15.00  CLINICAL INFORMATION The patient is referred for a CPAP titration to treat sleep apnea.  Date of NPSG, Split Night or HST: 06/21/17, AHI 60 and SaO2 low 66%.  SLEEP STUDY TECHNIQUE As per the AASM Manual for the Scoring of Sleep and Associated Events v2.3 (April 2016) with a hypopnea requiring 4% desaturations.  The channels recorded and monitored were frontal, central and occipital EEG, electrooculogram (EOG), submentalis EMG (chin), nasal and oral airflow, thoracic and abdominal wall motion, anterior tibialis EMG, snore microphone, electrocardiogram, and pulse oximetry. Continuous positive airway pressure (CPAP) was initiated at the beginning of the study and titrated to treat sleep-disordered breathing.  MEDICATIONS Medications self-administered by patient taken the night of the study : N/A  TECHNICIAN COMMENTS Comments added by technician: NO RESTROOM VISTED  Comments added by scorer: N/A  RESPIRATORY PARAMETERS She was started on CPAP 5 and increased to 17 cm H2O.  With higher pressures she developed central apneas.  It was difficult to determine an optimal fixed pressure setting for her.  SLEEP ARCHITECTURE The study was initiated at 10:32:50 PM and ended at 4:52:06 AM.  Sleep onset time was 22.1 minutes and the sleep efficiency was 87.9%. The total sleep time was 333.5 minutes.  The patient spent 1.20% of the night in stage N1 sleep, 14.24% in stage N2 sleep, 44.98% in stage N3 and 39.58% in REM.Stage REM latency was 43.0 minutes  Wake after sleep onset was 23.7. Alpha intrusion was absent. Supine sleep was 42.04%.  CARDIAC DATA The 2 lead EKG demonstrated sinus rhythm. The mean heart rate  was 61.73 beats per minute. Other EKG findings include: None.  LEG MOVEMENT DATA The total Periodic Limb Movements of Sleep (PLMS) were 0. The PLMS index was 0.00. A PLMS index of <15 is considered normal in adults.  IMPRESSIONS - She had improved control of obstructive sleep apnea with higher CPAP settings, but then developed central apneas with higher settings.  DIAGNOSIS - Obstructive Sleep Apnea (G47.33) - Treatment Emergent Central Sleep Apnea (G47.31)  RECOMMENDATIONS - She should be tried on auto CPAP with range of 10 to 20 cm H2O. - She was fitted with a Small size Resmed Full Face Mask AirFit F20 mask and heated humidification.  [Electronically signed] 06/30/2017 03:27 PM  Chesley Mires MD, [place], American Board of Sleep Medicine   NPI: [id number]

## 2017-06-30 NOTE — Procedures (Signed)
   Patient Name: Catherine [name], [place] Date: 06/29/2017 Gender: Female D.O.B: 1951-05-11 Age (years): 31 Referring Provider: Chesley Mires MD, ABSM Height (inches): 65 Interpreting Physician: Chesley Mires MD, ABSM Weight (lbs): 260 RPSGT: Zadie Rhine BMI: 43 MRN: [id number] Neck Size: 15.00  CLINICAL INFORMATION The patient is referred for a CPAP titration to treat sleep apnea.  Date of NPSG, Split Night or HST: 06/21/17, AHI 60 and SaO2 low 66%.  SLEEP STUDY TECHNIQUE As per the AASM Manual for the Scoring of Sleep and Associated Events v2.3 (April 2016) with a hypopnea requiring 4% desaturations.  The channels recorded and monitored were frontal, central and occipital EEG, electrooculogram (EOG), submentalis EMG (chin), nasal and oral airflow, thoracic and abdominal wall motion, anterior tibialis EMG, snore microphone, electrocardiogram, and pulse oximetry. Continuous positive airway pressure (CPAP) was initiated at the beginning of the study and titrated to treat sleep-disordered breathing.  MEDICATIONS Medications self-administered by patient taken the night of the study : N/A  TECHNICIAN COMMENTS Comments added by technician: NO RESTROOM VISTED  Comments added by scorer: N/A  RESPIRATORY PARAMETERS She was started on CPAP 5 and increased to 17 cm H2O.  With higher pressures she developed central apneas.  It was difficult to determine an optimal fixed pressure setting for her.  SLEEP ARCHITECTURE The study was initiated at 10:32:50 PM and ended at 4:52:06 AM.  Sleep onset time was 22.1 minutes and the sleep efficiency was 87.9%. The total sleep time was 333.5 minutes.  The patient spent 1.20% of the night in stage N1 sleep, 14.24% in stage N2 sleep, 44.98% in stage N3 and 39.58% in REM.Stage REM latency was 43.0 minutes  Wake after sleep onset was 23.7. Alpha intrusion was absent. Supine sleep was 42.04%.  CARDIAC DATA The 2 lead EKG demonstrated sinus rhythm. The mean  heart rate was 61.73 beats per minute. Other EKG findings include: None.  LEG MOVEMENT DATA The total Periodic Limb Movements of Sleep (PLMS) were 0. The PLMS index was 0.00. A PLMS index of <15 is considered normal in adults.  IMPRESSIONS - She had improved control of obstructive sleep apnea with higher CPAP settings, but then developed central apneas with higher settings.  DIAGNOSIS - Obstructive Sleep Apnea (G47.33) - Treatment Emergent Central Sleep Apnea (G47.31)  RECOMMENDATIONS - She should be tried on auto CPAP with range of 10 to 20 cm H2O. - She was fitted with a Small size Resmed Full Face Mask AirFit F20 mask and heated humidification.  [Electronically signed] 06/30/2017 03:27 PM  Chesley Mires MD, [place], American Board of Sleep Medicine   NPI: [id number]

## 2017-06-30 NOTE — Telephone Encounter (Signed)
CPAP titration 06/29/17 >> difficult titration, central apneas with higher pressures.   Please arrange for patient to be started on auto CPAP range 10 to 20 cm H2O with heated humidity and mask of choice.  Please arrange for ROV with me or NP in 2 months after getting CPAP.

## 2017-07-01 ENCOUNTER — Other Ambulatory Visit: Payer: Self-pay | Admitting: Allergy and Immunology

## 2017-07-02 NOTE — Telephone Encounter (Signed)
LM x 1 

## 2017-07-07 NOTE — Telephone Encounter (Signed)
Patient is returning phone call [phone].

## 2017-07-07 NOTE — Telephone Encounter (Signed)
Spoke with pt. She is aware of her CPAP titration results. Order has been placed for CPAP. Nothing further was needed.

## 2017-07-08 ENCOUNTER — Ambulatory Visit (INDEPENDENT_AMBULATORY_CARE_PROVIDER_SITE_OTHER): Payer: Medicare Other | Admitting: *Deleted

## 2017-07-08 DIAGNOSIS — J309 Allergic rhinitis, unspecified: Secondary | ICD-10-CM | POA: Diagnosis not present

## 2017-07-12 ENCOUNTER — Other Ambulatory Visit: Payer: Self-pay

## 2017-07-12 NOTE — Telephone Encounter (Signed)
Medication refill request: COMBIPATCH Last AEX:  06/29/16 PG Next AEX: 07/23/17 SM Last MMG (if hormonal medication request): 04/15/17 BIRADS 1 negative/density b Refill authorized: 06/29/16 #24 w/4 refills; today please advise, patient has appointment coming up soon

## 2017-07-14 MED ORDER — ESTRADIOL-NORETHINDRONE ACET 0.05-0.14 MG/DAY TD PTTW: MEDICATED_PATCH | TRANSDERMAL | 0 refills | Status: DC

## 2017-07-19 ENCOUNTER — Ambulatory Visit (INDEPENDENT_AMBULATORY_CARE_PROVIDER_SITE_OTHER): Payer: Medicare Other | Admitting: Allergy and Immunology

## 2017-07-19 ENCOUNTER — Encounter: Payer: Self-pay | Admitting: Allergy and Immunology

## 2017-07-19 VITALS — BP 120/70 | HR 64 | Resp 16

## 2017-07-19 DIAGNOSIS — K219 Gastro-esophageal reflux disease without esophagitis: Secondary | ICD-10-CM

## 2017-07-19 DIAGNOSIS — H6983 Other specified disorders of Eustachian tube, bilateral: Secondary | ICD-10-CM

## 2017-07-19 DIAGNOSIS — J3089 Other allergic rhinitis: Secondary | ICD-10-CM

## 2017-07-19 DIAGNOSIS — G4733 Obstructive sleep apnea (adult) (pediatric): Secondary | ICD-10-CM

## 2017-07-19 MED ORDER — NASACORT ALLERGY 24HR 55 MCG/ACT NA AERO: INHALATION_SPRAY | NASAL | 0 refills | Status: DC

## 2017-07-19 MED ORDER — MONTELUKAST SODIUM 10 MG PO TABS: 10.0000 mg | ORAL_TABLET | Freq: Every day | ORAL | 5 refills | Status: DC

## 2017-07-19 MED ORDER — OLOPATADINE HCL 0.6 % NA SOLN: NASAL | 11 refills | Status: AC

## 2017-07-19 NOTE — Patient Instructions (Signed)
  1. Nasal saline multiple times per day while "sick"  2. Treat and prevent inflammation:   A. OTC Nasacort - one spray each nostril one time per day  B. Montelukast 10mg  one tablet one time per day  3. Continue Nexium 40 mg one time per day  4. Continue immunotherapy and EpiPen  5. If needed:   A. olopatadine nasal - 1-2 sprays each nostril one-2 times per day  6. Return in December 2018  7. Obtain fall flu vaccine
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
## 2017-07-23 ENCOUNTER — Ambulatory Visit: Payer: Medicare Other | Admitting: Obstetrics & Gynecology

## 2017-07-26 NOTE — Progress Notes (Signed)
66 y.o. G0P0000 Single Caucasian F here for annual exam.  No vaginal bleeding.  No significant gynecological issues.    Major issue for pt is allergies issues with URIs.  Does received allergy injections.  Has sleep apnea and uses a CPAP.    PCP:  Dr. Valora Piccolo.    Patient's last menstrual period was 11/02/2002 (approximate).          Sexually active: Yes.    The current method of family planning is post menopausal status.    Exercising: Yes.    walking Smoker:  no  Health Maintenance: Pap:  06/05/15 negative, HR HPV negative, 04/19/12 negative, HR HPV negative   History of abnormal Pap:  no MMG:  04/19/17 BIRADS 1 negative  Colonoscopy:  8/09 normal- repeat 10 years  BMD:   02/19/14 normal  TDaP:  Allergic  Pneumonia vaccine(s):  Never.  D/w pt as I think this is really important for her history.   Zostavax:   never Hep C testing: 06/25/15 negative  Screening Labs: Dr. Meredith Pel, Hb today: same   reports that she has never smoked. She has never used smokeless tobacco. She reports that she does not drink alcohol or use drugs.  Past Medical History:  Diagnosis Date  . Anxiety    situational  . Depression    situational  . Fibromyalgia   . GERD (gastroesophageal reflux disease)   . Hypertension   . MVP (mitral valve prolapse)   . Obesity   . Perimenopausal   . Sleep apnea    uses CPAP    Past Surgical History:  Procedure Laterality Date  . KNEE SURGERY Right 06/1994    Current Outpatient Prescriptions  Medication Sig Dispense Refill  . calcium carbonate (OS-CAL - DOSED IN MG OF ELEMENTAL CALCIUM) 1250 MG tablet Take 1 tablet by mouth daily with breakfast.    . candesartan (ATACAND) 8 MG tablet Take 8 mg by mouth.    . cetirizine (ZYRTEC) 10 MG tablet Take 10 mg by mouth daily.    . Cholecalciferol (VITAMIN D) 2000 units CAPS Take 2 capsules by mouth daily.     Marland Kitchen esomeprazole (NEXIUM) 40 MG capsule TAKE ONE CAPSULE TWICE DAILY (Patient taking differently: daily. TAKE ONE CAPSULE  TWICE DAILY) 60 capsule 5  . estradiol-norethindrone (COMBIPATCH) 0.05-0.14 MG/DAY PLACE 1 PATCH ONTO SKIN TWICE A WEEK 24 patch 0  . Icosapent Ethyl 1 g CAPS Take by mouth.    . montelukast (SINGULAIR) 10 MG tablet Take 1 tablet (10 mg total) by mouth at bedtime. (Patient taking differently: Take 5 mg by mouth at bedtime. ) 30 tablet 5  . NASACORT ALLERGY 24HR 55 MCG/ACT AERO nasal inhaler Use one spray in each nostril once daily as directed. 1 Inhaler 0  . Olopatadine HCl 0.6 % SOLN Can use one to two sprays in each nostril one to two times daily if needed. 30.5 g 11  . valACYclovir (VALTREX) 1000 MG tablet Take 1 tablet (1,000 mg total) by mouth 2 (two) times daily. (Patient taking differently: Take 1,000 mg by mouth as needed. ) 30 tablet 12  . ezetimibe (ZETIA) 10 MG tablet Take 10 mg by mouth daily.     No current facility-administered medications for this visit.     Family History  Problem Relation Age of Onset  . Diabetes Mother   . Hypertension Mother   . Allergic rhinitis Mother   . Kidney failure Mother   . Hypertension Father   . Infertility Sister   .  Cancer Paternal Grandmother        pancreatic cancer  . Asthma Maternal Grandfather     ROS:  Pertinent items are noted in HPI.  Otherwise, a comprehensive ROS was negative.  Exam:   BP (!) 144/66 (BP Location: Right Arm, Patient Position: Sitting, Cuff Size: Large)   Pulse 70   Resp 16   Ht 5\' 5"  (1.651 m)   Wt 281 lb 8 oz (127.7 kg)   LMP 11/02/2002 (Approximate)   BMI 46.84 kg/m     Height: 5\' 5"  (165.1 cm)  Ht Readings from Last 3 Encounters:  07/27/17 5\' 5"  (1.651 m)  06/29/17 5\' 5"  (1.651 m)  06/01/17 5\' 5"  (1.651 m)    General appearance: alert, cooperative and appears stated age Head: Normocephalic, without obvious abnormality, atraumatic Neck: no adenopathy, supple, symmetrical, trachea midline and thyroid normal to inspection and palpation Lungs: clear to auscultation bilaterally Breasts: normal  appearance, no masses or tenderness Heart: regular rate and rhythm Abdomen: soft, non-tender; bowel sounds normal; no masses,  no organomegaly Extremities: extremities normal, atraumatic, no cyanosis or edema Skin: Skin color, texture, turgor normal. No rashes or lesions Lymph nodes: Cervical, supraclavicular, and axillary nodes normal. No abnormal inguinal nodes palpated Neurologic: Grossly normal   Pelvic: External genitalia:  no lesions              Urethra:  normal appearing urethra with no masses, tenderness or lesions              Bartholins and Skenes: normal                 Vagina: normal appearing vagina with normal color and discharge, no lesions              Cervix: no lesions              Pap taken: Yes.   Bimanual Exam:  Uterus:  normal size, contour, position, consistency, mobility, non-tender              Adnexa: normal adnexa and no mass, fullness, tenderness               Rectovaginal: Confirms               Anus:  normal sphincter tone, no lesions  Chaperone was present for exam.  A:  Well Woman with normal exam PMP, no HRT H/o anxiety and depression H/O MVP Hypertension Allergies  P:   Mammogram guidelines reviewed pap smear obtained today rx for Valtrex 1000mg  2 tabs po bid x 1 day.   Combipatch 0.05/0.14mg  twice weekly.  #8/13RF.  Risks reviewed.  Pt is adamant about continuing and not changing dosage.   Shingrix vaccination discussed.  Pt declines. Return annually or prn

## 2017-07-27 ENCOUNTER — Encounter: Payer: Self-pay | Admitting: Obstetrics & Gynecology

## 2017-07-27 ENCOUNTER — Other Ambulatory Visit (HOSPITAL_COMMUNITY)
Admission: RE | Admit: 2017-07-27 | Discharge: 2017-07-27 | Disposition: A | Discharge status: Home / Self Care | Payer: Medicare Other | Source: Ambulatory Visit | Attending: Obstetrics & Gynecology | Admitting: Obstetrics & Gynecology

## 2017-07-27 ENCOUNTER — Ambulatory Visit (INDEPENDENT_AMBULATORY_CARE_PROVIDER_SITE_OTHER): Payer: Medicare Other | Admitting: Obstetrics & Gynecology

## 2017-07-27 VITALS — BP 144/66 | HR 70 | Resp 16 | Ht 65.0 in | Wt 281.5 lb

## 2017-07-27 DIAGNOSIS — Z124 Encounter for screening for malignant neoplasm of cervix: Secondary | ICD-10-CM | POA: Diagnosis not present

## 2017-07-27 DIAGNOSIS — Z01419 Encounter for gynecological examination (general) (routine) without abnormal findings: Secondary | ICD-10-CM

## 2017-07-27 MED ORDER — VALACYCLOVIR HCL 1 G PO TABS: ORAL_TABLET | ORAL | 3 refills | Status: DC

## 2017-07-27 MED ORDER — ESTRADIOL-NORETHINDRONE ACET 0.05-0.14 MG/DAY TD PTTW: MEDICATED_PATCH | TRANSDERMAL | 13 refills | Status: DC

## 2017-07-28 LAB — CYTOLOGY - PAP: DIAGNOSIS: NEGATIVE

## 2017-07-30 ENCOUNTER — Ambulatory Visit (INDEPENDENT_AMBULATORY_CARE_PROVIDER_SITE_OTHER): Payer: Medicare Other | Admitting: *Deleted

## 2017-07-30 DIAGNOSIS — J309 Allergic rhinitis, unspecified: Secondary | ICD-10-CM

## 2017-08-03 NOTE — Progress Notes (Signed)
VIALS EXP 08-03-18

## 2017-08-04 DIAGNOSIS — J3089 Other allergic rhinitis: Secondary | ICD-10-CM | POA: Diagnosis not present

## 2017-08-05 DIAGNOSIS — J301 Allergic rhinitis due to pollen: Secondary | ICD-10-CM

## 2017-08-12 ENCOUNTER — Ambulatory Visit (INDEPENDENT_AMBULATORY_CARE_PROVIDER_SITE_OTHER): Payer: Medicare Other | Admitting: *Deleted

## 2017-08-12 DIAGNOSIS — J309 Allergic rhinitis, unspecified: Secondary | ICD-10-CM

## 2017-08-16 ENCOUNTER — Other Ambulatory Visit: Payer: Self-pay

## 2017-08-16 MED ORDER — EZETIMIBE 10 MG PO TABS: 10.0000 mg | ORAL_TABLET | Freq: Every day | ORAL | 2 refills | Status: DC

## 2017-09-17 ENCOUNTER — Ambulatory Visit (INDEPENDENT_AMBULATORY_CARE_PROVIDER_SITE_OTHER): Payer: Medicare Other | Admitting: *Deleted

## 2017-09-17 DIAGNOSIS — J309 Allergic rhinitis, unspecified: Secondary | ICD-10-CM | POA: Diagnosis not present

## 2017-09-28 ENCOUNTER — Encounter: Payer: Self-pay | Admitting: Pulmonary Disease

## 2017-09-28 ENCOUNTER — Ambulatory Visit: Payer: Medicare Other | Admitting: Pulmonary Disease

## 2017-09-28 VITALS — BP 130/82 | HR 69 | Ht 66.0 in | Wt 289.0 lb

## 2017-09-28 DIAGNOSIS — Z9989 Dependence on other enabling machines and devices: Secondary | ICD-10-CM | POA: Diagnosis not present

## 2017-09-28 DIAGNOSIS — G4733 Obstructive sleep apnea (adult) (pediatric): Secondary | ICD-10-CM

## 2017-09-28 DIAGNOSIS — Z6841 Body Mass Index (BMI) 40.0 and over, adult: Secondary | ICD-10-CM | POA: Diagnosis not present

## 2017-09-28 NOTE — Progress Notes (Signed)
Current Outpatient Medications on File Prior to Visit  Medication Sig  . calcium carbonate (OS-CAL - DOSED IN MG OF ELEMENTAL CALCIUM) 1250 MG tablet Take 1 tablet by mouth daily with breakfast.  . candesartan (ATACAND) 8 MG tablet Take 8 mg by mouth.  . cetirizine (ZYRTEC) 10 MG tablet Take 10 mg by mouth daily.  . Cholecalciferol (VITAMIN D) 2000 units CAPS Take 2 capsules by mouth daily.   Marland Kitchen esomeprazole (NEXIUM) 40 MG capsule TAKE ONE CAPSULE TWICE DAILY (Patient taking differently: daily. TAKE ONE CAPSULE TWICE DAILY)  . estradiol-norethindrone (COMBIPATCH) 0.05-0.14 MG/DAY PLACE 1 PATCH ONTO SKIN TWICE A WEEK  . ezetimibe (ZETIA) 10 MG tablet Take 1 tablet (10 mg total) by mouth daily.  Catherine [name] Ethyl 1 g CAPS Take by mouth.  . montelukast (SINGULAIR) 10 MG tablet Take 1 tablet (10 mg total) by mouth at bedtime. (Patient taking differently: Take 5 mg by mouth at bedtime. )  . NASACORT ALLERGY 24HR 55 MCG/ACT AERO nasal inhaler Use one spray in each nostril once daily as directed.  . Olopatadine HCl 0.6 % SOLN Can use one to two sprays in each nostril one to two times daily if needed.  . valACYclovir (VALTREX) 1000 MG tablet Take 1 tablet as directed   No current facility-administered medications on file prior to visit.      Chief Complaint  Patient presents with  . Follow-up    Pt is doing well with cpap machine, having issues with the mask. She wakes up due to the mask not fitting well.      Sleep tests HST 06/21/17 >> AHI 60, SaO2 low 66% Auto CPAP 08/28/17 to 11/125/18 >> used on 30 of 30 nights with average 7 hrs 49 min.  Average AHI 0.2 with median CPAP 13 and 95 th percentile CPAP 17 cm H2O  Past medical history Anxiety, depression, fibromyalgia, GERD, HTN, MVP  Past surgical history, Family history, Social history, Allergies all reviewed.  Vital Signs BP 130/82 (BP Location: Left Arm, Cuff Size: Normal)   Pulse 69   Ht 5\' 6"  (1.676 m)   Wt 289 lb (131.1 kg)    LMP 11/02/2002 (Approximate)   SpO2 96%   BMI 46.65 kg/m   History of Present Illness Catherine [name] is a 67 y.o. female with obstructive sleep apnea.  Since her last visit she had a sleep study that showed severe sleep apnea.  She was started on auto CPAP.  She is sleeping much better and feels she can breath deeper at night.  She has a full face mask.  The mask shifts up into her mouth.  She hasn't tried any other masks.  She was having trouble with sinus and ear congestion.  She had her allergy regimen adjusted by Dr. Neldon Mc and has improved.  Physical Exam  General - No distress ENT - No sinus tenderness, no oral exudate, no LAN, mild septal deviation, TM clear, MP 3, 2+ tonsils Cardiac - s1s2 regular, no murmur Chest - No wheeze/rales/dullness Back - No focal tenderness Abd - Soft, non-tender Ext - No edema Neuro - Normal strength Skin - No rashes Psych - normal mood, and behavior   Assessment/Plan  Obstructive sleep apnea. - she is compliant with CPAP and reports benefit - continue auto CPAP range 10 to 20 cm H2O - she will look up different CPAP mask options on line and email when she finds one she would like to switch to  Obesity. - discussed importance  of weight loss   Patient Instructions  Call or email when you find a CPAP mask you would like to switch to  Follow up in 1 year   Chesley Mires, MD [employer] Pager:  [phone] 09/28/2017, 5:11 PM

## 2017-09-28 NOTE — Patient Instructions (Signed)
Call or email when you find a CPAP mask you would like to switch to  Follow up in 1 year

## 2017-10-08 ENCOUNTER — Other Ambulatory Visit: Payer: Self-pay | Admitting: Obstetrics & Gynecology

## 2017-10-20 ENCOUNTER — Ambulatory Visit: Payer: Medicare Other | Admitting: Allergy and Immunology

## 2017-10-20 ENCOUNTER — Encounter: Payer: Self-pay | Admitting: Allergy and Immunology

## 2017-10-20 VITALS — BP 152/80 | HR 70 | Resp 18

## 2017-10-20 DIAGNOSIS — J309 Allergic rhinitis, unspecified: Secondary | ICD-10-CM | POA: Diagnosis not present

## 2017-10-20 DIAGNOSIS — R0600 Dyspnea, unspecified: Secondary | ICD-10-CM | POA: Diagnosis not present

## 2017-10-20 DIAGNOSIS — J3089 Other allergic rhinitis: Secondary | ICD-10-CM

## 2017-10-20 DIAGNOSIS — K219 Gastro-esophageal reflux disease without esophagitis: Secondary | ICD-10-CM | POA: Diagnosis not present

## 2017-10-20 NOTE — Patient Instructions (Addendum)
  1.  Continue to Treat and prevent inflammation:   A. OTC Nasacort - one spray each nostril one time per day  B. Montelukast 10mg  one tablet one time per day  2. Continue Nexium 40 mg one time per day  3. Continue immunotherapy and EpiPen  4. If needed:   A. olopatadine nasal - 1-2 sprays each nostril one-2 times per day  B. OTC antihistamine  5. Keep visit with Dr. Agustin Cree. Echo?  6. Return in one year or earlier if problem
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
## 2017-10-21 ENCOUNTER — Encounter: Payer: Self-pay | Admitting: Allergy and Immunology

## 2017-10-21 ENCOUNTER — Telehealth: Payer: Self-pay

## 2017-10-21 NOTE — Telephone Encounter (Signed)
PA for Combipatch submitted via Cover My Meds. Reference Number: [id number]   [facility] DIS .05/.14 Key: [place].

## 2017-10-22 NOTE — Addendum Note (Signed)
Addended by: Lucrezia Starch I on: 10/22/2017 07:32 AM   Modules accepted: Orders

## 2017-11-10 ENCOUNTER — Ambulatory Visit: Payer: Medicare Other | Admitting: Cardiology

## 2017-11-10 ENCOUNTER — Encounter: Payer: Self-pay | Admitting: Cardiology

## 2017-11-10 VITALS — BP 130/68 | HR 65 | Ht 65.0 in | Wt 292.1 lb

## 2017-11-10 DIAGNOSIS — I059 Rheumatic mitral valve disease, unspecified: Secondary | ICD-10-CM

## 2017-11-10 DIAGNOSIS — I10 Essential (primary) hypertension: Secondary | ICD-10-CM

## 2017-11-10 DIAGNOSIS — I272 Pulmonary hypertension, unspecified: Secondary | ICD-10-CM | POA: Diagnosis not present

## 2017-11-10 DIAGNOSIS — E782 Mixed hyperlipidemia: Secondary | ICD-10-CM | POA: Diagnosis not present

## 2017-11-10 NOTE — Patient Instructions (Signed)
Medication Instructions:  Your physician recommends that you continue on your current medications as directed. Please refer to the Current Medication list given to you today.  Labwork: Your physician recommends that you have the following labs drawn: Lipid panel  Testing/Procedures: Your physician has requested that you have an echocardiogram. Echocardiography is a painless test that uses sound waves to create images of your heart. It provides your doctor with information about the size and shape of your heart and how well your heart's chambers and valves are working. This procedure takes approximately one hour. There are no restrictions for this procedure.  Follow-Up: Your physician recommends that you schedule a follow-up appointment in: 6 months  Any Other Special Instructions Will Be Listed Below (If Applicable).     If you need a refill on your cardiac medications before your next appointment, please call your pharmacy.   [place], RN, BSN   Echocardiogram An echocardiogram, or echocardiography, uses sound waves (ultrasound) to produce an image of your heart. The echocardiogram is simple, painless, obtained within a short period of time, and offers valuable information to your health care provider. The images from an echocardiogram can provide information such as:  Evidence of coronary artery disease (CAD).  Heart size.  Heart muscle function.  Heart valve function.  Aneurysm detection.  Evidence of a past heart attack.  Fluid buildup around the heart.  Heart muscle thickening.  Assess heart valve function.  Tell a health care provider about:  Any allergies you have.  All medicines you are taking, including vitamins, herbs, eye drops, creams, and over-the-counter medicines.  Any problems you or family members have had with anesthetic medicines.  Any blood disorders you have.  Any surgeries you have had.  Any medical conditions you  have.  Whether you are pregnant or may be pregnant. What happens before the procedure? No special preparation is needed. Eat and drink normally. What happens during the procedure?  In order to produce an image of your heart, gel will be applied to your chest and a wand-like tool (transducer) will be moved over your chest. The gel will help transmit the sound waves from the transducer. The sound waves will harmlessly bounce off your heart to allow the heart images to be captured in real-time motion. These images will then be recorded.  You may need an IV to receive a medicine that improves the quality of the pictures. What happens after the procedure? You may return to your normal schedule including diet, activities, and medicines, unless your health care provider tells you otherwise. This information is not intended to replace advice given to you by your health care provider. Make sure you discuss any questions you have with your health care provider. Document Released: 10/16/2000 Document Revised: 06/06/2016 Document Reviewed: 06/26/2013 Elsevier Interactive Patient Education  2017 [employer].

## 2017-11-10 NOTE — Progress Notes (Signed)
Cardiology Office Note:    Date:  11/10/2017   ID:  GARY BULTMAN, DOB Jun 07, 1951, MRN [id number]  PCP:  Lilian Coma., MD  Cardiologist:  Jenne Campus, MD    Referring MD: Lilian Coma., MD   Chief Complaint  Patient presents with  . Follow up from Sleep Study    On CPAP  . Shortness of Breath  Doing better  History of Present Illness:    Catherine [name] is a 67 y.o. female with mild pulmonary hypertension I thinking was that it was related to sleep apnea.  Finally it looks like sleep apnea has been managed excellently.  It is time to repeat her echocardiogram which I will do.  She described to have some wheezes she actually went to Dr. Theora Gianotti who gave her some sprays she started feeling better but at the same time she started having some strange sensation like shortness of breath at rest eventually ended up stopping all those medications now she is doing better.  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  No chest pain no tightness no squeezing or pressure no burning in the chest no palpitations  Past Medical History:  Diagnosis Date  . Anxiety    situational  . Depression    situational  . Fibromyalgia   . GERD (gastroesophageal reflux disease)   . Hypertension   . MVP (mitral valve prolapse)   . Obesity   . Perimenopausal   . Sleep apnea    uses CPAP    Past Surgical History:  Procedure Laterality Date  . KNEE SURGERY Right 06/1994    Current Medications: Current Meds  Medication Sig  . calcium carbonate (OS-CAL - DOSED IN MG OF ELEMENTAL CALCIUM) 1250 MG tablet Take 1 tablet by mouth daily with breakfast.  . candesartan (ATACAND) 8 MG tablet Take 8 mg by mouth.  . cetirizine (ZYRTEC) 10 MG tablet Take 10 mg by mouth daily.  . Cholecalciferol (VITAMIN D) 2000 units CAPS Take 2 capsules by mouth daily.   Marland Kitchen esomeprazole (NEXIUM) 40  MG capsule TAKE ONE CAPSULE TWICE DAILY (Patient taking differently: daily. TAKE ONE CAPSULE TWICE DAILY)  . estradiol-norethindrone (COMBIPATCH) 0.05-0.14 MG/DAY PLACE 1 PATCH ONTO SKIN TWICE A WEEK  . ezetimibe (ZETIA) 10 MG tablet Take 1 tablet (10 mg total) by mouth daily.  Vanessa Kick Ethyl 1 g CAPS Take by mouth.  . Olopatadine HCl 0.6 % SOLN Can use one to two sprays in each nostril one to two times daily if needed.  . valACYclovir (VALTREX) 1000 MG tablet Take 1 tablet as directed     Allergies:   Zinacef [cefuroxime in sterile water]; Adhesive [tape]; Augmentin [amoxicillin-pot clavulanate]; Avelox [moxifloxacin hcl in nacl]; Desipramine hcl; Diclofenac sodium; Iodine; Rosuvastatin; and Sulfa antibiotics   Social History   Socioeconomic History  . Marital status: Single    Spouse name: None  . Number of children: None  . Years of education: None  . Highest education level: None  Social Needs  . Financial resource strain: None  . Food insecurity - worry: None  . Food insecurity - inability: None  . Transportation needs - medical: None  . Transportation needs - non-medical: None  Occupational History  . None  Tobacco Use  . Smoking status: Never Smoker  . Smokeless tobacco: Never Used  Substance and Sexual Activity  . Alcohol use: No  . Drug use: No  . Sexual activity: Yes    Birth control/protection: Post-menopausal  Other Topics Concern  . None  Social History Narrative  . None     Family History: The patient's family history includes Allergic rhinitis in her mother; Asthma in her maternal grandfather; Cancer in her paternal grandmother; Diabetes in her mother; Hypertension in her father and mother; Infertility in her sister; Kidney failure in her mother. ROS:   Please see the history of present illness.    All 14 point review of systems negative except as described per history of present illness  EKGs/Labs/Other Studies Reviewed:      Recent Labs: No  results found for requested labs within last 8760 hours.  Recent Lipid Panel    Component Value Date/Time   CHOL 218 (H) 06/24/2016 1342   TRIG 177 (H) 06/24/2016 1342   HDL 45 (L) 06/24/2016 1342   CHOLHDL 4.8 06/24/2016 1342   VLDL 35 (H) 06/24/2016 1342   LDLCALC 138 (H) 06/24/2016 1342    Physical Exam:    VS:  BP 130/68   Pulse 65   Ht 5\' 5"  (1.651 m)   Wt 292 lb 1.9 oz (132.5 kg)   LMP 11/02/2002 (Approximate)   SpO2 99%   BMI 48.61 kg/m     Wt Readings from Last 3 Encounters:  11/10/17 292 lb 1.9 oz (132.5 kg)  09/28/17 289 lb (131.1 kg)  07/27/17 281 lb 8 oz (127.7 kg)     GEN:  Well nourished, well developed in no acute distress HEENT: Normal NECK: No JVD; No carotid bruits LYMPHATICS: No lymphadenopathy CARDIAC: RRR, no murmurs, no rubs, no gallops RESPIRATORY:  Clear to auscultation without rales, wheezing or rhonchi  ABDOMEN: Soft, non-tender, non-distended MUSCULOSKELETAL:  No edema; No deformity  SKIN: Warm and dry LOWER EXTREMITIES: no swelling NEUROLOGIC:  Alert and oriented x 3 PSYCHIATRIC:  Normal affect   ASSESSMENT:    1. Essential (primary) hypertension   2. Mild pulmonary hypertension ([place])   3. Mitral valve disease   4. Mixed hyperlipidemia    PLAN:    In order of problems listed above:  1. Essential hypertension: Blood pressure appears to be well controlled continue present management. 2. Pulmonary hypertension felt to be related to sleep apnea.  Sleep apnea finally has been managed appropriately we will recheck her pulmonary pressure by doing echocardiogram. 3. Mild mitral valve prolapse: Echocardiogram will be done to reassess the situation. 4. Dyslipidemia she is only on Zetia which I will continue we will check a fasting lipid profile.   Medication Adjustments/Labs and Tests Ordered: Current medicines are reviewed at length with the patient today.  Concerns regarding medicines are outlined above.  No orders of the defined types  were placed in this encounter.  Medication changes: No orders of the defined types were placed in this encounter.   Signed, Park Liter, MD, [facility] 11/10/2017 9:38 AM    [place]

## 2017-11-11 NOTE — Telephone Encounter (Signed)
PA for Combipatch was unable to be reviewed at end of year. Patients updated insurance had to be applied. Submitted new PA today for Combipatch via Cover My [employer] (Key: [id number]) - [id number]. Will await response.

## 2017-11-11 NOTE — Telephone Encounter (Signed)
CoverMy Meds message-This medication or product was previously approved on [id number] From- 2017-11-02-2018-11-01. You will be able to fill a prescription for this medication at your pharmacy. If your pharmacy has questions regarding the processing of your prescription, please have them call the [facility] pharmacy help desk at ([phone].  Spoke with patient who is aware of approval and has filled rx.  Routing to provider for final review. Patient agreeable to disposition. Will close encounter.

## 2017-11-12 NOTE — Addendum Note (Signed)
Addended by: Jenne Campus on: 11/12/2017 09:35 AM   Modules accepted: Orders

## 2017-11-13 LAB — BASIC METABOLIC PANEL
BUN/Creatinine Ratio: 20 (ref 12–28)
BUN: 17 mg/dL (ref 8–27)
CO2: 23 mmol/L (ref 20–29)
Calcium: 9.7 mg/dL (ref 8.7–10.3)
Chloride: 104 mmol/L (ref 96–106)
Creatinine, Ser: 0.83 mg/dL (ref 0.57–1.00)
GFR calc Af Amer: 86 mL/min/{1.73_m2} (ref >59)
GFR calc non Af Amer: 74 mL/min/{1.73_m2} (ref >59)
Glucose: 99 mg/dL (ref 65–99)
POTASSIUM: 4.6 mmol/L (ref 3.5–5.2)
SODIUM: 143 mmol/L (ref 134–144)

## 2017-11-13 LAB — LIPID PANEL
CHOLESTEROL TOTAL: 179 mg/dL (ref 100–199)
Chol/HDL Ratio: 4.1 ratio (ref 0.0–4.4)
HDL: 44 mg/dL (ref >39)
LDL CALC: 99 mg/dL (ref 0–99)
Triglycerides: 182 mg/dL — ABNORMAL HIGH (ref 0–149)
VLDL CHOLESTEROL CAL: 36 mg/dL (ref 5–40)

## 2017-11-15 ENCOUNTER — Ambulatory Visit (INDEPENDENT_AMBULATORY_CARE_PROVIDER_SITE_OTHER): Payer: Medicare Other

## 2017-11-15 DIAGNOSIS — J309 Allergic rhinitis, unspecified: Secondary | ICD-10-CM

## 2017-11-16 ENCOUNTER — Other Ambulatory Visit: Payer: Self-pay

## 2017-11-16 MED ORDER — ICOSAPENT ETHYL 1 G PO CAPS: 1.0000 g | ORAL_CAPSULE | Freq: Three times a day (TID) | ORAL | 6 refills | Status: DC

## 2017-11-25 ENCOUNTER — Ambulatory Visit (HOSPITAL_BASED_OUTPATIENT_CLINIC_OR_DEPARTMENT_OTHER)
Admission: RE | Admit: 2017-11-25 | Discharge: 2017-11-25 | Disposition: A | Discharge status: Home / Self Care | Payer: Medicare Other | Source: Ambulatory Visit | Attending: Cardiology | Admitting: Cardiology

## 2017-11-25 DIAGNOSIS — I272 Pulmonary hypertension, unspecified: Secondary | ICD-10-CM

## 2017-11-25 DIAGNOSIS — I10 Essential (primary) hypertension: Secondary | ICD-10-CM

## 2017-11-25 DIAGNOSIS — I119 Hypertensive heart disease without heart failure: Secondary | ICD-10-CM | POA: Insufficient documentation

## 2017-11-25 DIAGNOSIS — I059 Rheumatic mitral valve disease, unspecified: Secondary | ICD-10-CM | POA: Diagnosis not present

## 2017-11-25 DIAGNOSIS — E782 Mixed hyperlipidemia: Secondary | ICD-10-CM | POA: Diagnosis not present

## 2017-11-25 DIAGNOSIS — I081 Rheumatic disorders of both mitral and tricuspid valves: Secondary | ICD-10-CM | POA: Diagnosis not present

## 2017-11-25 NOTE — Progress Notes (Signed)
Echocardiogram 2D Echocardiogram has been performed.  Catherine [name] 11/25/2017, 1:51 PM

## 2017-12-31 ENCOUNTER — Ambulatory Visit (INDEPENDENT_AMBULATORY_CARE_PROVIDER_SITE_OTHER): Payer: Medicare Other | Admitting: *Deleted

## 2017-12-31 DIAGNOSIS — J309 Allergic rhinitis, unspecified: Secondary | ICD-10-CM | POA: Diagnosis not present

## 2018-01-14 ENCOUNTER — Ambulatory Visit: Payer: Medicare Other | Admitting: Obstetrics & Gynecology

## 2018-01-21 ENCOUNTER — Ambulatory Visit (INDEPENDENT_AMBULATORY_CARE_PROVIDER_SITE_OTHER): Payer: Medicare Other | Admitting: *Deleted

## 2018-01-21 DIAGNOSIS — J309 Allergic rhinitis, unspecified: Secondary | ICD-10-CM

## 2018-02-11 ENCOUNTER — Ambulatory Visit (INDEPENDENT_AMBULATORY_CARE_PROVIDER_SITE_OTHER): Payer: Medicare Other | Admitting: *Deleted

## 2018-02-11 DIAGNOSIS — J309 Allergic rhinitis, unspecified: Secondary | ICD-10-CM | POA: Diagnosis not present

## 2018-03-07 ENCOUNTER — Ambulatory Visit (INDEPENDENT_AMBULATORY_CARE_PROVIDER_SITE_OTHER): Payer: Medicare Other | Admitting: *Deleted

## 2018-03-07 DIAGNOSIS — J309 Allergic rhinitis, unspecified: Secondary | ICD-10-CM

## 2018-04-11 ENCOUNTER — Ambulatory Visit (INDEPENDENT_AMBULATORY_CARE_PROVIDER_SITE_OTHER): Payer: Medicare Other | Admitting: *Deleted

## 2018-04-11 DIAGNOSIS — J309 Allergic rhinitis, unspecified: Secondary | ICD-10-CM

## 2018-04-28 ENCOUNTER — Ambulatory Visit (INDEPENDENT_AMBULATORY_CARE_PROVIDER_SITE_OTHER): Payer: Medicare Other | Admitting: *Deleted

## 2018-04-28 DIAGNOSIS — J309 Allergic rhinitis, unspecified: Secondary | ICD-10-CM

## 2018-05-10 ENCOUNTER — Other Ambulatory Visit: Payer: Self-pay | Admitting: Obstetrics & Gynecology

## 2018-05-10 DIAGNOSIS — Z1231 Encounter for screening mammogram for malignant neoplasm of breast: Secondary | ICD-10-CM

## 2018-05-17 DIAGNOSIS — S99922A Unspecified injury of left foot, initial encounter: Secondary | ICD-10-CM

## 2018-05-17 HISTORY — DX: Unspecified injury of left foot, initial encounter: S99.922A

## 2018-05-20 ENCOUNTER — Ambulatory Visit (INDEPENDENT_AMBULATORY_CARE_PROVIDER_SITE_OTHER): Payer: Medicare Other | Admitting: *Deleted

## 2018-05-20 DIAGNOSIS — J309 Allergic rhinitis, unspecified: Secondary | ICD-10-CM | POA: Diagnosis not present

## 2018-05-30 ENCOUNTER — Ambulatory Visit: Payer: Medicare Other | Admitting: Cardiology

## 2018-05-30 ENCOUNTER — Encounter: Payer: Self-pay | Admitting: Cardiology

## 2018-05-30 VITALS — BP 132/82 | HR 76 | Ht 65.0 in | Wt 290.6 lb

## 2018-05-30 DIAGNOSIS — G4733 Obstructive sleep apnea (adult) (pediatric): Secondary | ICD-10-CM

## 2018-05-30 DIAGNOSIS — I272 Pulmonary hypertension, unspecified: Secondary | ICD-10-CM

## 2018-05-30 DIAGNOSIS — I10 Essential (primary) hypertension: Secondary | ICD-10-CM | POA: Diagnosis not present

## 2018-05-30 DIAGNOSIS — I059 Rheumatic mitral valve disease, unspecified: Secondary | ICD-10-CM

## 2018-05-30 NOTE — Progress Notes (Signed)
Cardiology Office Note:    Date:  05/30/2018   ID:  Catherine [name], DOB April 30, 1951, MRN [id number]  PCP:  Lilian Coma., MD  Cardiologist:  Jenne Campus, MD    Referring MD: Lilian Coma., MD   No chief complaint on file. Doing well asymptomatic  History of Present Illness:    Catherine [name] is a 67 y.o. 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  Essential hypertension blood pressure well controlled continue present management.  Past Medical History:  Diagnosis Date  . Anxiety    situational  . Depression    situational  . Fibromyalgia   . GERD (gastroesophageal reflux disease)   . Hypertension   . MVP (mitral valve prolapse)   . Obesity   . Perimenopausal   . Sleep apnea    uses CPAP    Past Surgical History:  Procedure Laterality Date  . KNEE SURGERY Right 06/1994    Current Medications: Current Meds  Medication Sig  . calcium carbonate (OS-CAL - DOSED IN MG OF ELEMENTAL CALCIUM) 1250 MG tablet Take 1 tablet by mouth daily with breakfast.  . candesartan (ATACAND) 8 MG tablet Take 8 mg by mouth.  . cetirizine (ZYRTEC) 10 MG tablet Take 10 mg by mouth daily.  . Cholecalciferol (VITAMIN D) 2000 units CAPS Take 2 capsules by mouth daily.   Catherine [name] esomeprazole (NEXIUM) 40 MG capsule TAKE ONE CAPSULE TWICE DAILY (Patient taking differently: daily. TAKE ONE CAPSULE TWICE DAILY)  . estradiol-norethindrone (COMBIPATCH) 0.05-0.14 MG/DAY PLACE 1 PATCH ONTO SKIN TWICE A WEEK  . ezetimibe (ZETIA) 10 MG tablet Take 1 tablet (10 mg total) by mouth daily. (Patient taking differently: Take 5 mg by mouth daily. )  . Icosapent Ethyl 1 g CAPS Take 1 g by mouth 3 (three) times daily.  Catherine [name] NASACORT ALLERGY 24HR 55 MCG/ACT AERO nasal inhaler Use one spray in each nostril once daily as directed.  . Olopatadine HCl  0.6 % SOLN Can use one to two sprays in each nostril one to two times daily if needed.  . valACYclovir (VALTREX) 1000 MG tablet Take 1 tablet as directed     Allergies:   Zinacef [cefuroxime in sterile water]; Fenofibrate; Adhesive [tape]; Augmentin [amoxicillin-pot clavulanate]; Avelox [moxifloxacin hcl in nacl]; Desipramine hcl; Diclofenac sodium; Iodine; Penicillin g; Rosuvastatin; and Sulfa antibiotics   Social History   Socioeconomic History  . Marital status: Married    Spouse name: Not on file  . Number of children: Not on file  . Years of education: Not on file  . Highest education level: Not on file  Occupational History  . Not on file  Social Needs  . Financial resource strain: Not on file  . Food insecurity:    Worry: Not on file    Inability: Not on file  . Transportation needs:    Medical: Not on file    Non-medical: Not on file  Tobacco Use  . Smoking status: Never Smoker  . Smokeless tobacco: Never Used  Substance and Sexual Activity  . Alcohol use: No  . Drug use: No  . Sexual activity: Yes    Birth control/protection: Post-menopausal  Lifestyle  . Physical activity:    Days per week: Not on file    Minutes per session: Not on file  . Stress: Not on file  Relationships  .  Social connections:    Talks on phone: Not on file    Gets together: Not on file    Attends religious service: Not on file    Active member of club or organization: Not on file    Attends meetings of clubs or organizations: Not on file    Relationship status: Not on file  Other Topics Concern  . Not on file  Social History Narrative  . Not on file     Family History: The patient's family history includes Allergic rhinitis in her mother; Asthma in her maternal grandfather; Cancer in her paternal grandmother; Diabetes in her mother; Hypertension in her father and mother; Infertility in her sister; Kidney failure in her mother. ROS:   Please see the history of present illness.      All 14 point review of systems negative except as described per history of present illness  EKGs/Labs/Other Studies Reviewed:      Recent Labs: 11/12/2017: BUN 17; Creatinine, Ser 0.83; Potassium 4.6; Sodium 143  Recent Lipid Panel    Component Value Date/Time   CHOL 179 11/12/2017 0916   TRIG 182 (H) 11/12/2017 0916   HDL 44 11/12/2017 0916   CHOLHDL 4.1 11/12/2017 0916   CHOLHDL 4.8 06/24/2016 1342   VLDL 35 (H) 06/24/2016 1342   LDLCALC 99 11/12/2017 0916    Physical Exam:    VS:  BP 132/82 (BP Location: Right Arm, Patient Position: Sitting, Cuff Size: Normal)   Pulse 76   Ht 5\' 5"  (1.651 m)   Wt 290 lb 9.6 oz (131.8 kg)   LMP 11/02/2002 (Approximate)   SpO2 97%   BMI 48.36 kg/m     Wt Readings from Last 3 Encounters:  05/30/18 290 lb 9.6 oz (131.8 kg)  11/10/17 292 lb 1.9 oz (132.5 kg)  09/28/17 289 lb (131.1 kg)     GEN:  Well nourished, well developed in no acute distress HEENT: Normal NECK: No JVD; No carotid bruits LYMPHATICS: No lymphadenopathy CARDIAC: RRR, no murmurs, no rubs, no gallops RESPIRATORY:  Clear to auscultation without rales, wheezing or rhonchi  ABDOMEN: Soft, non-tender, non-distended MUSCULOSKELETAL:  No edema; No deformity  SKIN: Warm and dry LOWER EXTREMITIES: no swelling NEUROLOGIC:  Alert and oriented x 3 PSYCHIATRIC:  Normal affect   ASSESSMENT:    1. Essential (primary) hypertension   2. Mild pulmonary hypertension ([place])   3. Mitral valve disease   4. OSA (obstructive sleep apnea)    PLAN:    In order of problems listed above:   Essential hypertension: Blood pressure well controlled continue present medications pulmonary hypertension last echocardiogram showed pulmonary pressure 36 mmHg she does have sleep apnea which is most likely responsible for pulmonary hypertension that being managed appropriately and pulmonary pressure seems to be dropping.  Mitral valve disease only mild I will continue present conservative  approach.   Medication Adjustments/Labs and Tests Ordered: Current medicines are reviewed at length with the patient today.  Concerns regarding medicines are outlined above.  No orders of the defined types were placed in this encounter.  Medication changes: No orders of the defined types were placed in this encounter.   Signed, Park Liter, MD, [facility] 05/30/2018 4:34 PM    [place]

## 2018-05-30 NOTE — Patient Instructions (Signed)
Medication Instructions:  Your physician recommends that you continue on your current medications as directed. Please refer to the Current Medication list given to you today.  Labwork: None  Testing/Procedures: None  Follow-Up: Your physician recommends that you schedule a follow-up appointment in: 6 months  Any Other Special Instructions Will Be Listed Below (If Applicable).     If you need a refill on your cardiac medications before your next appointment, please call your pharmacy.   [facility]  Baelynn Schmuhl A, RN, BSN  

## 2018-05-31 ENCOUNTER — Ambulatory Visit
Admission: RE | Admit: 2018-05-31 | Discharge: 2018-05-31 | Disposition: A | Discharge status: Home / Self Care | Payer: Medicare Other | Source: Ambulatory Visit | Attending: Obstetrics & Gynecology | Admitting: Obstetrics & Gynecology

## 2018-05-31 DIAGNOSIS — Z1231 Encounter for screening mammogram for malignant neoplasm of breast: Secondary | ICD-10-CM

## 2018-06-02 ENCOUNTER — Other Ambulatory Visit: Payer: Self-pay | Admitting: Obstetrics & Gynecology

## 2018-06-02 ENCOUNTER — Ambulatory Visit (INDEPENDENT_AMBULATORY_CARE_PROVIDER_SITE_OTHER): Payer: Medicare Other | Admitting: *Deleted

## 2018-06-02 DIAGNOSIS — J309 Allergic rhinitis, unspecified: Secondary | ICD-10-CM | POA: Diagnosis not present

## 2018-06-02 DIAGNOSIS — R921 Mammographic calcification found on diagnostic imaging of breast: Secondary | ICD-10-CM

## 2018-06-06 ENCOUNTER — Telehealth: Payer: Self-pay | Admitting: Cardiology

## 2018-06-06 MED ORDER — CANDESARTAN CILEXETIL 8 MG PO TABS: 8.0000 mg | ORAL_TABLET | Freq: Every day | ORAL | 2 refills | Status: DC

## 2018-06-06 NOTE — Telephone Encounter (Signed)
Refill sent in to pharmacy 

## 2018-06-06 NOTE — Telephone Encounter (Signed)
[facility] [phone]   [id number] [place], [place] [phone] (Phone) [phone] (Fax)    candesartan (ATACAND) 8 MG tablet  Nydia called to say she needs a refill on her above listed medicine.

## 2018-06-14 ENCOUNTER — Ambulatory Visit
Admission: RE | Admit: 2018-06-14 | Discharge: 2018-06-14 | Disposition: A | Discharge status: Home / Self Care | Payer: Medicare Other | Source: Ambulatory Visit | Attending: Obstetrics & Gynecology | Admitting: Obstetrics & Gynecology

## 2018-06-14 ENCOUNTER — Other Ambulatory Visit: Payer: Self-pay | Admitting: Obstetrics & Gynecology

## 2018-06-14 DIAGNOSIS — R921 Mammographic calcification found on diagnostic imaging of breast: Secondary | ICD-10-CM

## 2018-06-15 ENCOUNTER — Other Ambulatory Visit: Payer: Self-pay | Admitting: *Deleted

## 2018-06-15 MED ORDER — CANDESARTAN CILEXETIL 8 MG PO TABS: 8.0000 mg | ORAL_TABLET | Freq: Every day | ORAL | 2 refills | Status: DC

## 2018-06-15 NOTE — Telephone Encounter (Signed)
Pt phoned to say pharmacy still has not gotten her Atacand so resent.

## 2018-06-16 ENCOUNTER — Ambulatory Visit (INDEPENDENT_AMBULATORY_CARE_PROVIDER_SITE_OTHER): Payer: Medicare Other | Admitting: *Deleted

## 2018-06-16 DIAGNOSIS — J309 Allergic rhinitis, unspecified: Secondary | ICD-10-CM

## 2018-06-19 DIAGNOSIS — R928 Other abnormal and inconclusive findings on diagnostic imaging of breast: Secondary | ICD-10-CM | POA: Insufficient documentation

## 2018-06-20 ENCOUNTER — Ambulatory Visit
Admission: RE | Admit: 2018-06-20 | Discharge: 2018-06-20 | Disposition: A | Discharge status: Home / Self Care | Payer: Medicare Other | Source: Ambulatory Visit | Attending: Obstetrics & Gynecology | Admitting: Obstetrics & Gynecology

## 2018-06-20 DIAGNOSIS — R921 Mammographic calcification found on diagnostic imaging of breast: Secondary | ICD-10-CM

## 2018-06-21 ENCOUNTER — Other Ambulatory Visit: Payer: Self-pay | Admitting: Obstetrics & Gynecology

## 2018-06-21 ENCOUNTER — Telehealth: Payer: Self-pay | Admitting: Obstetrics & Gynecology

## 2018-06-21 DIAGNOSIS — R921 Mammographic calcification found on diagnostic imaging of breast: Secondary | ICD-10-CM

## 2018-06-21 NOTE — Telephone Encounter (Signed)
Patient calling to schedule consultation after abnormal mammogram. Mammogram showed DCIS in duct.

## 2018-06-21 NOTE — Telephone Encounter (Signed)
Call to patient. Left message ( ok per DPR) appointment scheduled for 06-23-18 at 1115. Can call back tomorrow to discuss further.

## 2018-06-21 NOTE — Telephone Encounter (Signed)
Voicemail message received from [place] at Breast center. Patient is scheduled for another biopsy on Thursday 06-23-18 to reassess additional calcifications. They have scheduled appointment with Dr Donne Hazel for 06-29-18 at patients request. She declined multidisciplinary clinic. She later called back and reqested referral to Dr Lonzo Candy in [facility] and she was told that would ned to come from our office.   Angie encouraged patient to keep appointment with Dr Donne Hazel in case changes her mind.

## 2018-06-22 NOTE — Telephone Encounter (Signed)
Pt is having another biopsy on Thursday, as advised by the [place], so it may be better to try and move appt to Friday.  Thanks.

## 2018-06-22 NOTE — Telephone Encounter (Signed)
Call from patient.She is agreeable to Appointment on Thursday at 1115. Offered to reschedule to Friday pending additional results and patient declines.  Advised per Dr Ammie Ferrier instructions to discontinue estrogen patch now.   Encounter closed.

## 2018-06-23 ENCOUNTER — Ambulatory Visit
Admission: RE | Admit: 2018-06-23 | Discharge: 2018-06-23 | Disposition: A | Discharge status: Home / Self Care | Payer: Medicare Other | Source: Ambulatory Visit | Attending: Obstetrics & Gynecology | Admitting: Obstetrics & Gynecology

## 2018-06-23 ENCOUNTER — Encounter: Payer: Self-pay | Admitting: Obstetrics & Gynecology

## 2018-06-23 ENCOUNTER — Ambulatory Visit: Payer: Medicare Other | Admitting: Obstetrics & Gynecology

## 2018-06-23 VITALS — BP 132/72 | HR 84 | Resp 18 | Ht 65.0 in | Wt 287.4 lb

## 2018-06-23 DIAGNOSIS — D0511 Intraductal carcinoma in situ of right breast: Secondary | ICD-10-CM

## 2018-06-23 DIAGNOSIS — R921 Mammographic calcification found on diagnostic imaging of breast: Secondary | ICD-10-CM

## 2018-06-26 ENCOUNTER — Encounter: Payer: Self-pay | Admitting: Obstetrics & Gynecology

## 2018-06-26 NOTE — Progress Notes (Signed)
GYNECOLOGY  VISIT  CC:   Discuss breast biopsy results  HPI: 67 y.o. [id number] Married Caucasian female here for discussion of breast biopsy obtained 06/20/18 showing: Breast, right, needle core biopsy, upper outer - DUCTAL CARCINOMA IN SITU (DCIS) HIGH NUCLEAR GRADE, FOCALLY SUSPICIOUS FOR MICROINVASION, SEE NOTE. - DYSTROPHIC CALCIFICATIONS.  Pt did have another biopsy obtained this morning for a second area behind the above biopsy.  She does not know these results yet.  Pt it aware of the above results.  She has declined going to the multi-disciplinary breast cancer appt at [facility].  Has considered seeing a surgeon in [facility] who (according to the web site) is a Education officer, environmental who does not do any breast surgery.  She reports she called the office and was told otherwise.  She does not currently have an appt.  She does have an appt with Dr. Serita Grammes next week.  Declines seeing an oncologist at this point.  Knows she needs a lumpectomy and possibly more considering the finding of microinvasion.  Also knows if there is invasive breast cancer, she will need to see an oncologist and radiation oncologist as well.  Pt states she needs to take this one step at a time, one appt at a time and in smaller amounts of information.  This is why she's not going to the breast cancer clinic at this time.  Pt knows that I am not a breast surgeon but I answered her questions to the best of my ability regarding her pathology, lumpectomy, possible adjuvant treatment depending on receptor testing and final pathology.  Questions answered to her satisfaction.    States she may want to come back again and talk about her options before she actually decides what she wants to do.  Feels she may need this for processing the information and for decision making.    She was advised to stop the HRT patch which she did two days ago.    GYNECOLOGIC HISTORY: Patient's last menstrual period was 11/02/2002  (approximate).  Patient Active Problem List   Diagnosis Date Noted  . Foot injury, left, initial encounter 05/17/2018  . Treatment-emergent central sleep apnea 06/30/2017  . Pulmonary hypertension, primary ([place]) 05/24/2017  . Mild pulmonary hypertension ([place]) 12/03/2015  . Allergic rhinoconjunctivitis 06/28/2015  . Laryngopharyngeal reflux 06/28/2015  . Essential (primary) hypertension 05/26/2014  . Fatty infiltration of liver 05/26/2014  . HLD (hyperlipidemia) 05/26/2014  . Mitral valve disease 05/26/2014  . Elevation of level of transaminase or lactic acid dehydrogenase (LDH) 05/26/2014  . Nodular goiter, non-toxic 05/26/2014  . Adiposity 05/26/2014  . OSA (obstructive sleep apnea) 05/26/2014  . Plantar fasciitis 05/26/2014  . Avitaminosis D 05/26/2014  . Low back pain 05/26/2014  . Myalgia and myositis 05/26/2014    Past Medical History:  Diagnosis Date  . Anxiety    situational  . Depression    situational  . Fibromyalgia   . GERD (gastroesophageal reflux disease)   . Hypertension   . MVP (mitral valve prolapse)   . Obesity   . Perimenopausal   . Sleep apnea    uses CPAP    Past Surgical History:  Procedure Laterality Date  . KNEE SURGERY Right 06/1994    MEDS:   Current Outpatient Medications on File Prior to Visit  Medication Sig Dispense Refill  . calcium carbonate (OS-CAL - DOSED IN MG OF ELEMENTAL CALCIUM) 1250 MG tablet Take 1 tablet by mouth daily with breakfast.    . candesartan (ATACAND)  8 MG tablet Take 1 tablet (8 mg total) by mouth daily. 90 tablet 2  . cetirizine (ZYRTEC) 10 MG tablet Take 10 mg by mouth daily.    . Cholecalciferol (VITAMIN D) 2000 units CAPS Take 2 capsules by mouth daily.     Marland Kitchen esomeprazole (NEXIUM) 40 MG capsule TAKE ONE CAPSULE TWICE DAILY (Patient taking differently: daily. TAKE ONE CAPSULE TWICE DAILY) 60 capsule 5  . ezetimibe (ZETIA) 10 MG tablet Take 1 tablet (10 mg total) by mouth daily. (Patient taking differently: Take  5 mg by mouth daily. ) 90 tablet 2  . Icosapent Ethyl 1 g CAPS Take 1 g by mouth 3 (three) times daily. 90 capsule 6  . NASACORT ALLERGY 24HR 55 MCG/ACT AERO nasal inhaler Use one spray in each nostril once daily as directed. 1 Inhaler 0  . Olopatadine HCl 0.6 % SOLN Can use one to two sprays in each nostril one to two times daily if needed. 30.5 g 11  . valACYclovir (VALTREX) 1000 MG tablet Take 1 tablet as directed 30 tablet 3   No current facility-administered medications on file prior to visit.     ALLERGIES: Zinacef [cefuroxime in sterile water]; Fenofibrate; Adhesive [tape]; Augmentin [amoxicillin-pot clavulanate]; Avelox [moxifloxacin hcl in nacl]; Desipramine hcl; Diclofenac sodium; Iodine; Penicillin g; Rosuvastatin; and Sulfa antibiotics  Family History  Problem Relation Age of Onset  . Diabetes Mother   . Hypertension Mother   . Allergic rhinitis Mother   . Kidney failure Mother   . Hypertension Father   . Infertility Sister   . Cancer Paternal Grandmother        pancreatic cancer  . Asthma Maternal Grandfather     SH:  Married, non smoker  Review of Systems  All other systems reviewed and are negative.   PHYSICAL EXAMINATION:    BP 132/72 (BP Location: Left Arm, Patient Position: Sitting, Cuff Size: Large)   Pulse 84   Resp 18   Ht 5\' 5"  (1.651 m)   Wt 287 lb 6.4 oz (130.4 kg)   LMP 11/02/2002 (Approximate)   BMI 47.83 kg/m     General appearance: alert, cooperative and appears stated age No exam performed today  Assessment: DSIC with possible microinvasion, additional biopsy obtained today  Plan: Pt will see Dr. Donne Hazel on Wednesday and will then proceed with scheduling lumpectomy, possible SNB.  Additional treatment will need to be planned after final pathology.   ~20 minutes spent with patient >50% of time was in face to face discussion of above.

## 2018-06-30 ENCOUNTER — Telehealth: Payer: Self-pay | Admitting: Obstetrics & Gynecology

## 2018-06-30 ENCOUNTER — Ambulatory Visit (INDEPENDENT_AMBULATORY_CARE_PROVIDER_SITE_OTHER): Payer: Medicare Other | Admitting: *Deleted

## 2018-06-30 ENCOUNTER — Other Ambulatory Visit: Payer: Self-pay | Admitting: General Surgery

## 2018-06-30 DIAGNOSIS — J309 Allergic rhinitis, unspecified: Secondary | ICD-10-CM | POA: Diagnosis not present

## 2018-06-30 DIAGNOSIS — Z170 Estrogen receptor positive status [ER+]: Principal | ICD-10-CM

## 2018-06-30 DIAGNOSIS — C50411 Malignant neoplasm of upper-outer quadrant of right female breast: Secondary | ICD-10-CM

## 2018-06-30 NOTE — Telephone Encounter (Signed)
Patient calling to let Dr. Sabra Heck know that she has decided to use Dr. Donne Hazel for surgery. He's ordered an MRI and will be ordering genetic testing as discussed. Once surgery is completed, she will probably make another appointment with [place] to discuss further treatment.

## 2018-06-30 NOTE — Telephone Encounter (Signed)
Spoke with patient. Advised I will forward message to Dr. Lestine Box.  Thankful for return call.   Routing to provider for final review. Patient is agreeable to disposition. Will close encounter.

## 2018-07-01 ENCOUNTER — Other Ambulatory Visit: Payer: Self-pay | Admitting: General Surgery

## 2018-07-01 DIAGNOSIS — Z170 Estrogen receptor positive status [ER+]: Principal | ICD-10-CM

## 2018-07-01 DIAGNOSIS — C50411 Malignant neoplasm of upper-outer quadrant of right female breast: Secondary | ICD-10-CM

## 2018-07-07 ENCOUNTER — Encounter: Payer: Self-pay | Admitting: Genetic Counselor

## 2018-07-07 ENCOUNTER — Telehealth: Payer: Self-pay | Admitting: Genetic Counselor

## 2018-07-07 NOTE — Telephone Encounter (Signed)
Received an urgent genetic counseling appt from Dr. Donne Hazel for breast cancer. Pt has been scheduled for genetics on 9/11 at 10am. Pt aware to arrive 15 minutes early. Letter mailed.

## 2018-07-10 ENCOUNTER — Ambulatory Visit
Admission: RE | Admit: 2018-07-10 | Discharge: 2018-07-10 | Disposition: A | Discharge status: Home / Self Care | Payer: Medicare Other | Source: Ambulatory Visit | Attending: General Surgery | Admitting: General Surgery

## 2018-07-10 DIAGNOSIS — Z170 Estrogen receptor positive status [ER+]: Principal | ICD-10-CM

## 2018-07-10 DIAGNOSIS — C50411 Malignant neoplasm of upper-outer quadrant of right female breast: Secondary | ICD-10-CM

## 2018-07-10 MED ORDER — GADOBENATE DIMEGLUMINE 529 MG/ML IV SOLN
20.0000 mL | Freq: Once | INTRAVENOUS | Status: AC | PRN
  Administered 2018-07-10: 20 mL via INTRAVENOUS

## 2018-07-12 ENCOUNTER — Telehealth: Payer: Self-pay | Admitting: Obstetrics & Gynecology

## 2018-07-12 NOTE — Telephone Encounter (Signed)
Patient calling to let Dr. Sabra Heck know she wants to follow up with oncologist.

## 2018-07-12 NOTE — Telephone Encounter (Signed)
I do not think seeing an oncologist prior to surgery will change any of the surgical recommendations.  However, if she would like to see and oncologist, I'm happy to help set this up for her.  I'm glad see is doing genetic testing as this could would change the surgical recommendations if it were positive.  Oncologist recommendations I'd personally want to see Dr. Lindi Adie or Dr. Jana Hakim.

## 2018-07-12 NOTE — Telephone Encounter (Signed)
Spoke with patient.   1. Patient is waiting for final results from Dr. Donne Hazel from MRI. No surgery scheduled to date.   2. Scheduled to see Roma Kayser for genetics counseling on 9/11.   3. Patient requesting recommendations for oncologist? Consult with oncologist prior to surgery? Or wait until after?   Advised will review with Dr. Sabra Heck and return call.   Dr. Sabra Heck -please advise.

## 2018-07-13 ENCOUNTER — Other Ambulatory Visit: Payer: Self-pay | Admitting: General Surgery

## 2018-07-13 ENCOUNTER — Encounter: Payer: Self-pay | Admitting: Genetics

## 2018-07-13 ENCOUNTER — Inpatient Hospital Stay: Payer: Medicare Other | Attending: Genetic Counselor | Admitting: Genetics

## 2018-07-13 ENCOUNTER — Inpatient Hospital Stay: Payer: Medicare Other

## 2018-07-13 DIAGNOSIS — Z800 Family history of malignant neoplasm of digestive organs: Secondary | ICD-10-CM

## 2018-07-13 DIAGNOSIS — Z803 Family history of malignant neoplasm of breast: Secondary | ICD-10-CM | POA: Diagnosis not present

## 2018-07-13 DIAGNOSIS — C50911 Malignant neoplasm of unspecified site of right female breast: Secondary | ICD-10-CM

## 2018-07-13 DIAGNOSIS — Z170 Estrogen receptor positive status [ER+]: Secondary | ICD-10-CM

## 2018-07-13 DIAGNOSIS — C50411 Malignant neoplasm of upper-outer quadrant of right female breast: Secondary | ICD-10-CM

## 2018-07-13 DIAGNOSIS — C50011 Malignant neoplasm of nipple and areola, right female breast: Secondary | ICD-10-CM

## 2018-07-13 DIAGNOSIS — C50919 Malignant neoplasm of unspecified site of unspecified female breast: Secondary | ICD-10-CM | POA: Insufficient documentation

## 2018-07-13 NOTE — Telephone Encounter (Signed)
Spoke with patient, advised as seen below per Dr. Sabra Heck. Patient thankful for return call, no referral needed at this time, will wait until after surgery.   Routing to provider for final review. Patient is agreeable to disposition. Will close encounter.

## 2018-07-13 NOTE — Telephone Encounter (Signed)
Left message to call Mirko Tailor at [phone].  

## 2018-07-13 NOTE — Telephone Encounter (Signed)
Patient returned call to Jill. °

## 2018-07-13 NOTE — Progress Notes (Signed)
REFERRING PROVIDER: Rolm Bookbinder, MD [address] [place], [place] [address]  PRIMARY PROVIDER:  Patient, No Pcp Per  PRIMARY REASON FOR VISIT:  1. Malignant neoplasm of right breast in female, estrogen receptor positive, unspecified site of breast ([place])   2. Family history of breast cancer   3. Family history of pancreatic cancer     HISTORY OF PRESENT ILLNESS:   Ms. Desanctis, a 67 y.o. female, was seen for a [place] cancer genetics consultation at the request of Dr. Donne Hazel due to a personal and family history of cancer.  Ms. Mackowski presents to clinic today to discuss the possibility of a hereditary predisposition to cancer, genetic testing, and to further clarify her future cancer risks, as well as potential cancer risks for family members.   In 2019, at the age of 62, Ms. Baylock was diagnosed with INVASIVE MAMMARY CARCINOMA and  MAMMARY CARCINOMA IN-SITU of the right breast ER/PR+, HER2 neg. She is currently planning on having a lumpectomy  HORMONAL RISK FACTORS:  Menarche was at age 41.  First live birth at age N/A.  OCP use : yes Ovaries intact: yes.  Hysterectomy: no.  Menopausal status: postmenopausal.  HRT use: 5-6 years. Colonoscopy: yes; many years ago- due for next c-scope this year, is putting off until after breast cancer treatment. Mammogram within the last year: yes.  Past Medical History:  Diagnosis Date  . Anxiety    situational  . Breast cancer ([place])   . Depression    situational  . Family history of breast cancer   . Family history of pancreatic cancer   . Fibromyalgia   . GERD (gastroesophageal reflux disease)   . Hypertension   . MVP (mitral valve prolapse)   . Obesity   . Perimenopausal   . Sleep apnea    uses CPAP    Past Surgical History:  Procedure Laterality Date  . KNEE SURGERY Right 06/1994    Social History   Socioeconomic History  . Marital status: Married    Spouse name: Not on file  . Number of children: Not on  file  . Years of education: Not on file  . Highest education level: Not on file  Occupational History  . Not on file  Social Needs  . Financial resource strain: Not on file  . Food insecurity:    Worry: Not on file    Inability: Not on file  . Transportation needs:    Medical: Not on file    Non-medical: Not on file  Tobacco Use  . Smoking status: Never Smoker  . Smokeless tobacco: Never Used  Substance and Sexual Activity  . Alcohol use: No  . Drug use: No  . Sexual activity: Yes    Birth control/protection: Post-menopausal  Lifestyle  . Physical activity:    Days per week: Not on file    Minutes per session: Not on file  . Stress: Not on file  Relationships  . Social connections:    Talks on phone: Not on file    Gets together: Not on file    Attends religious service: Not on file    Active member of club or organization: Not on file    Attends meetings of clubs or organizations: Not on file    Relationship status: Not on file  Other Topics Concern  . Not on file  Social History Narrative  . Not on file     FAMILY HISTORY:  We obtained a detailed, 4-generation  family history.  Significant diagnoses are listed below: Family History  Problem Relation Age of Onset  . Diabetes Mother   . Hypertension Mother   . Allergic rhinitis Mother   . Kidney failure Mother   . Hypertension Father   . Heart attack Father   . Infertility Sister   . Cancer Paternal Grandmother   . Asthma Maternal Grandfather   . Pancreatic cancer Maternal Grandfather 72  . Heart disease Paternal Aunt   . Other Cousin        liver cancer/failure?  . Breast cancer Other 74   Ms. Natt has no children.  She has a sister who is 65 with no history of cancer.  She has 2 fraternal twin girls (patient's nieces).    Ms. Matsushita father: died at 69 due to a heart attack. Paternal aunts/Uncles: 1 paternal aunt is 74 with heart disease, 5 paternal uncles with no history of cancer.  Paternal cousins: 1  paternal cousin died of liver disease- might have been liver cancer.  No other cancer history known, but limited information about these relatives.  Paternal grandfather: no history of cancer.  Paternal grandmother:had a colon issue, unk if cancer or not, died at 92. He had a sister (patient's great aunt) with breast cancer dx in her 37's.   Ms. Renier mother: died at 9 due to kidney failure and stroke. She had her uterus and ovaries intact.  Maternal Aunts/Uncles: none, mother was only child Maternal cousins: none Maternal grandfather: died at 67, had pancreatic cancer dx in 38's Maternal grandmother:died at 44 with no history of cancer.   Ms. Lingenfelter is unaware of previous family history of genetic testing for hereditary cancer risks. Patient's maternal ancestors are of [place] descent, and paternal ancestors are of [place] descent. There is no reported Ashkenazi Jewish ancestry. There is no known consanguinity.  GENETIC COUNSELING ASSESSMENT: JAZARIA JARECKI is a 68 y.o. female with a personal and family history which is somewhat suggestive of a Hereditary Cancer Predisposition Syndrome. We, therefore, discussed and recommended the following at today's visit.   DISCUSSION: We reviewed the characteristics, features and inheritance patterns of hereditary cancer syndromes. We also discussed genetic testing, including the appropriate family members to test, the process of testing, insurance coverage and turn-around-time for results. We discussed the implications of a negative, positive and/or variant of uncertain significant result. We recommended Ms. Limas pursue genetic testing for the Common Hereditary Cancers gene panel.   The Common Hereditary Cancer Panel offered by [facility] includes sequencing and/or deletion duplication testing of the following 55 genes: APC, ATM, AXIN2, BARD1, BLM, BMPR1A, BRCA1, BRCA2, BRIP1, BUB1B, CDH1, CDK4, CDKN2A, CEP57, CHEK2, CTNNA1, DICER1, ENG, EPCAM, GALNT12, GREM1,  HOXB13, KIT, MEN1, MLH1, MLH3, MSH2, MSH3, MSH6, MUTYH, NBN, NF1, NTHL1, PALB2, PDGFRA, PMS2, POLD1, POLE, PTEN, RAD50, RAD51C, RAD51D, RNF43, RPS20, SDHA, SDHB, SDHC, SDHD, SMAD4, SMARCA4, STK11, TP53, TSC1, TSC2, VHL  Ms. Nobles expressed that the results of this testing would not impact her upcoming breast surgical decisions.    We discussed that only 5-10% of cancers are associated with a Hereditary cancer predisposition syndrome.  One of the most common hereditary cancer syndromes that increases breast cancer risk is called Hereditary Breast and Ovarian Cancer (HBOC) syndrome.  This syndrome is caused by mutations in the BRCA1 and BRCA2 genes.  This syndrome increases an individual's lifetime risk to develop breast, ovarian, pancreatic, and other types of cancer.  There are also many other cancer predisposition syndromes caused by mutations  in several other genes.  We discussed that if she is found to have a mutation in one of these genes, it may impact surgical decisions, and alter future medical management recommendations such as increased cancer screenings and consideration of risk reducing surgeries.  A positive result could also have implications for the patient's family members.  A Negative result would mean we were unable to identify a hereditary component to her cancer, but does not rule out the possibility of a hereditary basis for her cancer.  There could be mutations that are undetectable by current technology, or in genes not yet tested or identified to increase cancer risk.    We discussed the potential to find a Variant of Uncertain Significance or VUS.  These are variants that have not yet been identified as pathogenic or benign, and it is unknown if this variant is associated with increased cancer risk or if this is a normal finding.  Most VUS's are reclassified to benign or likely benign.   It should not be used to make medical management decisions. With time, we suspect the lab will  determine the significance of any VUS's identified if any.   Based on Ms. Cureton's personal and family history of cancer, she meets medical criteria for genetic testing. Despite that she meets NCCN criteria, she may still have an out of pocket cost. The laboratory can provide her with an estimate of her OOP cost. she was given the contact information of the laboratory if she has further questions.   PLAN: After considering the risks, benefits, and limitations, Ms. Langham  provided informed consent to pursue genetic testing and the blood sample was sent to [facility] for analysis of the Common Hereditary Cancers Panel. Results should be available within approximately 2-3 weeks' time, at which point they will be disclosed by telephone to Ms. Ziesmer, as will any additional recommendations warranted by these results. Ms. Tang will receive a summary of her genetic counseling visit and a copy of her results once available. This information will also be available in Epic. We encouraged Ms. Ammons to remain in contact with cancer genetics annually so that we can continuously update the family history and inform her of any changes in cancer genetics and testing that may be of benefit for her family. Ms. Fennel questions were answered to her satisfaction today. Our contact information was provided should additional questions or concerns arise.  Based on Ms. Massoud's family history, maternal relatives could consider genetic counseling and testing. Ms. Whitsitt will let us know if we can be of any assistance in coordinating genetic counseling and/or testing for this family member.   Lastly, we encouraged Ms. Bayron to remain in contact with cancer genetics annually so that we can continuously update the family history and inform her of any changes in cancer genetics and testing that may be of benefit for this family.   Ms.  Sayres questions were answered to her satisfaction today. Our contact information was provided should  additional questions or concerns arise. Thank you for the referral and allowing [place] to share in the care of your patient.   Tana Felts, MS, [facility] Certified Genetic Counselor [email or web address] phone: [phone]  The patient was seen for a total of 30 minutes in face-to-face genetic counseling. This patient was discussed with Drs. Magrinat, Lindi Adie and/or Burr Medico who agrees with the above.

## 2018-07-18 ENCOUNTER — Other Ambulatory Visit: Payer: Self-pay | Admitting: General Surgery

## 2018-07-18 DIAGNOSIS — C50411 Malignant neoplasm of upper-outer quadrant of right female breast: Secondary | ICD-10-CM

## 2018-07-18 DIAGNOSIS — Z170 Estrogen receptor positive status [ER+]: Principal | ICD-10-CM

## 2018-07-19 NOTE — Progress Notes (Signed)
VIALS EXP 07-20-19 

## 2018-07-20 DIAGNOSIS — J3089 Other allergic rhinitis: Secondary | ICD-10-CM

## 2018-07-21 DIAGNOSIS — J301 Allergic rhinitis due to pollen: Secondary | ICD-10-CM | POA: Diagnosis not present

## 2018-07-22 ENCOUNTER — Encounter: Payer: Self-pay | Admitting: Oncology

## 2018-07-22 ENCOUNTER — Telehealth: Payer: Self-pay | Admitting: Oncology

## 2018-07-22 NOTE — Telephone Encounter (Signed)
New referral received from Dr. Donne Hazel at [place]. Pt has been scheduled to see Dr. Jana Hakim on 10/28 at 4pm. Pt agreed to the appt date and time. Letter mailed.

## 2018-07-28 ENCOUNTER — Ambulatory Visit (INDEPENDENT_AMBULATORY_CARE_PROVIDER_SITE_OTHER): Payer: Medicare Other | Admitting: *Deleted

## 2018-07-28 DIAGNOSIS — J309 Allergic rhinitis, unspecified: Secondary | ICD-10-CM | POA: Diagnosis not present

## 2018-07-29 ENCOUNTER — Telehealth: Payer: Self-pay | Admitting: *Deleted

## 2018-07-29 NOTE — Telephone Encounter (Signed)
Spoke to pt regarding radiation oncology. Pt informed she wishes to go to [facility] for radiation.  Referral faxed.

## 2018-08-01 ENCOUNTER — Encounter: Payer: Self-pay | Admitting: Genetics

## 2018-08-01 ENCOUNTER — Ambulatory Visit: Payer: Self-pay | Admitting: Genetics

## 2018-08-01 ENCOUNTER — Telehealth: Payer: Self-pay | Admitting: Genetics

## 2018-08-01 DIAGNOSIS — Z1379 Encounter for other screening for genetic and chromosomal anomalies: Secondary | ICD-10-CM

## 2018-08-01 DIAGNOSIS — Z803 Family history of malignant neoplasm of breast: Secondary | ICD-10-CM

## 2018-08-01 DIAGNOSIS — Z170 Estrogen receptor positive status [ER+]: Secondary | ICD-10-CM

## 2018-08-01 DIAGNOSIS — C50911 Malignant neoplasm of unspecified site of right female breast: Secondary | ICD-10-CM

## 2018-08-01 DIAGNOSIS — Z800 Family history of malignant neoplasm of digestive organs: Secondary | ICD-10-CM

## 2018-08-01 HISTORY — DX: Encounter for other screening for genetic and chromosomal anomalies: Z13.79

## 2018-08-01 NOTE — Telephone Encounter (Signed)
Revealed negative genetic testing.  Revealed that a VUS in HOXB13 was identified.   This normal result is reassuring and indicates that it is unlikely Catherine [name]'s cancer is due to a hereditary cause.  It is unlikely that there is an increased risk of another cancer due to a mutation in one of these genes.  However, genetic testing is not perfect, and cannot definitively rule out a hereditary cause.  It will be important for her to keep in contact with genetics to learn if any additional testing may be needed in the future.     Recommended she continue to follow her health care providers recommendations regarding cancer treatment/screening.

## 2018-08-01 NOTE — Progress Notes (Signed)
HPI:  Ms. Notaro was previously seen in the [facility] on 07/13/2018 due to a personal and family history of cancer and concerns regarding a hereditary predisposition to cancer. Please refer to our prior cancer genetics clinic note for more information regarding Ms. Billard's medical, social and family histories, and our assessment and recommendations, at the time. Ms. Boerner recent genetic test results were disclosed to her, as well as recommendations warranted by these results. These results and recommendations are discussed in more detail below.  CANCER HISTORY:  In 2019, at the age of 49, Ms. Bartosik was diagnosed with INVASIVE MAMMARY CARCINOMA and  MAMMARY CARCINOMA IN-SITU of the right breast ER/PR+, HER2 neg. She is currently planning on having a lumpectomy   FAMILY HISTORY:  We obtained a detailed, 4-generation family history.  Significant diagnoses are listed below: Family History  Problem Relation Age of Onset  . Diabetes Mother   . Hypertension Mother   . Allergic rhinitis Mother   . Kidney failure Mother   . Hypertension Father   . Heart attack Father   . Infertility Sister   . Cancer Paternal Grandmother   . Asthma Maternal Grandfather   . Pancreatic cancer Maternal Grandfather 72  . Heart disease Paternal Aunt   . Other Cousin        liver cancer/failure?  . Breast cancer Other 89    Ms. Normoyle has no children.  She has a sister who is 66 with no history of cancer.  She has 2 fraternal twin girls (patient's nieces).    Ms. Janosik father: died at 67 due to a heart attack. Paternal aunts/Uncles: 1 paternal aunt is 59 with heart disease, 5 paternal uncles with no history of cancer.  Paternal cousins: 1 paternal cousin died of liver disease- might have been liver cancer.  No other cancer history known, but limited information about these relatives.  Paternal grandfather: no history of cancer.  Paternal grandmother:had a colon issue, unk if cancer or not, died  at 30. He had a sister (patient's great aunt) with breast cancer dx in her 9's.   Ms. Phung mother: died at 60 due to kidney failure and stroke. She had her uterus and ovaries intact.  Maternal Aunts/Uncles: none, mother was only child Maternal cousins: none Maternal grandfather: died at 62, had pancreatic cancer dx in 22's Maternal grandmother:died at 4 with no history of cancer.   Ms. Renner is unaware of previous family history of genetic testing for hereditary cancer risks. Patient's maternal ancestors are of [place] descent, and paternal ancestors are of [place] descent. There is no reported Ashkenazi Jewish ancestry. There is no known consanguinity.  GENETIC TEST RESULTS: Genetic testing performed through [facility]'s Common Hereditary Cancers Panel reported out on 07/22/2018 showed no pathogenic mutations. The Common Hereditary Cancer Panel offered by [facility] includes sequencing and/or deletion duplication testing of the following 55 genes: APC, ATM, AXIN2, BARD1, BLM, BMPR1A, BRCA1, BRCA2, BRIP1, BUB1B, CDH1, CDK4, CDKN2A, CEP57, CHEK2, CTNNA1, DICER1, ENG, EPCAM, GALNT12, GREM1, HOXB13, KIT, MEN1, MLH1, MLH3, MSH2, MSH3, MSH6, MUTYH, NBN, NF1, NTHL1, PALB2, PDGFRA, PMS2, POLD1, POLE, PTEN, RAD50, RAD51C, RAD51D, RNF43, RPS20, SDHA, SDHB, SDHC, SDHD, SMAD4, SMARCA4, STK11, TP53, TSC1, TSC2, VHL  A variant of uncertain significance (VUS) in a gene called HOXB13 was also noted. c.130C>T (p.Pro44Ser)  The test report will be scanned into EPIC and will be located under the Molecular Pathology section of the Results Review tab. A portion of the result report is included  below for reference.     We discussed with Ms. Seyer that because current genetic testing is not perfect, it is possible there may be a gene mutation in one of these genes that current testing cannot detect, but that chance is small.  We also discussed, that there could be another gene that has not yet been discovered, or that we  have not yet tested, that is responsible for the cancer diagnoses in the family. It is also possible there is a hereditary cause for the cancer in the family that Ms. Hernandes did not inherit and therefore was not identified in her testing.  Therefore, it is important to remain in touch with cancer genetics in the future so that we can continue to offer Ms. Nawrot the most up to date genetic testing.   Regarding the VUS in HOXB13: At this time, it is unknown if this variant is associated with increased cancer risk or if this is a normal finding, but most variants such as this get reclassified to being inconsequential. It should not be used to make medical management decisions. With time, we suspect the lab will determine the significance of this variant, if any. If we do learn more about it, we will try to contact Ms. Chiu to discuss it further. However, it is important to stay in touch with [place] periodically and keep the address and phone number up to date.  ADDITIONAL GENETIC TESTING: We discussed with Ms. Auker that there are other genes that are associated with increased cancer risk that can be analyzed. The laboratories that offer this testing look at these additional genes via a hereditary cancer gene panel. Should Ms. Leventhal wish to pursue additional genetic testing, we are happy to discuss and coordinate this testing, at any time.    CANCER SCREENING RECOMMENDATIONS: Ms. Wolters test result is considered negative (normal).  This means that we have not identified a hereditary cause for her personal and family history of cancer at this time. his result indicates that it is unlikely Ms. Munter has an increased risk for a future cancer due to a mutation in one of these genes. This normal test also suggests that Ms. Onley's cancer was most likely not due to an inherited predisposition associated with one of these genes  While reassuring, this does not definitively rule out a hereditary predisposition to cancer. It is  still possible that there could be genetic mutations that are undetectable by current technology, or genetic mutations in genes that have not been tested or identified to increase cancer risk.  Therefore, it is recommended she continue to follow the cancer management and screening guidelines provided by her oncology and primary healthcare provider. An individual's cancer risk is not determined by genetic test results alone.  Overall cancer risk assessment includes additional factors such as personal medical history, family history, etc.  These should be used to make a personalized plan for cancer prevention and surveillance.    RECOMMENDATIONS FOR FAMILY MEMBERS:  Relatives in this family might be at some increased risk of developing cancer, over the general population risk, simply due to the family history of cancer.  We recommended women in this family have a yearly mammogram beginning at age 70, or 91 years younger than the earliest onset of cancer, an annual clinical breast exam, and perform monthly breast self-exams. Women in this family should also have a gynecological exam as recommended by their primary provider. All family members should have a colonoscopy by age 38 (or  as directed by their doctors).  All family members should inform their physicians about the family history of cancer so their doctors can make the most appropriate screening recommendations for them.   It is also possible there is a hereditary cause for the cancer in Ms. Clardy's family that she did not inherit and therefore was not identified in her.   Therefore, paternal relatives could also have genetic counseling and testing (due to the pancreatic cancer). Ms. Nakatani will let us know if we can be of any assistance in coordinating genetic counseling and/or testing for these family members.   FOLLOW-UP: Lastly, we discussed with Ms. Morocho that cancer genetics is a rapidly advancing field and it is possible that new genetic tests will be  appropriate for her and/or her family members in the future. We encouraged her to remain in contact with cancer genetics on an annual basis so we can update her personal and family histories and let her know of advances in cancer genetics that may benefit this family.   Our contact number was provided. Ms. Dillow questions were answered to her satisfaction, and she knows she is welcome to call us at anytime with additional questions or concerns.   Ferol Luz, MS, [facility] Certified Genetic Counselor [email or web address]

## 2018-08-08 ENCOUNTER — Other Ambulatory Visit: Payer: Self-pay

## 2018-08-08 ENCOUNTER — Encounter (HOSPITAL_COMMUNITY)
Admission: RE | Admit: 2018-08-08 | Discharge: 2018-08-08 | Disposition: A | Discharge status: Home / Self Care | Payer: Medicare Other | Source: Ambulatory Visit | Attending: General Surgery | Admitting: General Surgery

## 2018-08-08 ENCOUNTER — Encounter (HOSPITAL_COMMUNITY): Payer: Self-pay

## 2018-08-08 DIAGNOSIS — K219 Gastro-esophageal reflux disease without esophagitis: Secondary | ICD-10-CM | POA: Diagnosis not present

## 2018-08-08 DIAGNOSIS — G4730 Sleep apnea, unspecified: Secondary | ICD-10-CM | POA: Diagnosis not present

## 2018-08-08 DIAGNOSIS — Z01818 Encounter for other preprocedural examination: Secondary | ICD-10-CM | POA: Insufficient documentation

## 2018-08-08 DIAGNOSIS — C50411 Malignant neoplasm of upper-outer quadrant of right female breast: Secondary | ICD-10-CM | POA: Insufficient documentation

## 2018-08-08 DIAGNOSIS — I10 Essential (primary) hypertension: Secondary | ICD-10-CM | POA: Diagnosis not present

## 2018-08-08 DIAGNOSIS — Z170 Estrogen receptor positive status [ER+]: Secondary | ICD-10-CM | POA: Diagnosis not present

## 2018-08-08 DIAGNOSIS — Z6841 Body Mass Index (BMI) 40.0 and over, adult: Secondary | ICD-10-CM | POA: Diagnosis not present

## 2018-08-08 DIAGNOSIS — Z79899 Other long term (current) drug therapy: Secondary | ICD-10-CM | POA: Diagnosis not present

## 2018-08-08 HISTORY — DX: Cardiac murmur, unspecified: R01.1

## 2018-08-08 HISTORY — DX: Zoster without complications: B02.9

## 2018-08-08 HISTORY — DX: Unspecified osteoarthritis, unspecified site: M19.90

## 2018-08-08 HISTORY — DX: Allergy, unspecified, initial encounter: T78.40XA

## 2018-08-08 LAB — CBC
HCT: 39.7 % (ref 36.0–46.0)
Hemoglobin: 12.4 g/dL (ref 12.0–15.0)
MCH: 29 pg (ref 26.0–34.0)
MCHC: 31.2 g/dL (ref 30.0–36.0)
MCV: 93 fL (ref 78.0–100.0)
PLATELETS: 238 10*3/uL (ref 150–400)
RBC: 4.27 MIL/uL (ref 3.87–5.11)
RDW: 13.1 % (ref 11.5–15.5)
WBC: 8.3 10*3/uL (ref 4.0–10.5)

## 2018-08-08 LAB — BASIC METABOLIC PANEL
ANION GAP: 7 (ref 5–15)
BUN: 14 mg/dL (ref 8–23)
CALCIUM: 9.2 mg/dL (ref 8.9–10.3)
CO2: 26 mmol/L (ref 22–32)
Chloride: 106 mmol/L (ref 98–111)
Creatinine, Ser: 0.75 mg/dL (ref 0.44–1.00)
GLUCOSE: 143 mg/dL — AB (ref 70–99)
POTASSIUM: 3.4 mmol/L — AB (ref 3.5–5.1)
SODIUM: 139 mmol/L (ref 135–145)

## 2018-08-08 NOTE — Progress Notes (Signed)
She currently has no PCP Cardio is Dr. Nelta Numbers   LOV 11/2017 Echo done 11/2017 Endocrinologist is Dr. Margarita Sermons  LOV 01/2018 Pulmonologist is Dr. Governor Rooks  LOV  09/2017  (He manages the CPAP) Patient does not know setting on CPAP (but will bring nasal prong - no mask)

## 2018-08-08 NOTE — Pre-Procedure Instructions (Signed)
[name]  08/08/2018      [id number] [name], [name] [address] [place] [place] [address] Phone: [phone] Fax: [phone]    Your procedure is scheduled on Thursday, Oct. 10th   Report to [facility] Admitting at 5:30 AM             (posted surgery time 7:30a - 9a)   Call this number if you have problems the morning of surgery:  [phone]   Remember:   Do not eat any foods after midnight, Wednesday.   You may drink clear liquids until 0430.  Clear liquids allowed are:    Water, Juice (non-citric and without pulp), Clear Tea, Black Coffee only, Plain Jell-O only and Gatorade                You will need to drink the pre surgery Ensure prior to 0430 AM    Take these medicines the morning of surgery with A SIP OF WATER : Nexium    Do not wear jewelry, make-up or nail polish.  Do not wear lotions, powders,  perfumes, or deodorant.  Do not shave 48 hours prior to surgery.  Do not bring valuables to the hospital.  [facility] is not responsible for any belongings or valuables.  Contacts, dentures or bridgework may not be worn into surgery.  Leave your suitcase in the car.  After surgery it may be brought to your room.  For patients admitted to the hospital, discharge time will be determined by your treatment team.  Patients discharged from the hospital on the same day, will need to have someone stay with you for the first 24 hrs.  Please read over the following fact sheets that you were given.       [name]- Preparing For Surgery  Before surgery, you can play an important role. Because skin is not sterile, your skin needs to be as free of germs as possible. You can reduce the number of germs on your skin by washing with CHG (chlorahexidine gluconate) Soap before surgery.  CHG is an antiseptic cleaner which kills germs and bonds with the skin to continue killing germs even after washing.    Oral Hygiene is  also important to reduce your risk of infection.    Remember - BRUSH YOUR TEETH THE MORNING OF SURGERY WITH YOUR REGULAR TOOTHPASTE  Please do not use if you have an allergy to CHG or antibacterial soaps. If your skin becomes reddened/irritated stop using the CHG.  Do not shave (including legs and underarms) for at least 48 hours prior to first CHG shower. It is OK to shave your face.  Please follow these instructions carefully.   1. Shower the NIGHT BEFORE SURGERY and the MORNING OF SURGERY with CHG.   2. If you chose to wash your hair, wash your hair first as usual with your normal shampoo.  3. After you shampoo, rinse your hair and body thoroughly to remove the shampoo.  4. Use CHG as you would any other liquid soap. You can apply CHG directly to the skin and wash gently with a scrungie or a clean washcloth.   5. Apply the CHG Soap to your body ONLY FROM THE NECK DOWN.  Do not use on open wounds or open sores. Avoid contact with your eyes, ears, mouth and genitals (private parts). Wash Face and genitals (private parts)  with your normal soap.  6. Wash thoroughly, paying special  attention to the area where your surgery will be performed.  7. Thoroughly rinse your body with warm water from the neck down.  8. DO NOT shower/wash with your normal soap after using and rinsing off the CHG Soap.  9. Pat yourself dry with a CLEAN TOWEL.  10. Wear CLEAN PAJAMAS to bed the night before surgery, wear comfortable clothes the morning of surgery  11. Place CLEAN SHEETS on your bed the night of your first shower and DO NOT SLEEP WITH PETS.  Day of Surgery:  Do not apply any deodorants/lotions.  Please wear clean clothes to the hospital/surgery center.   Remember to brush your teeth WITH YOUR REGULAR TOOTHPASTE.

## 2018-08-10 ENCOUNTER — Ambulatory Visit
Admission: RE | Admit: 2018-08-10 | Discharge: 2018-08-10 | Disposition: A | Discharge status: Home / Self Care | Payer: Medicare Other | Source: Ambulatory Visit | Attending: General Surgery | Admitting: General Surgery

## 2018-08-10 ENCOUNTER — Encounter (HOSPITAL_COMMUNITY): Payer: Self-pay | Admitting: Anesthesiology

## 2018-08-10 DIAGNOSIS — Z170 Estrogen receptor positive status [ER+]: Principal | ICD-10-CM

## 2018-08-10 DIAGNOSIS — C50411 Malignant neoplasm of upper-outer quadrant of right female breast: Secondary | ICD-10-CM

## 2018-08-10 MED ORDER — CLINDAMYCIN PHOSPHATE 900 MG/50ML IV SOLN
900.0000 mg | INTRAVENOUS | Status: AC
  Administered 2018-08-11: 900 mg via INTRAVENOUS
  Filled 2018-08-10: qty 50

## 2018-08-10 NOTE — Anesthesia Preprocedure Evaluation (Addendum)
Anesthesia Evaluation  Patient identified by MRN, date of birth, ID band Patient awake    Reviewed: Allergy & Precautions, NPO status , Patient's Chart, lab work & pertinent test results  Airway Mallampati: II       Dental no notable dental hx. (+) Teeth Intact   Pulmonary    Pulmonary exam normal breath sounds clear to auscultation       Cardiovascular hypertension, Pt. on medications Normal cardiovascular exam Rhythm:Regular Rate:Normal     Neuro/Psych    GI/Hepatic GERD  Medicated,  Endo/Other  Morbid obesity  Renal/GU      Musculoskeletal   Abdominal (+) + obese,   Peds  Hematology   Anesthesia Other Findings [name]  ECHO COMPLETE WO IMAGING ENHANCING AGENT  Order# [id number]  Reading physician: Richardo Priest, MD Ordering physician: Park Liter, MD Study date: 11/25/17 Result Notes for ECHOCARDIOGRAM COMPLETE   Notes recorded by Mattie Marlin, RN on 11/29/2017 at 3:39 PM EST Informed patient of the results. ------  Notes recorded by Park Liter, MD on 11/29/2017 at 8:43 AM EST Echo looks fine, EF normal, mild MR   Study Result   Result status: Final result                        [facility]                       [name] [address]                            [phone]  ------------------------------------------------------------------- Echocardiography  Patient:    Catherine [name], [place] MR #:       [id number] Study Date: 11/25/2017 Gender:     F Age:        67 Height:     165.1 cm Weight:     132.5 kg BSA:        2.54 m^2 Pt. Status: Room:   ATTENDING    Jenne Campus, MD  ORDERING     Jenne Campus, MD  REFERRING    Jenne Campus, MD  [place], [place]  SONOGRAPHER  Jimmy Reel, RDCS  cc:  ------------------------------------------------------------------- LV EF: 55% -    60%  ------------------------------------------------------------------- Indications:      Pulmonary hypertension - primary 416.0.  ------------------------------------------------------------------- History:   PMH:   Mitral valve disease.  Risk factors: Hypertension. Dyslipidemia.  ------------------------------------------------------------------- Study Conclusions  - Left ventricle: The cavity size was normal. Wall thickness was   increased in a pattern of mild LVH. Systolic function was normal.   The estimated ejection fraction was in the range of 55% to 60%.   Wall motion was normal; there were no regional wall motion   abnormalities. There was an increased relative contribution of   atrial contraction to ventricular filling. Doppler parameters are   consistent with abnormal left ventricular relaxation (grade 1   diastolic dysfunction). Doppler parameters are consistent with   high ventricular filling pressure. - Aortic valve: Valve area (VTI): 2.8 cm^2. Valve area (Vmax): 2.4   cm^2. Valve area (Vmean): 2.43 cm^2. - Mitral valve: Calcified, mildly fibrotic annulus. Moderately   thickened, moderately calcified leaflets posterior. Mobility of  the posterior leaflet was restricted. There was mild   regurgitation. Valve area by pressure half-time: 2.39 cm^2. Valve   area by continuity equation (using LVOT flow): 2.13 cm^2. - Left atrium: The atrium was mildly dilated. - Tricuspid valve: There was mild regurgitation. Peak RV-RA   gradient (S): 33 mm Hg. - Pulmonary arteries: PA peak pressure: 36 mm Hg (S).  ------------------------------------------------------------------- Study data:  No prior study was available for comparison.  Study status:  Routine.  Procedure:  The patient reported no pain pre or post test. Transthoracic echocardiography. Image quality was adequate.  Study completion:  There were no complications. Echocardiography.  M-mode, complete 2D, spectral  Doppler, and color Doppler.  Birthdate:  Patient birthdate: 1951-04-18.  Age:  Patient is 67 yr old.  Sex:  Gender: female.    BMI: 48.6 kg/m^2.  Blood pressure:     130/68  Patient status:  Outpatient.  Study date: Study date: 11/25/2017. Study time: 01:17 PM.  Location:  Echo    Reproductive/Obstetrics                            Anesthesia Physical Anesthesia Plan  ASA: III  Anesthesia Plan: General   Post-op Pain Management:  Regional for Post-op pain   Induction: Intravenous  PONV Risk Score and Plan: 3 and Ondansetron and Dexamethasone  Airway Management Planned: LMA  Additional Equipment:   Intra-op Plan:   Post-operative Plan: Extubation in OR  Informed Consent: I have reviewed the patients History and Physical, chart, labs and discussed the procedure including the risks, benefits and alternatives for the proposed anesthesia with the patient or authorized representative who has indicated his/her understanding and acceptance.   Dental advisory given  Plan Discussed with: CRNA and Surgeon  Anesthesia Plan Comments:        Anesthesia Quick Evaluation

## 2018-08-11 ENCOUNTER — Ambulatory Visit (HOSPITAL_COMMUNITY)
Admission: RE | Admit: 2018-08-11 | Discharge: 2018-08-11 | Disposition: A | Discharge status: Home / Self Care | Payer: Medicare Other | Source: Ambulatory Visit | Attending: General Surgery | Admitting: General Surgery

## 2018-08-11 ENCOUNTER — Encounter (HOSPITAL_COMMUNITY)
Admission: RE | Admit: 2018-08-11 | Discharge: 2018-08-11 | Disposition: A | Discharge status: Home / Self Care | Payer: Medicare Other | Source: Ambulatory Visit | Attending: General Surgery | Admitting: General Surgery

## 2018-08-11 ENCOUNTER — Ambulatory Visit
Admission: RE | Admit: 2018-08-11 | Discharge: 2018-08-11 | Disposition: A | Discharge status: Home / Self Care | Payer: Medicare Other | Source: Ambulatory Visit | Attending: General Surgery | Admitting: General Surgery

## 2018-08-11 ENCOUNTER — Encounter (HOSPITAL_COMMUNITY)
Admission: RE | Disposition: A | Discharge status: Home / Self Care | Payer: Self-pay | Source: Ambulatory Visit | Attending: General Surgery

## 2018-08-11 ENCOUNTER — Ambulatory Visit (HOSPITAL_COMMUNITY): Payer: Medicare Other | Admitting: Anesthesiology

## 2018-08-11 ENCOUNTER — Encounter (HOSPITAL_COMMUNITY): Payer: Self-pay

## 2018-08-11 DIAGNOSIS — I10 Essential (primary) hypertension: Secondary | ICD-10-CM | POA: Insufficient documentation

## 2018-08-11 DIAGNOSIS — C50411 Malignant neoplasm of upper-outer quadrant of right female breast: Secondary | ICD-10-CM | POA: Diagnosis not present

## 2018-08-11 DIAGNOSIS — Z170 Estrogen receptor positive status [ER+]: Secondary | ICD-10-CM | POA: Insufficient documentation

## 2018-08-11 DIAGNOSIS — K219 Gastro-esophageal reflux disease without esophagitis: Secondary | ICD-10-CM | POA: Insufficient documentation

## 2018-08-11 DIAGNOSIS — G4730 Sleep apnea, unspecified: Secondary | ICD-10-CM | POA: Diagnosis not present

## 2018-08-11 DIAGNOSIS — Z6841 Body Mass Index (BMI) 40.0 and over, adult: Secondary | ICD-10-CM | POA: Insufficient documentation

## 2018-08-11 DIAGNOSIS — Z79899 Other long term (current) drug therapy: Secondary | ICD-10-CM | POA: Insufficient documentation

## 2018-08-11 HISTORY — PX: BREAST LUMPECTOMY WITH RADIOACTIVE SEED AND SENTINEL LYMPH NODE BIOPSY: SHX6550

## 2018-08-11 SURGERY — BREAST LUMPECTOMY WITH RADIOACTIVE SEED AND SENTINEL LYMPH NODE BIOPSY
Anesthesia: General | Site: Breast | Laterality: Right

## 2018-08-11 MED ORDER — LIDOCAINE 2% (20 MG/ML) 5 ML SYRINGE
INTRAMUSCULAR | Status: AC
  Filled 2018-08-11: qty 5

## 2018-08-11 MED ORDER — HYDROMORPHONE HCL 1 MG/ML IJ SOLN
INTRAMUSCULAR | Status: AC
  Filled 2018-08-11: qty 1

## 2018-08-11 MED ORDER — METHYLENE BLUE 0.5 % INJ SOLN
INTRAVENOUS | Status: AC
  Filled 2018-08-11: qty 10

## 2018-08-11 MED ORDER — KETOROLAC TROMETHAMINE 30 MG/ML IJ SOLN
30.0000 mg | Freq: Once | INTRAMUSCULAR | Status: AC | PRN
  Administered 2018-08-11: 30 mg via INTRAVENOUS

## 2018-08-11 MED ORDER — LIDOCAINE 2% (20 MG/ML) 5 ML SYRINGE
INTRAMUSCULAR | Status: DC | PRN
  Administered 2018-08-11 (×2): 50 mg via INTRAVENOUS

## 2018-08-11 MED ORDER — ONDANSETRON HCL 4 MG/2ML IJ SOLN
INTRAMUSCULAR | Status: DC | PRN
  Administered 2018-08-11: 4 mg via INTRAVENOUS

## 2018-08-11 MED ORDER — ENSURE PRE-SURGERY PO LIQD
296.0000 mL | Freq: Once | ORAL | Status: DC
  Filled 2018-08-11: qty 296

## 2018-08-11 MED ORDER — BUPIVACAINE-EPINEPHRINE (PF) 0.25% -1:200000 IJ SOLN
INTRAMUSCULAR | Status: AC
  Filled 2018-08-11: qty 30

## 2018-08-11 MED ORDER — PROMETHAZINE HCL 25 MG/ML IJ SOLN: 6.2500 mg | INTRAMUSCULAR | Status: DC | PRN

## 2018-08-11 MED ORDER — ACETAMINOPHEN 500 MG PO TABS
1000.0000 mg | ORAL_TABLET | ORAL | Status: AC
  Administered 2018-08-11: 1000 mg via ORAL

## 2018-08-11 MED ORDER — GABAPENTIN 100 MG PO CAPS
100.0000 mg | ORAL_CAPSULE | ORAL | Status: AC
  Administered 2018-08-11: 100 mg via ORAL

## 2018-08-11 MED ORDER — MEPERIDINE HCL 50 MG/ML IJ SOLN: 6.2500 mg | INTRAMUSCULAR | Status: DC | PRN

## 2018-08-11 MED ORDER — ONDANSETRON HCL 4 MG/2ML IJ SOLN
INTRAMUSCULAR | Status: AC
  Filled 2018-08-11: qty 2

## 2018-08-11 MED ORDER — 0.9 % SODIUM CHLORIDE (POUR BTL) OPTIME
TOPICAL | Status: DC | PRN
  Administered 2018-08-11: 1000 mL

## 2018-08-11 MED ORDER — DEXAMETHASONE SODIUM PHOSPHATE 10 MG/ML IJ SOLN
INTRAMUSCULAR | Status: DC | PRN
  Administered 2018-08-11: 10 mg via INTRAVENOUS

## 2018-08-11 MED ORDER — FENTANYL CITRATE (PF) 250 MCG/5ML IJ SOLN
INTRAMUSCULAR | Status: AC
  Filled 2018-08-11: qty 5

## 2018-08-11 MED ORDER — FENTANYL CITRATE (PF) 100 MCG/2ML IJ SOLN
INTRAMUSCULAR | Status: DC | PRN
  Administered 2018-08-11: 25 ug via INTRAVENOUS
  Administered 2018-08-11 (×2): 50 ug via INTRAVENOUS
  Administered 2018-08-11 (×5): 25 ug via INTRAVENOUS

## 2018-08-11 MED ORDER — KETOROLAC TROMETHAMINE 30 MG/ML IJ SOLN
INTRAMUSCULAR | Status: AC
  Filled 2018-08-11: qty 1

## 2018-08-11 MED ORDER — PROPOFOL 10 MG/ML IV BOLUS
INTRAVENOUS | Status: DC | PRN
  Administered 2018-08-11: 180 mg via INTRAVENOUS
  Administered 2018-08-11: 20 mg via INTRAVENOUS

## 2018-08-11 MED ORDER — HYDROMORPHONE HCL 1 MG/ML IJ SOLN
0.2500 mg | INTRAMUSCULAR | Status: DC | PRN
  Administered 2018-08-11: 0.25 mg via INTRAVENOUS

## 2018-08-11 MED ORDER — SODIUM CHLORIDE 0.9 % IJ SOLN
INTRAMUSCULAR | Status: AC
  Filled 2018-08-11: qty 10

## 2018-08-11 MED ORDER — MIDAZOLAM HCL 2 MG/2ML IJ SOLN
INTRAMUSCULAR | Status: AC
  Filled 2018-08-11: qty 2

## 2018-08-11 MED ORDER — ACETAMINOPHEN 500 MG PO TABS
ORAL_TABLET | ORAL | Status: AC
  Administered 2018-08-11: 1000 mg via ORAL
  Filled 2018-08-11: qty 2

## 2018-08-11 MED ORDER — LACTATED RINGERS IV SOLN
INTRAVENOUS | Status: DC | PRN
  Administered 2018-08-11: 07:00:00 via INTRAVENOUS

## 2018-08-11 MED ORDER — OXYCODONE HCL 5 MG PO TABS: 5.0000 mg | ORAL_TABLET | Freq: Four times a day (QID) | ORAL | 0 refills | Status: DC | PRN

## 2018-08-11 MED ORDER — BUPIVACAINE-EPINEPHRINE 0.25% -1:200000 IJ SOLN
INTRAMUSCULAR | Status: DC | PRN
  Administered 2018-08-11: 5 mL

## 2018-08-11 MED ORDER — ROPIVACAINE HCL 5 MG/ML IJ SOLN
INTRAMUSCULAR | Status: DC | PRN
  Administered 2018-08-11 (×6): 5 mL via PERINEURAL

## 2018-08-11 MED ORDER — MIDAZOLAM HCL 5 MG/5ML IJ SOLN
INTRAMUSCULAR | Status: DC | PRN
  Administered 2018-08-11: 2 mg via INTRAVENOUS

## 2018-08-11 MED ORDER — PROPOFOL 10 MG/ML IV BOLUS
INTRAVENOUS | Status: AC
  Filled 2018-08-11: qty 40

## 2018-08-11 MED ORDER — GABAPENTIN 100 MG PO CAPS
ORAL_CAPSULE | ORAL | Status: AC
  Administered 2018-08-11: 100 mg via ORAL
  Filled 2018-08-11: qty 1

## 2018-08-11 MED ORDER — TECHNETIUM TC 99M SULFUR COLLOID FILTERED
1.0000 | Freq: Once | INTRAVENOUS | Status: AC | PRN
  Administered 2018-08-11: 1 via INTRADERMAL

## 2018-08-11 MED ORDER — DEXAMETHASONE SODIUM PHOSPHATE 10 MG/ML IJ SOLN
INTRAMUSCULAR | Status: AC
  Filled 2018-08-11: qty 1

## 2018-08-11 SURGICAL SUPPLY — 56 items
ADH SKN CLS APL DERMABOND .7 (GAUZE/BANDAGES/DRESSINGS) ×1
APPLIER CLIP 9.375 MED OPEN (MISCELLANEOUS) ×2
APR CLP MED 9.3 20 MLT OPN (MISCELLANEOUS) ×1
BINDER BREAST LRG (GAUZE/BANDAGES/DRESSINGS) IMPLANT
BINDER BREAST XLRG (GAUZE/BANDAGES/DRESSINGS) IMPLANT
BINDER BREAST XXLRG (GAUZE/BANDAGES/DRESSINGS) ×1 IMPLANT
BLADE SURG 15 STRL LF DISP TIS (BLADE) ×1 IMPLANT
BLADE SURG 15 STRL SS (BLADE) ×2
CANISTER SUCT 3000ML PPV (MISCELLANEOUS) ×2 IMPLANT
CHLORAPREP W/TINT 26ML (MISCELLANEOUS) ×2 IMPLANT
CLIP APPLIE 9.375 MED OPEN (MISCELLANEOUS) ×1 IMPLANT
COVER PROBE W GEL 5X96 (DRAPES) ×2 IMPLANT
COVER SURGICAL LIGHT HANDLE (MISCELLANEOUS) ×2 IMPLANT
COVER WAND RF STERILE (DRAPES) ×1 IMPLANT
DERMABOND ADVANCED (GAUZE/BANDAGES/DRESSINGS) ×1
DERMABOND ADVANCED .7 DNX12 (GAUZE/BANDAGES/DRESSINGS) ×1 IMPLANT
DEVICE DUBIN SPECIMEN MAMMOGRA (MISCELLANEOUS) ×2 IMPLANT
DRAPE CHEST BREAST 15X10 FENES (DRAPES) ×2 IMPLANT
DRAPE UTILITY XL STRL (DRAPES) ×2 IMPLANT
ELECT COATED BLADE 2.86 ST (ELECTRODE) ×2 IMPLANT
ELECT REM PT RETURN 9FT ADLT (ELECTROSURGICAL) ×2
ELECTRODE REM PT RTRN 9FT ADLT (ELECTROSURGICAL) ×1 IMPLANT
GLOVE BIO SURGEON STRL SZ7 (GLOVE) ×4 IMPLANT
GLOVE BIOGEL PI IND STRL 7.5 (GLOVE) ×1 IMPLANT
GLOVE BIOGEL PI INDICATOR 7.5 (GLOVE) ×1
GOWN STRL REUS W/ TWL LRG LVL3 (GOWN DISPOSABLE) ×2 IMPLANT
GOWN STRL REUS W/TWL LRG LVL3 (GOWN DISPOSABLE) ×4
ILLUMINATOR WAVEGUIDE N/F (MISCELLANEOUS) ×1 IMPLANT
KIT BASIN OR (CUSTOM PROCEDURE TRAY) ×2 IMPLANT
KIT MARKER MARGIN INK (KITS) ×2 IMPLANT
MARKER SKIN DUAL TIP RULER LAB (MISCELLANEOUS) ×2 IMPLANT
NDL FILTER BLUNT 18X1 1/2 (NEEDLE) IMPLANT
NDL HYPO 25GX1X1/2 BEV (NEEDLE) ×1 IMPLANT
NDL SAFETY ECLIPSE 18X1.5 (NEEDLE) IMPLANT
NEEDLE FILTER BLUNT 18X 1/2SAF (NEEDLE)
NEEDLE FILTER BLUNT 18X1 1/2 (NEEDLE) IMPLANT
NEEDLE HYPO 18GX1.5 SHARP (NEEDLE)
NEEDLE HYPO 25GX1X1/2 BEV (NEEDLE) ×4 IMPLANT
NS IRRIG 1000ML POUR BTL (IV SOLUTION) ×2 IMPLANT
PACK SURGICAL SETUP 50X90 (CUSTOM PROCEDURE TRAY) ×2 IMPLANT
PENCIL BUTTON HOLSTER BLD 10FT (ELECTRODE) ×2 IMPLANT
SPONGE LAP 18X18 X RAY DECT (DISPOSABLE) ×2 IMPLANT
STRIP CLOSURE SKIN 1/2X4 (GAUZE/BANDAGES/DRESSINGS) ×2 IMPLANT
SUT MNCRL AB 4-0 PS2 18 (SUTURE) ×4 IMPLANT
SUT SILK 2 0 SH (SUTURE) ×1 IMPLANT
SUT VIC AB 2-0 SH 27 (SUTURE) ×8
SUT VIC AB 2-0 SH 27X BRD (SUTURE) IMPLANT
SUT VIC AB 2-0 SH 27XBRD (SUTURE) ×2 IMPLANT
SUT VIC AB 3-0 SH 27 (SUTURE) ×4
SUT VIC AB 3-0 SH 27X BRD (SUTURE) ×2 IMPLANT
SYR BULB 3OZ (MISCELLANEOUS) ×2 IMPLANT
SYR CONTROL 10ML LL (SYRINGE) ×3 IMPLANT
TOWEL OR 17X24 6PK STRL BLUE (TOWEL DISPOSABLE) ×2 IMPLANT
TOWEL OR 17X26 10 PK STRL BLUE (TOWEL DISPOSABLE) ×2 IMPLANT
TUBE CONNECTING 12X1/4 (SUCTIONS) ×2 IMPLANT
YANKAUER SUCT BULB TIP NO VENT (SUCTIONS) ×2 IMPLANT

## 2018-08-11 NOTE — Interval H&P Note (Signed)
History and Physical Interval Note:  08/11/2018 7:04 AM  Catherine [name]  has presented today for surgery, with the diagnosis of RIGHT BREAST CANCER  The various methods of treatment have been discussed with the patient and family. After consideration of risks, benefits and other options for treatment, the patient has consented to  Procedure(s): RIGHT BREAST LUMPECTOMY WITH BRACKETED RADIOACTIVE SEED AND RIGHT SENTINEL LYMPH NODE BIOPSY (Right) as a surgical intervention .  The patient's history has been reviewed, patient examined, no change in status, stable for surgery.  I have reviewed the patient's chart and labs.  Questions were answered to the patient's satisfaction.     Rolm Bookbinder

## 2018-08-11 NOTE — Discharge Instructions (Signed)
[facility] Office Phone Number [phone]  BREAST BIOPSY/ PARTIAL MASTECTOMY: POST OP INSTRUCTIONS Take 400 mg of ibuprofen every 8 hours or 650 mg tylenol every 6 hours for next 72 hours then as needed. Use ice several times daily also. Always review your discharge instruction sheet given to you by the facility where your surgery was performed.  IF YOU HAVE DISABILITY OR FAMILY LEAVE FORMS, YOU MUST BRING THEM TO THE OFFICE FOR PROCESSING.  DO NOT GIVE THEM TO YOUR DOCTOR.  1. A prescription for pain medication may be given to you upon discharge.  Take your pain medication as prescribed, if needed.  If narcotic pain medicine is not needed, then you may take acetaminophen (Tylenol), naprosyn (Alleve) or ibuprofen (Advil) as needed. 2. Take your usually prescribed medications unless otherwise directed 3. If you need a refill on your pain medication, please contact your pharmacy.  They will contact our office to request authorization.  Prescriptions will not be filled after 5pm or on week-ends. 4. You should eat very light the first 24 hours after surgery, such as soup, crackers, pudding, etc.  Resume your normal diet the day after surgery. 5. Most patients will experience some swelling and bruising in the breast.  Ice packs and a good support bra will help.  Wear the breast binder provided or a sports bra for 72 hours day and night.  After that wear a sports bra during the day until you return to the office. Swelling and bruising can take several days to resolve.  6. It is common to experience some constipation if taking pain medication after surgery.  Increasing fluid intake and taking a stool softener will usually help or prevent this problem from occurring.  A mild laxative (Milk of Magnesia or Miralax) should be taken according to package directions if there are no bowel movements after 48 hours. 7. Unless discharge instructions indicate otherwise, you may remove your bandages 48  hours after surgery and you may shower at that time.  You may have steri-strips (small skin tapes) in place directly over the incision.  These strips should be left on the skin for 7-10 days and will come off on their own.  If your surgeon used skin glue on the incision, you may shower in 24 hours.  The glue will flake off over the next 2-3 weeks.  Any sutures or staples will be removed at the office during your follow-up visit. 8. ACTIVITIES:  You may resume regular daily activities (gradually increasing) beginning the next day.  Wearing a good support bra or sports bra minimizes pain and swelling.  You may have sexual intercourse when it is comfortable. a. You may drive when you no longer are taking prescription pain medication, you can comfortably wear a seatbelt, and you can safely maneuver your car and apply brakes. b. RETURN TO WORK:  ______________________________________________________________________________________ 9. You should see your doctor in the office for a follow-up appointment approximately two weeks after your surgery.  Your doctor's nurse will typically make your follow-up appointment when she calls you with your pathology report.  Expect your pathology report 3-4 business days after your surgery.  You may call to check if you do not hear from us after three days. 10. OTHER INSTRUCTIONS: _______________________________________________________________________________________________ _____________________________________________________________________________________________________________________________________ _____________________________________________________________________________________________________________________________________ _____________________________________________________________________________________________________________________________________  WHEN TO CALL DR Laurence Folz: 1. Fever over 101.0 2. Nausea and/or vomiting. 3. Extreme swelling or  bruising. 4. Continued bleeding from incision. 5. Increased pain, redness, or drainage from the incision.  The clinic   staff is available to answer your questions during regular business hours.  Please don't hesitate to call and ask to speak to one of the nurses for clinical concerns.  If you have a medical emergency, go to the nearest emergency room or call 911.  A surgeon from [facility] is always on call at the hospital.  For further questions, please visit [email or web address]  

## 2018-08-11 NOTE — Op Note (Signed)
Preoperative diagnosis: Clinical stage I right breast cancer Postoperative diagnosis: Same as above Procedure: 1.  Right breast seed bracketed lumpectomy 2.  Right deep axillary sentinel lymph node biopsy Surgeon: Dr. Serita Grammes Anesthesia: General with a pectoral block Estimated blood loss: Less than 30 cc Specimens: 1.  Right breast tissue marked with paint 2.  Additional inferior, lateral and medial margins marked short stitch superior, long stitch lateral, double stitch deep 3.  Right breast axillary sentinel lymph nodes with highest count of 400 Complications: None Drains: None Sponge needle count was correct at completion Disposition to recovery in stable condition  Indications: This is a 67 year-old female who is diagnosed on screening mammography with a clinical stage I right breast cancer.  We discussed all of her options and elected to proceed with a lumpectomy and axillary sentinel lymph node biopsy.  She had seeds to bracket this area placed prior to beginning.  I had these mammograms available in the operating room.  Procedure: After informed consent was obtained the patient first underwent a pectoral block.  She underwent injection of technetium in the standard periareolar fashion.  She was administered antibiotics.  SCDs were in place.  She was then placed under general anesthesia without complication.  She was prepped and draped in the standard sterile surgical fashion.  A surgical timeout was then performed.  I located the radioactive seeds in the upper outer quadrant.  I infiltrated Marcaine and made an incision directly overlying the seeds as one of them was very close to the skin. I then used the neoprobe to guide the excision of the seeds and the surrounding tissue with an attempt to get a clear margin.  Once I remove this I thought the seed was closer to some of the margins and I excised these.  Hemostasis was obtained.  I placed some clips in the cavity.  I sutured  this closed with 2-0 Vicryl.  I then closed the skin with 3-0 Vicryl and 4-0 Monocryl.  Glue and Steri-Strips were applied.  I then located the sentinel nodes in the low axilla.  I made an incision after infiltrating Marcaine at the axillary hairline.  I dissected through the axillary fascia.  I then identified what appeared to be several sentinel lymph nodes.  The highest count as listed above.  There was no background radioactivity once I was completed.  There were no palpable lymph nodes either.  I then obtained hemostasis.  I closed down the axillary fascia with a 2-0 Vicryl.  The remaining tissue was closed with 3-0 Vicryl and 4-0 Monocryl.  Glue and Steri-Strips were applied as well.  She tolerated this well was extubated and transferred to the recovery room stable.

## 2018-08-11 NOTE — Transfer of Care (Signed)
Immediate Anesthesia Transfer of Care Note  Patient: Catherine [name]  Procedure(s) Performed: RIGHT BREAST LUMPECTOMY WITH BRACKETED RADIOACTIVE SEED AND RIGHT SENTINEL LYMPH NODE BIOPSY (Right Breast)  Patient Location: PACU  Anesthesia Type:General and Regional  Level of Consciousness: patient cooperative and responds to stimulation  Airway & Oxygen Therapy: Patient Spontanous Breathing and Patient connected to nasal cannula oxygen  Post-op Assessment: Report given to RN, Post -op Vital signs reviewed and stable and Patient moving all extremities X 4  Post vital signs: Reviewed and stable  Last Vitals:  Vitals Value Taken Time  BP 151/54 08/11/2018  9:07 AM  Temp    Pulse 60 08/11/2018  9:09 AM  Resp 11 08/11/2018  9:09 AM  SpO2 100 % 08/11/2018  9:09 AM  Vitals shown include unvalidated device data.  Last Pain:  Vitals:   08/11/18 0905  TempSrc:   PainSc: (P) Asleep      Patients Stated Pain Goal: 4 (29/79/89 2119)  Complications: No apparent anesthesia complications

## 2018-08-11 NOTE — Anesthesia Postprocedure Evaluation (Signed)
Anesthesia Post Note  Patient: Catherine [name]  Procedure(s) Performed: RIGHT BREAST LUMPECTOMY WITH BRACKETED RADIOACTIVE SEED AND RIGHT SENTINEL LYMPH NODE BIOPSY (Right Breast)     Patient location during evaluation: PACU Anesthesia Type: General Level of consciousness: awake Pain management: pain level controlled Vital Signs Assessment: post-procedure vital signs reviewed and stable Respiratory status: spontaneous breathing Cardiovascular status: stable Postop Assessment: no apparent nausea or vomiting Anesthetic complications: no    Last Vitals:  Vitals:   08/11/18 0935 08/11/18 0950  BP: (!) 141/87 (!) 167/73  Pulse: 60 60  Resp: 14 12  Temp:  (!) 36.3 C  SpO2: 97% 95%    Last Pain:  Vitals:   08/11/18 0941  TempSrc:   PainSc: 5    Pain Goal: Patients Stated Pain Goal: 3 (08/11/18 0941)               Costantino Kohlbeck JR,JOHN Mateo Flow

## 2018-08-11 NOTE — H&P (Signed)
67 yof referred by Dr Sabra Heck for new right breast cancer. she has no family history of breast or ovarian cancer. she has no personal history of breast disease. she does believe she has fh of pancreatic cancer. she had her screening mm that showed b density breasts. left is negative. the right breast had calcifications. in right upper outer quadrant there is an area of 3.9x1.4 cm of calcs. first biopsy of calcs shows er/pr pos hg dcis. a second biopsy of the calcifications is ILC that is er/pr pos, her2 negative and Ki is 2%. 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 she is here with her husband today to discuss options.   Past Surgical History Geni Bers Haggett; 06/29/2018 3:08 PM) Breast Biopsy  Right. multiple  Diagnostic Studies History Geni Bers Haggett; 06/29/2018 3:08 PM) Colonoscopy  5-10 years ago Mammogram  1-3 years ago Pap Smear  1-5 years ago  Allergies Geni Bers Haggett; 06/29/2018 3:12 PM) Penicillin G Benzathine & Proc *PENICILLINS*  Anaphylaxis. Augmentin *PENICILLINS*  Nausea. Adhesive Tape  Skin Breakdown Sulfa Antibiotics  Eye Drops *OPHTHALMIC AGENTS*  Statins  Norpramin *ANTIDEPRESSANTS*  Iodine (Antiseptic) *ANTISEPTICS & DISINFECTANTS*  Allergies Reconciled   Medication History (Jacqueline Haggett; 06/29/2018 3:13 PM) Ativan (Oral) Specific strength unknown - Active. NexIUM ('20MG'$  Capsule DR, Oral) Active. Vitamin D (2000UNIT Capsule, Oral) Active. Vascazen (Oral) Specific strength unknown - Active. Medications Reconciled  Social History Geni Bers Haggett; 06/29/2018 3:08 PM) Alcohol use  Occasional alcohol use. Caffeine use  Carbonated beverages, Coffee, Tea. No drug use  Tobacco use  Never smoker.  Family History Geni Bers Haggett; 06/29/2018 3:08 PM) Cerebrovascular Accident  Mother. Diabetes Mellitus  Mother. Heart Disease  Father, Mother. Heart disease in female family member before age 76  Heart disease in female  family member before age 39  Hypertension  Father, Mother, Sister. Kidney Disease  Mother.  Pregnancy / Birth History Geni Bers Haggett; 06/29/2018 3:08 PM) Age at menarche  48 years. Age of menopause  3-60 Contraceptive History  Oral contraceptives. Gravida  0 Irregular periods  Para  0  Other Problems Geni Bers Haggett; 06/29/2018 3:08 PM) Breast Cancer  Gastroesophageal Reflux Disease  Heart murmur  High blood pressure  Hypercholesterolemia  Sleep Apnea  Thyroid Disease     Review of Systems Geni Bers Haggett; 06/29/2018 3:08 PM) General Not Present- Appetite Loss, Chills, Fatigue, Fever, Night Sweats, Weight Gain and Weight Loss. Skin Not Present- Change in Wart/Mole, Dryness, Hives, Jaundice, New Lesions, Non-Healing Wounds, Rash and Ulcer. HEENT Present- Seasonal Allergies and Wears glasses/contact lenses. Not Present- Earache, Hearing Loss, Hoarseness, Nose Bleed, Oral Ulcers, Ringing in the Ears, Sinus Pain, Sore Throat, Visual Disturbances and Yellow Eyes. Respiratory Present- Snoring. Not Present- Bloody sputum, Chronic Cough, Difficulty Breathing and Wheezing. Breast Present- Breast Pain. Not Present- Breast Mass, Nipple Discharge and Skin Changes. Cardiovascular Present- Leg Cramps. Not Present- Chest Pain, Difficulty Breathing Lying Down, Palpitations, Rapid Heart Rate, Shortness of Breath and Swelling of Extremities. Gastrointestinal Not Present- Abdominal Pain, Bloating, Bloody Stool, Change in Bowel Habits, Chronic diarrhea, Constipation, Difficulty Swallowing, Excessive gas, Gets full quickly at meals, Hemorrhoids, Indigestion, Nausea, Rectal Pain and Vomiting. Female Genitourinary Not Present- Frequency, Nocturia, Painful Urination, Pelvic Pain and Urgency. Musculoskeletal Present- Joint Pain. Not Present- Back Pain, Joint Stiffness, Muscle Pain, Muscle Weakness and Swelling of Extremities. Neurological Not Present- Decreased Memory, Fainting,  Headaches, Numbness, Seizures, Tingling, Tremor, Trouble walking and Weakness. Psychiatric Not Present- Anxiety, Bipolar, Change in Sleep Pattern, Depression, Fearful and Frequent crying.  Vitals Geni Bers Haggett; 06/29/2018  3:14 PM) 06/29/2018 3:13 PM Weight: 285 lb Height: 64.5in Height was reported by patient. Body Surface Area: 2.29 m Body Mass Index: 48.16 kg/m  Temp.: 27F(Temporal)  Pulse: 109 (Regular)  P.OX: 98% (Room air) BP: 130/70 (Sitting, Right Wrist, Standard)       Physical Exam Rolm Bookbinder MD; 06/29/2018 9:38 PM) General Mental Status-Alert.  Head and Neck Trachea-midline. Thyroid Gland Characteristics - normal size and consistency.  Eye Sclera/Conjunctiva - Bilateral-No scleral icterus.  Chest and Lung Exam Chest and lung exam reveals -quiet, even and easy respiratory effort with no use of accessory muscles and on auscultation, normal breath sounds, no adventitious sounds and normal vocal resonance.  Breast Nipples Discharge - Bilateral - None. Breast Lump-No Palpable Breast Mass.  Cardiovascular Cardiovascular examination reveals -normal heart sounds, regular rate and rhythm with no murmurs.  Abdomen Note: soft nontender   Neurologic Neurologic evaluation reveals -alert and oriented x 3 with no impairment of recent or remote memory.  Lymphatic Head & Neck  General Head & Neck Lymphatics: Bilateral - Description - Normal. Axillary  General Axillary Region: Bilateral - Description - Normal. Note: no  adenopathy     Assessment & Plan Rolm Bookbinder MD; 06/29/2018 9:52 PM) BREAST CANCER OF UPPER-OUTER QUADRANT OF RIGHT FEMALE BREAST (C50.411) Right seed bracketed lumpectomy, right ax sn biopsy we discussed pathophysiology of breast cancer and all available treatment options including surgery, radiotherapy, chemotherapy, and antiestrogens. I think given area with two biopsies reasonable to get mri  especially with lobular cancer. we discussed this may mean more biopsies. we discussed she needs nodes evaluated and can do with mri. if negative radiologically then can proceed with sentinel node biopsy. we discussed technetium injection, incision at hairline and likely 2-3 nodes. risk of lymphedema about 5%. also shoulder dysfunction. discussed pt. we discussed seed bracketed lumpectomy vs mastectomy. Discussed that lump with neg margin and radiotherapy is equivalent to mastectomy. she does not desire mastectomy. will follow up after mri to ensure candidate. understands up to 10% pos margin risk requiring second surgery. also discussed she will need to see med onc and rad onc at some point.

## 2018-08-11 NOTE — Anesthesia Procedure Notes (Signed)
Anesthesia Regional Block: Pectoralis block   Pre-Anesthetic Checklist: ,, timeout performed, Correct Patient, Correct Site, Correct Laterality, Correct Procedure, Correct Position, site marked, Risks and benefits discussed,  Surgical consent,  Pre-op evaluation,  At surgeon's request and post-op pain management  Laterality: Right and Upper  Prep: chloraprep       Needles:  Injection technique: Single-shot  Needle Type: Echogenic Stimulator Needle     Needle Length: 10cm  Needle Gauge: 21   Needle insertion depth: 3 cm   Additional Needles:   Procedures:,,,, ultrasound used (permanent image in chart),,,,  Narrative:  Start time: 08/11/2018 7:11 AM End time: 08/11/2018 7:21 AM Injection made incrementally with aspirations every 5 mL.  Performed by: Personally  Anesthesiologist: Lyn Hollingshead, MD

## 2018-08-12 ENCOUNTER — Encounter (HOSPITAL_COMMUNITY): Payer: Self-pay | Admitting: General Surgery

## 2018-08-18 ENCOUNTER — Other Ambulatory Visit: Payer: Self-pay | Admitting: Oncology

## 2018-08-18 ENCOUNTER — Encounter: Payer: Self-pay | Admitting: *Deleted

## 2018-08-18 ENCOUNTER — Telehealth: Payer: Self-pay | Admitting: *Deleted

## 2018-08-18 DIAGNOSIS — Z170 Estrogen receptor positive status [ER+]: Secondary | ICD-10-CM

## 2018-08-18 DIAGNOSIS — C50411 Malignant neoplasm of upper-outer quadrant of right female breast: Secondary | ICD-10-CM | POA: Insufficient documentation

## 2018-08-18 HISTORY — DX: Malignant neoplasm of upper-outer quadrant of right female breast: C50.411

## 2018-08-18 HISTORY — DX: Estrogen receptor positive status (ER+): Z17.0

## 2018-08-18 NOTE — Telephone Encounter (Signed)
Received oncotype testing order. Requisition faxed to pathology. Received by Varney Biles.

## 2018-08-25 ENCOUNTER — Telehealth: Payer: Self-pay | Admitting: *Deleted

## 2018-08-25 NOTE — Telephone Encounter (Signed)
Received oncotype score of 21/7%. Physician team notified. Called pt with results and discussed chemotherapy is not recommended. Pt informed she would like to cx appt with Dr. Jana Hakim on 10/28. She is going to see Dr. Hinton Rao on 10/28 since it's closer to home. She is scheduled to see Dr. Orlene Erm on 10/30. Appts confirmed and verified. Denies further needs or questions at this time.

## 2018-08-26 ENCOUNTER — Other Ambulatory Visit: Payer: Self-pay | Admitting: *Deleted

## 2018-08-26 DIAGNOSIS — C50411 Malignant neoplasm of upper-outer quadrant of right female breast: Secondary | ICD-10-CM

## 2018-08-26 DIAGNOSIS — Z170 Estrogen receptor positive status [ER+]: Principal | ICD-10-CM
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
## 2018-08-29 ENCOUNTER — Inpatient Hospital Stay: Payer: Medicare Other

## 2018-08-29 ENCOUNTER — Encounter: Payer: Self-pay | Admitting: Oncology

## 2018-08-29 ENCOUNTER — Encounter (HOSPITAL_COMMUNITY): Payer: Self-pay | Admitting: General Surgery

## 2018-08-29 ENCOUNTER — Inpatient Hospital Stay: Payer: Medicare Other | Attending: Genetic Counselor | Admitting: Oncology

## 2018-08-29 ENCOUNTER — Ambulatory Visit (INDEPENDENT_AMBULATORY_CARE_PROVIDER_SITE_OTHER): Payer: Medicare Other | Admitting: *Deleted

## 2018-08-29 DIAGNOSIS — J309 Allergic rhinitis, unspecified: Secondary | ICD-10-CM

## 2018-08-29 DIAGNOSIS — Z170 Estrogen receptor positive status [ER+]: Secondary | ICD-10-CM | POA: Diagnosis not present

## 2018-08-29 DIAGNOSIS — Z803 Family history of malignant neoplasm of breast: Secondary | ICD-10-CM | POA: Diagnosis not present

## 2018-08-29 DIAGNOSIS — C50411 Malignant neoplasm of upper-outer quadrant of right female breast: Secondary | ICD-10-CM | POA: Diagnosis not present

## 2018-09-10 ENCOUNTER — Other Ambulatory Visit: Payer: Self-pay | Admitting: Obstetrics & Gynecology

## 2018-09-22 ENCOUNTER — Other Ambulatory Visit: Payer: Self-pay

## 2018-09-22 ENCOUNTER — Telehealth: Payer: Self-pay | Admitting: *Deleted

## 2018-09-22 MED ORDER — ICOSAPENT ETHYL 1 G PO CAPS: 1.0000 g | ORAL_CAPSULE | Freq: Three times a day (TID) | ORAL | 1 refills | Status: DC

## 2018-09-22 NOTE — Telephone Encounter (Signed)
*  STAT* If patient is at the pharmacy, call can be transferred to refill team.   1. Which medications need to be refilled? (please list name of each medication and dose if known) Icosapent Ethyl 1 g caps tid  2. Which pharmacy/location (including street and city if local pharmacy) is medication to be sent to?[facility] [address].  3. Do they need a 30 day or 90 day supply? 30 day supply (#90)

## 2018-09-22 NOTE — Telephone Encounter (Signed)
Med refill has been sent. 

## 2018-09-23 ENCOUNTER — Ambulatory Visit (INDEPENDENT_AMBULATORY_CARE_PROVIDER_SITE_OTHER): Payer: Medicare Other | Admitting: *Deleted

## 2018-09-23 DIAGNOSIS — J309 Allergic rhinitis, unspecified: Secondary | ICD-10-CM

## 2018-10-04 ENCOUNTER — Encounter: Payer: Self-pay | Admitting: Obstetrics & Gynecology

## 2018-10-04 ENCOUNTER — Other Ambulatory Visit: Payer: Self-pay

## 2018-10-04 ENCOUNTER — Encounter

## 2018-10-04 ENCOUNTER — Ambulatory Visit (INDEPENDENT_AMBULATORY_CARE_PROVIDER_SITE_OTHER): Payer: Medicare Other | Admitting: Obstetrics & Gynecology

## 2018-10-04 VITALS — BP 156/80 | HR 84 | Resp 18 | Ht 65.0 in | Wt 294.6 lb

## 2018-10-04 DIAGNOSIS — Z01419 Encounter for gynecological examination (general) (routine) without abnormal findings: Secondary | ICD-10-CM

## 2018-10-04 NOTE — Progress Notes (Signed)
67 y.o. G0P0000 Married White or Caucasian female here for annual exam.  Had lumpectomy with Dr. Donne Hazel showing 1.2cm invasive lesion.  Currently undergoing radiation.  Is having this about 6 weeks.  Then will be treated with an adjuvant medication.   Denies vaginal bleeding.    Has gotten her flu shot.    Has chronic knee issues/pain from fall in 1995/1996.  Has been recommended to have knee replacement.  She has her knee tapes today but this is not new.    Stopped her HRT after breast cancer was diagnosed.  Reports she had some LLQ pain after stopping the patch but this has resolved.  Wants to discuss risk for ovarian disease.    Patient's last menstrual period was 11/02/2002 (approximate).          Sexually active: Yes.    The current method of family planning is post menopausal status.    Exercising: Yes.    walking Smoker:  no  Health Maintenance: Pap:  07/27/17 Neg   06/05/15 Neg. HR HPV:neg  History of abnormal Pap:  no MMG:  08/11/18 Right Lumpectomy and Bx - breast cancer.  Colonoscopy:  2009 Normal.  Aware this due.   BMD:   2019.  Reports this was normal.   TDaP:  Allergic  Pneumonia vaccine(s):  No Shingrix:   No.  Had shingles in April, 2019.   Hep C testing: 06/24/16 Neg Screening Labs: Done   reports that she has never smoked. She has never used smokeless tobacco. She reports that she does not drink alcohol or use drugs.  Past Medical History:  Diagnosis Date  . Allergies    grasses, dander, dust, etc, etc  . Anxiety    situational  . Arthritis   . Breast cancer ([place])   . Family history of breast cancer   . Family history of pancreatic cancer   . Fibromyalgia   . GERD (gastroesophageal reflux disease)   . Heart murmur    "not sure"  . Hypertension   . MVP (mitral valve prolapse)   . Obesity   . Perimenopausal   . Shingles    IN aPRIL, LASTED FOR A WEEK  . Sleep apnea    uses CPAP    Past Surgical History:  Procedure Laterality Date  . BREAST  LUMPECTOMY WITH RADIOACTIVE SEED AND SENTINEL LYMPH NODE BIOPSY Right 08/11/2018   Procedure: RIGHT BREAST LUMPECTOMY WITH BRACKETED RADIOACTIVE SEED AND RIGHT SENTINEL LYMPH NODE BIOPSY;  Surgeon: Rolm Bookbinder, MD;  Location: [facility];  Service: General;  Laterality: Right;  . KNEE SURGERY Right 06/1994    Current Outpatient Medications  Medication Sig Dispense Refill  . candesartan (ATACAND) 8 MG tablet Take 1 tablet (8 mg total) by mouth daily. 90 tablet 2  . cetirizine (ZYRTEC) 10 MG tablet Take 10 mg by mouth daily.    . Cholecalciferol (VITAMIN D) 2000 units CAPS Take 2,000 Units by mouth daily.     Marland Kitchen EPINEPHrine (EPIPEN 2-PAK) 0.3 mg/0.3 mL IJ SOAJ injection Inject 0.3 mg into the muscle once.    Marland Kitchen esomeprazole (NEXIUM 24HR) 20 MG capsule Take 20 mg by mouth daily at 12 noon.    Marland Kitchen guaifenesin (HUMIBID E) 400 MG TABS tablet Take 400 mg by mouth daily as needed (for cough/congestion).    Vanessa Kick Ethyl 1 g CAPS Take 1 capsule (1 g total) by mouth 3 (three) times daily. 270 capsule 1  . NASACORT ALLERGY 24HR 55 MCG/ACT AERO nasal inhaler Use  one spray in each nostril once daily as directed. 1 Inhaler 0  . Olopatadine HCl 0.6 % SOLN Can use one to two sprays in each nostril one to two times daily if needed. 30.5 g 11  . oxyCODONE (OXY IR/ROXICODONE) 5 MG immediate release tablet Take 1 tablet (5 mg total) by mouth every 6 (six) hours as needed for moderate pain, severe pain or breakthrough pain. 10 tablet 0  . valACYclovir (VALTREX) 1000 MG tablet Take 1 tablet as directed 30 tablet 3  . ezetimibe (ZETIA) 10 MG tablet Take 1 tablet (10 mg total) by mouth daily. (Patient taking differently: Take 2.5 mg by mouth daily. ) 90 tablet 2   No current facility-administered medications for this visit.     Family History  Problem Relation Age of Onset  . Diabetes Mother   . Hypertension Mother   . Allergic rhinitis Mother   . Kidney failure Mother   . Hypertension Father   . Heart attack  Father   . Infertility Sister   . Cancer Paternal Grandmother   . Asthma Maternal Grandfather   . Pancreatic cancer Maternal Grandfather 72  . Heart disease Paternal Aunt   . Other Cousin        liver cancer/failure?  . Breast cancer Other 80    Review of Systems  All other systems reviewed and are negative.   Exam:   BP (!) 156/80 (BP Location: Left Arm, Patient Position: Sitting, Cuff Size: Large)   Pulse 84   Resp 18   Ht 5\' 5"  (1.651 m)   Wt 294 lb 9.6 oz (133.6 kg)   LMP 11/02/2002 (Approximate)   BMI 49.02 kg/m    Height: 5\' 5"  (165.1 cm)  Ht Readings from Last 3 Encounters:  10/04/18 5\' 5"  (1.651 m)  08/11/18 5\' 5"  (1.651 m)  08/08/18 5\' 5"  (1.651 m)    General appearance: alert, cooperative and appears stated age Head: Normocephalic, without obvious abnormality, atraumatic Neck: no adenopathy, supple, symmetrical, trachea midline and thyroid normal to inspection and palpation Lungs: clear to auscultation bilaterally Breasts: right breast incisions are well healed, no masses, mild skin changes from radiation, no masses.  Left breast without skin changes, LAD, masses, nipple discharge Heart: regular rate and rhythm Abdomen: soft, non-tender; bowel sounds normal; no masses,  no organomegaly Extremities: extremities normal, atraumatic, no cyanosis or edema Skin: Skin color, texture, turgor normal. No rashes or lesions Lymph nodes: Cervical, supraclavicular, and axillary nodes normal. No abnormal inguinal nodes palpated Neurologic: Grossly normal  Pelvic: External genitalia:  no lesions              Urethra:  normal appearing urethra with no masses, tenderness or lesions              Bartholins and Skenes: normal                 Vagina: normal appearing vagina with normal color and discharge, no lesions              Cervix: no lesions              Pap taken: No. Bimanual Exam:  Uterus:  normal size, contour, position, consistency, mobility, non-tender               Adnexa: no mass, fullness, tenderness               Rectovaginal: Confirms  Anus:  normal sphincter tone, no lesions  Chaperone was present for exam.  A:  Well Woman with normal exam PMP, off HRT 1.2cm ER+/PR+, her 2 neg invasive ductal with oncotype testing of 21.  Undergoing radiation.  Will then use AI.  Negative genetic testing 10/19 H/o MVP Hypertension H/o anxiety/depression  P:   Mammogram due in one year Release for most recent BMD obtained pap smear neg 2018.  Not indicate today Blood work is currently UTD Shingles and pneumonia vaccinations discussed.  Pt declines today. Return annually or prn

## 2018-10-14 ENCOUNTER — Ambulatory Visit (INDEPENDENT_AMBULATORY_CARE_PROVIDER_SITE_OTHER): Payer: Medicare Other | Admitting: *Deleted

## 2018-10-14 DIAGNOSIS — J309 Allergic rhinitis, unspecified: Secondary | ICD-10-CM

## 2018-10-19 ENCOUNTER — Ambulatory Visit: Payer: Medicare Other | Admitting: Pulmonary Disease

## 2018-11-07 ENCOUNTER — Ambulatory Visit (INDEPENDENT_AMBULATORY_CARE_PROVIDER_SITE_OTHER): Payer: Medicare Other | Admitting: *Deleted

## 2018-11-07 DIAGNOSIS — J309 Allergic rhinitis, unspecified: Secondary | ICD-10-CM

## 2018-11-16 DIAGNOSIS — Z803 Family history of malignant neoplasm of breast: Secondary | ICD-10-CM

## 2018-11-16 DIAGNOSIS — C50411 Malignant neoplasm of upper-outer quadrant of right female breast: Secondary | ICD-10-CM

## 2018-11-16 DIAGNOSIS — Z170 Estrogen receptor positive status [ER+]: Secondary | ICD-10-CM

## 2018-11-16 DIAGNOSIS — Z923 Personal history of irradiation: Secondary | ICD-10-CM

## 2018-11-25 ENCOUNTER — Ambulatory Visit: Payer: Medicare Other | Admitting: Pulmonary Disease

## 2018-11-25 ENCOUNTER — Encounter: Payer: Self-pay | Admitting: Pulmonary Disease

## 2018-11-25 VITALS — BP 132/82 | HR 78 | Ht 64.0 in | Wt 296.0 lb

## 2018-11-25 DIAGNOSIS — E669 Obesity, unspecified: Secondary | ICD-10-CM | POA: Diagnosis not present

## 2018-11-25 DIAGNOSIS — G4733 Obstructive sleep apnea (adult) (pediatric): Secondary | ICD-10-CM | POA: Diagnosis not present

## 2018-11-25 DIAGNOSIS — G4730 Sleep apnea, unspecified: Secondary | ICD-10-CM

## 2018-11-25 NOTE — Patient Instructions (Signed)
Will have [facility] arrange for Respironics Dreamware Nasal CPAP mask  Follow up in 1 year
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
## 2018-12-09 ENCOUNTER — Ambulatory Visit (INDEPENDENT_AMBULATORY_CARE_PROVIDER_SITE_OTHER): Payer: Medicare Other | Admitting: *Deleted

## 2018-12-09 DIAGNOSIS — J309 Allergic rhinitis, unspecified: Secondary | ICD-10-CM | POA: Diagnosis not present

## 2018-12-23 ENCOUNTER — Telehealth: Payer: Self-pay | Admitting: Pulmonary Disease

## 2018-12-23 NOTE — Telephone Encounter (Signed)
Order placed on 11/25/18 for new dreamwear mask to [facility].   PCCs please advise on status.  Thanks!

## 2018-12-23 NOTE — Telephone Encounter (Signed)
Received vm for [place] at [facility].  She states order was received on 1/24 and she sent it to resupply team.  She states there is no indication that they filled the order.  She has emailed them today & cc'd mgt on email so they will be aware.  She has asked them to call pt today.  I called pt & made her aware she should be hearing from [facility] today.

## 2018-12-23 NOTE — Telephone Encounter (Signed)
Left vm for Sonia Baller at [facility] to check status of order for mask and to give me a call back.

## 2019-01-12 ENCOUNTER — Ambulatory Visit (INDEPENDENT_AMBULATORY_CARE_PROVIDER_SITE_OTHER): Payer: Medicare Other | Admitting: *Deleted

## 2019-01-12 DIAGNOSIS — J309 Allergic rhinitis, unspecified: Secondary | ICD-10-CM | POA: Diagnosis not present

## 2019-01-15 ENCOUNTER — Other Ambulatory Visit: Payer: Self-pay | Admitting: Cardiology

## 2019-01-16 ENCOUNTER — Telehealth: Payer: Self-pay | Admitting: Cardiology

## 2019-01-16 ENCOUNTER — Telehealth: Payer: Self-pay | Admitting: Allergy and Immunology

## 2019-01-16 NOTE — Telephone Encounter (Signed)
Patient is requesting refills on Zetia and Atacand

## 2019-01-16 NOTE — Telephone Encounter (Signed)
Catherine [name] would like Dr. Bruna Potter opinion on whether she should get the pneumonia vaccine.  Please advise.

## 2019-01-16 NOTE — Telephone Encounter (Signed)
Tiarrah informed.

## 2019-01-16 NOTE — Telephone Encounter (Signed)
Need Prevnar and Pneumovax. Usually prevnar first, then pneumovax several months later.

## 2019-01-17 ENCOUNTER — Telehealth: Payer: Self-pay

## 2019-01-17 ENCOUNTER — Other Ambulatory Visit: Payer: Self-pay

## 2019-01-17 MED ORDER — CANDESARTAN CILEXETIL 8 MG PO TABS: 8.0000 mg | ORAL_TABLET | Freq: Every day | ORAL | 2 refills | Status: DC

## 2019-01-17 MED ORDER — EZETIMIBE 10 MG PO TABS: 10.0000 mg | ORAL_TABLET | Freq: Every day | ORAL | 2 refills | Status: DC

## 2019-01-17 NOTE — Addendum Note (Signed)
Addended by: Polly Cobia A on: 01/17/2019 05:19 PM   Modules accepted: Orders

## 2019-01-18 NOTE — Telephone Encounter (Signed)
Entered in error

## 2019-01-27 ENCOUNTER — Ambulatory Visit (INDEPENDENT_AMBULATORY_CARE_PROVIDER_SITE_OTHER): Payer: Medicare Other | Admitting: *Deleted

## 2019-01-27 DIAGNOSIS — J309 Allergic rhinitis, unspecified: Secondary | ICD-10-CM

## 2019-02-13 ENCOUNTER — Ambulatory Visit (INDEPENDENT_AMBULATORY_CARE_PROVIDER_SITE_OTHER): Payer: Medicare Other | Admitting: *Deleted

## 2019-02-13 DIAGNOSIS — J309 Allergic rhinitis, unspecified: Secondary | ICD-10-CM | POA: Diagnosis not present

## 2019-02-23 ENCOUNTER — Ambulatory Visit (INDEPENDENT_AMBULATORY_CARE_PROVIDER_SITE_OTHER): Payer: Medicare Other | Admitting: *Deleted

## 2019-02-23 DIAGNOSIS — J309 Allergic rhinitis, unspecified: Secondary | ICD-10-CM

## 2019-03-09 ENCOUNTER — Ambulatory Visit (INDEPENDENT_AMBULATORY_CARE_PROVIDER_SITE_OTHER): Payer: Medicare Other | Admitting: *Deleted

## 2019-03-09 DIAGNOSIS — J309 Allergic rhinitis, unspecified: Secondary | ICD-10-CM

## 2019-03-20 ENCOUNTER — Ambulatory Visit (INDEPENDENT_AMBULATORY_CARE_PROVIDER_SITE_OTHER): Payer: Medicare Other | Admitting: *Deleted

## 2019-03-20 DIAGNOSIS — J309 Allergic rhinitis, unspecified: Secondary | ICD-10-CM | POA: Diagnosis not present

## 2019-03-22 ENCOUNTER — Telehealth: Payer: Self-pay | Admitting: Cardiology

## 2019-03-22 NOTE — Telephone Encounter (Signed)
Do you want to order anything?

## 2019-03-22 NOTE — Telephone Encounter (Signed)
Patient is having labs drawn at Dr Randel Pigg office next week and wanted Dr Agustin Cree to send orders there for any labhe may need.  Please call patient with questions [phone]

## 2019-03-23 NOTE — Telephone Encounter (Signed)
flp ast alt would be good to see

## 2019-03-24 NOTE — Telephone Encounter (Signed)
Left message informing patient that lab orders have been faxed to [facility]. Advised her to call back with any questions.

## 2019-03-30 DIAGNOSIS — C50411 Malignant neoplasm of upper-outer quadrant of right female breast: Secondary | ICD-10-CM

## 2019-03-30 DIAGNOSIS — Z170 Estrogen receptor positive status [ER+]: Secondary | ICD-10-CM

## 2019-04-07 ENCOUNTER — Ambulatory Visit (INDEPENDENT_AMBULATORY_CARE_PROVIDER_SITE_OTHER): Payer: Medicare Other | Admitting: *Deleted

## 2019-04-07 DIAGNOSIS — J309 Allergic rhinitis, unspecified: Secondary | ICD-10-CM

## 2019-04-11 ENCOUNTER — Telehealth: Payer: Self-pay | Admitting: Cardiology

## 2019-04-11 ENCOUNTER — Other Ambulatory Visit: Payer: Self-pay | Admitting: Cardiology

## 2019-04-11 NOTE — Progress Notes (Signed)
Vials exp 04-10-2020

## 2019-04-11 NOTE — Telephone Encounter (Signed)
Virtual Visit Pre-Appointment Phone Call  "(Name), I am calling you today to discuss your upcoming appointment. We are currently trying to limit exposure to the virus that causes COVID-19 by seeing patients at home rather than in the office."  1. "What is the BEST phone number to call the [name] of the visit?" - include this in appointment notes  2. Do you have or have access to (through a family member/friend) a smartphone with video capability that we can use for your visit?" a. If yes - list this number in appt notes as cell (if different from BEST phone #) and list the appointment type as a VIDEO visit in appointment notes b. If no - list the appointment type as a PHONE visit in appointment notes  Confirm consent - "In the setting of the current Covid19 crisis, you are scheduled for a (phone or video) visit with your provider on (date) at (time).  Just as we do with many in-office visits, in order for you to participate in this visit, we must obtain consent.  If you'd like, I can send this to your mychart (if signed up) or email for you to review.  Otherwise, I can obtain your verbal consent now.  All virtual visits are billed to your insurance company just like a normal visit would be.  By agreeing to a virtual visit, we'd like you to understand that the technology does not allow for your provider to perform an examination, and thus may limit your provider's ability to fully assess your condition. If your provider identifies any concerns that need to be evaluated in person, we will make arrangements to do so.  Finally, though the technology is pretty good, we cannot assure that it will always work on either your or our end, and in the setting of a video visit, we may have to convert it to a phone-only visit.  In either situation, we cannot ensure that we have a secure connection.  Are you willing to proceed?" STAFF: Did the patient verbally acknowledge consent to telehealth visit? Document  YES/NO here:YES 3. Advise patient to be prepared - "Two hours prior to your appointment, go ahead and check your blood pressure, pulse, oxygen saturation, and your weight (if you have the equipment to check those) and write them all down. When your visit starts, your provider will ask you for this information. If you have an Apple Watch or Kardia device, please plan to have heart rate information ready on the [name] of your appointment. Please have a pen and paper handy nearby the [name] of the visit as well."  4. Give patient instructions for MyChart download to smartphone OR [email or web address] as below if video visit (depending on what platform provider is using)  5. Inform patient they will receive a phone call 15 minutes prior to their appointment time (may be from unknown caller ID) so they should be prepared to answer    TELEPHONE CALL NOTE  Catherine [name] has been deemed a candidate for a follow-up tele-health visit to limit community exposure during the Covid-19 pandemic. I spoke with the patient via phone to ensure availability of phone/video source, confirm preferred email & phone number, and discuss instructions and expectations.  I reminded Catherine [name] to be prepared with any vital sign and/or heart rhythm information that could potentially be obtained via home monitoring, at the time of her visit. I reminded Catherine [name] to expect a phone call prior to her visit.  Janine Limbo 04/11/2019 10:01 AM   INSTRUCTIONS FOR DOWNLOADING THE MYCHART APP TO SMARTPHONE  - The patient must first make sure to have activated MyChart and know their login information - If Apple, go to [employer] and type in MyChart in the search bar and download the app. If Android, ask patient to go to [employer] and type in [place] in the search bar and download the app. The app is free but as with any other app downloads, their phone may require them to verify saved payment information or Apple/Android password.  -  The patient will need to then log into the app with their MyChart username and password, and select [facility] as their healthcare provider to link the account. When it is time for your visit, go to the MyChart app, find appointments, and click Begin Video Visit. Be sure to Select Allow for your device to access the Microphone and Camera for your visit. You will then be connected, and your provider will be with you shortly.  **If they have any issues connecting, or need assistance please contact MyChart service desk [phone])**  **If using a computer, in order to ensure the best quality for their visit they will need to use either of the following Internet Browsers: [employer], or Google Chrome**  IF USING DOXIMITY or DOXY.ME - The patient will receive a link just prior to their visit by text.     FULL LENGTH CONSENT FOR TELE-HEALTH VISIT   I hereby voluntarily request, consent and authorize [facility] and its employed or contracted physicians, physician assistants, nurse practitioners or other licensed health care professionals (the Practitioner), to provide me with telemedicine health care services (the Services") as deemed necessary by the treating Practitioner. I acknowledge and consent to receive the Services by the Practitioner via telemedicine. I understand that the telemedicine visit will involve communicating with the Practitioner through live audiovisual communication technology and the disclosure of certain medical information by electronic transmission. I acknowledge that I have been given the opportunity to request an in-person assessment or other available alternative prior to the telemedicine visit and am voluntarily participating in the telemedicine visit.  I understand that I have the right to withhold or withdraw my consent to the use of telemedicine in the course of my care at any time, without affecting my right to future care or treatment, and that the  Practitioner or I may terminate the telemedicine visit at any time. I understand that I have the right to inspect all information obtained and/or recorded in the course of the telemedicine visit and may receive copies of available information for a reasonable fee.  I understand that some of the potential risks of receiving the Services via telemedicine include:   Delay or interruption in medical evaluation due to technological equipment failure or disruption;  Information transmitted may not be sufficient (e.g. poor resolution of images) to allow for appropriate medical decision making by the Practitioner; and/or   In rare instances, security protocols could fail, causing a breach of personal health information.  Furthermore, I acknowledge that it is my responsibility to provide information about my medical history, conditions and care that is complete and accurate to the best of my ability. I acknowledge that Practitioner's advice, recommendations, and/or decision may be based on factors not within their control, such as incomplete or inaccurate data provided by me or distortions of diagnostic images or specimens that may result from electronic transmissions. I understand that the practice  of medicine is not an Chief Strategy Officer and that Practitioner makes no warranties or guarantees regarding treatment outcomes. I acknowledge that I will receive a copy of this consent concurrently upon execution via email to the email address I last provided but may also request a printed copy by calling the office of [facility].    I understand that my insurance will be billed for this visit.   I have read or had this consent read to me.  I understand the contents of this consent, which adequately explains the benefits and risks of the Services being provided via telemedicine.   I have been provided ample opportunity to ask questions regarding this consent and the Services and have had my questions answered to my  satisfaction.  I give my informed consent for the services to be provided through the use of telemedicine in my medical care  By participating in this telemedicine visit I agree to the above.

## 2019-04-12 ENCOUNTER — Telehealth (INDEPENDENT_AMBULATORY_CARE_PROVIDER_SITE_OTHER): Payer: Medicare Other | Admitting: Cardiology

## 2019-04-12 ENCOUNTER — Other Ambulatory Visit: Payer: Self-pay

## 2019-04-12 ENCOUNTER — Encounter: Payer: Self-pay | Admitting: Cardiology

## 2019-04-12 DIAGNOSIS — I272 Pulmonary hypertension, unspecified: Secondary | ICD-10-CM

## 2019-04-12 DIAGNOSIS — C50911 Malignant neoplasm of unspecified site of right female breast: Secondary | ICD-10-CM

## 2019-04-12 DIAGNOSIS — I059 Rheumatic mitral valve disease, unspecified: Secondary | ICD-10-CM

## 2019-04-12 DIAGNOSIS — I10 Essential (primary) hypertension: Secondary | ICD-10-CM

## 2019-04-12 DIAGNOSIS — I270 Primary pulmonary hypertension: Secondary | ICD-10-CM

## 2019-04-12 DIAGNOSIS — G4733 Obstructive sleep apnea (adult) (pediatric): Secondary | ICD-10-CM

## 2019-04-12 DIAGNOSIS — J3089 Other allergic rhinitis: Secondary | ICD-10-CM | POA: Diagnosis not present

## 2019-04-12 NOTE — Progress Notes (Signed)
Virtual Visit via Video Note   This visit type was conducted due to national recommendations for restrictions regarding the COVID-19 Pandemic (e.g. social distancing) in an effort to limit this patient's exposure and mitigate transmission in our community.  Due to her co-morbid illnesses, this patient is at least at moderate risk for complications without adequate follow up.  This format is felt to be most appropriate for this patient at this time.  All issues noted in this document were discussed and addressed.  A limited physical exam was performed with this format.  Please refer to the patient's chart for her consent to telehealth for [facility].  Evaluation Performed:  Follow-up visit  This visit type was conducted due to national recommendations for restrictions regarding the COVID-19 Pandemic (e.g. social distancing).  This format is felt to be most appropriate for this patient at this time.  All issues noted in this document were discussed and addressed.  No physical exam was performed (except for noted visual exam findings with Video Visits).  Please refer to the patient's chart (MyChart message for video visits and phone note for telephone visits) for the patient's consent to telehealth for [facility].  Date:  04/12/2019  ID: Catherine [name], DOB [birth year], MRN [id number]   Patient Location: [place] [address]   Provider location:   [place] [facility]  PCP:  Patient, No Pcp Per  Cardiologist:  Jenne Campus, MD     Chief Complaint: Doing well  History of Present Illness:    Catherine [name] is a 68 y.o. female  who presents via audio/video conferencing for a telehealth visit today.  With history of essential hypertension, obstructive sleep apnea, mild pulmonary hypertension, mild mitral regurgitation overall cardiac wise doing well few months ago she was find to have breast cancer she required surgery and after that radiation but likely that  was very early stage of it.  She coping with the situation quite well and kind of back to normal.  Denies having chest pain tightness squeezing pressure burning chest no shortness of breath no swelling of lower extremities cardiac wise doing well.   The patient does not have symptoms concerning for COVID-19 infection (fever, chills, cough, or new SHORTNESS OF BREATH).    Prior CV studies:   The following studies were reviewed today:  Echocardiogram done in January 2020 showed  Study Conclusions  - Left ventricle: The cavity size was normal. Wall thickness was   increased in a pattern of mild LVH. Systolic function was normal.   The estimated ejection fraction was in the range of 55% to 60%.   Wall motion was normal; there were no regional wall motion   abnormalities. There was an increased relative contribution of   atrial contraction to ventricular filling. Doppler parameters are   consistent with abnormal left ventricular relaxation (grade 1   diastolic dysfunction). Doppler parameters are consistent with   high ventricular filling pressure. - Aortic valve: Valve area (VTI): 2.8 cm^2. Valve area (Vmax): 2.4   cm^2. Valve area (Vmean): 2.43 cm^2. - Mitral valve: Calcified, mildly fibrotic annulus. Moderately   thickened, moderately calcified leaflets posterior. Mobility of   the posterior leaflet was restricted. There was mild   regurgitation. Valve area by pressure half-time: 2.39 cm^2. Valve   area by continuity equation (using LVOT flow): 2.13 cm^2. - Left atrium: The atrium was mildly dilated. - Tricuspid valve: There was mild regurgitation. Peak RV-RA   gradient (  S): 33 mm Hg. - Pulmonary arteries: PA peak pressure: 36 mm Hg (S).   Past Medical History:  Diagnosis Date  . Allergies    grasses, dander, dust, etc, etc  . Anxiety    situational  . Arthritis   . Breast cancer ([place])   . Family history of breast cancer   . Family history of pancreatic cancer   .  Fibromyalgia   . GERD (gastroesophageal reflux disease)   . Heart murmur    "not sure"  . Hypertension   . MVP (mitral valve prolapse)   . Obesity   . Perimenopausal   . Shingles    IN aPRIL, LASTED FOR A WEEK  . Sleep apnea    uses CPAP    Past Surgical History:  Procedure Laterality Date  . BREAST LUMPECTOMY WITH RADIOACTIVE SEED AND SENTINEL LYMPH NODE BIOPSY Right 08/11/2018   Procedure: RIGHT BREAST LUMPECTOMY WITH BRACKETED RADIOACTIVE SEED AND RIGHT SENTINEL LYMPH NODE BIOPSY;  Surgeon: Rolm Bookbinder, MD;  Location: [facility];  Service: General;  Laterality: Right;  . KNEE SURGERY Right 06/1994     Current Meds  Medication Sig  . anastrozole (ARIMIDEX) 1 MG tablet Take 1 mg by mouth daily.  . candesartan (ATACAND) 8 MG tablet Take 1 tablet (8 mg total) by mouth daily.  . cetirizine (ZYRTEC) 10 MG tablet Take 10 mg by mouth daily.  . Cholecalciferol (VITAMIN D) 2000 units CAPS Take 2,000 Units by mouth daily.   Marland Kitchen EPINEPHrine (EPIPEN 2-PAK) 0.3 mg/0.3 mL IJ SOAJ injection Inject 0.3 mg into the muscle once.  Marland Kitchen esomeprazole (NEXIUM 24HR) 20 MG capsule Take 20 mg by mouth daily at 12 noon.  . ezetimibe (ZETIA) 10 MG tablet Take 1 tablet (10 mg total) by mouth daily.  Marland Kitchen guaifenesin (HUMIBID E) 400 MG TABS tablet Take 400 mg by mouth daily as needed (for cough/congestion).  Marland Kitchen NASACORT ALLERGY 24HR 55 MCG/ACT AERO nasal inhaler Use one spray in each nostril once daily as directed.  . Olopatadine HCl 0.6 % SOLN Can use one to two sprays in each nostril one to two times daily if needed.  . valACYclovir (VALTREX) 1000 MG tablet Take 1 tablet as directed  . VASCEPA 1 g CAPS TAKE 1 CAPSULE (1 G TOTAL) BY MOUTH 3 (THREE) TIMES DAILY.      Family History: The patient's family history includes Allergic rhinitis in her mother; Asthma in her maternal grandfather; Breast cancer (age of onset: 54) in an other family member; Cancer in her paternal grandmother; Diabetes in her mother; Heart  attack in her father; Heart disease in her paternal aunt; Hypertension in her father and mother; Infertility in her sister; Kidney failure in her mother; Other in her cousin; Pancreatic cancer (age of onset: 60) in her maternal grandfather.   ROS:   Please see the history of present illness.     All other systems reviewed and are negative.   Labs/Other Tests and Data Reviewed:     Recent Labs: 08/08/2018: BUN 14; Creatinine, Ser 0.75; Hemoglobin 12.4; Platelets 238; Potassium 3.4; Sodium 139  Recent Lipid Panel    Component Value Date/Time   CHOL 179 11/12/2017 0916   TRIG 182 (H) 11/12/2017 0916   HDL 44 11/12/2017 0916   CHOLHDL 4.1 11/12/2017 0916   CHOLHDL 4.8 06/24/2016 1342   VLDL 35 (H) 06/24/2016 1342   LDLCALC 99 11/12/2017 0916      Exam:    Vital Signs:  LMP 11/02/2002 (Approximate)  Wt Readings from Last 3 Encounters:  11/25/18 296 lb (134.3 kg)  10/04/18 294 lb 9.6 oz (133.6 kg)  08/11/18 287 lb 9.6 oz (130.5 kg)     Well nourished, well developed in no acute distress. Alert awake oriented x3 not in any distress sitting in her office talking to me.  Diagnosis for this visit:   1. Mitral valve disease   2. Mild pulmonary hypertension ([place])   3. Essential (primary) hypertension   4. Pulmonary hypertension, primary ([place])   5. OSA (obstructive sleep apnea)   6. Malignant neoplasm of right breast in female, estrogen receptor positive, unspecified site of breast ([place])      ASSESSMENT & PLAN:    1.  Mitral valve disease last echocardiogram showing mild regurgitation.  Some calcification of the valve was present. 2.  History of pulmonary hypertension which was only mild last echocardiogram showed pulmonary pressure of 36 mmHg.  I suspect pulmonary hypertension still related to obstructive sleep apnea.  She really take care of her obstructive sleep apnea that is fine her pulmonary pressures improving. 3.  Essential hypertension blood pressure in the  higher acceptable side.  I asked her to check it on the regular basis and let me know if it became elevated. She thinks her blood pressure is elevated secondary to stress of her breast cancer. 4.  Breast cancer being taken care of excellently by Dr. Hinton Rao  COVID-19 Education: The signs and symptoms of COVID-19 were discussed with the patient and how to seek care for testing (follow up with PCP or arrange E-visit).  The importance of social distancing was discussed today.  Patient Risk:   After full review of this patients clinical status, I feel that they are at least moderate risk at this time.  Time:   Today, I have spent 21 minutes with the patient with telehealth technology discussing pt health issues.  I spent 5 minutes reviewing her chart before the visit.  Visit was finished at 2:48 PM.    Medication Adjustments/Labs and Tests Ordered: Current medicines are reviewed at length with the patient today.  Concerns regarding medicines are outlined above.  No orders of the defined types were placed in this encounter.  Medication changes: No orders of the defined types were placed in this encounter.    Disposition: Follow-up in 1 year  Signed, Park Liter, MD, [facility] 04/12/2019 2:47 PM    [place]

## 2019-04-12 NOTE — Patient Instructions (Signed)
Medication Instructions:  >instcur  If you need a refill on your cardiac medications before your next appointment, please call your pharmacy.   Lab work: None.  If you have labs (blood work) drawn today and your tests are completely normal, you will receive your results only by: Marland Kitchen MyChart Message (if you have MyChart) OR . A paper copy in the mail If you have any lab test that is abnormal or we need to change your treatment, we will call you to review the results.  Testing/Procedures: None.   Follow-Up: At [facility], you and your health needs are our priority.  As part of our continuing mission to provide you with exceptional heart care, we have created designated Provider Care Teams.  These Care Teams include your primary Cardiologist (physician) and Advanced Practice Providers (APPs -  Physician Assistants and Nurse Practitioners) who all work together to provide you with the care you need, when you need it. You will need a follow up appointment in 1 years.  Please call our office 2 months in advance to schedule this appointment.  You may see No primary care provider on file. or another member of our [employer] Provider Team in [place]: Shirlee More, MD . Jyl Heinz, MD  Any Other Special Instructions Will Be Listed Below (If Applicable).

## 2019-04-24 ENCOUNTER — Ambulatory Visit (INDEPENDENT_AMBULATORY_CARE_PROVIDER_SITE_OTHER): Payer: Medicare Other | Admitting: *Deleted

## 2019-04-24 DIAGNOSIS — J309 Allergic rhinitis, unspecified: Secondary | ICD-10-CM | POA: Diagnosis not present

## 2019-05-12 ENCOUNTER — Ambulatory Visit (INDEPENDENT_AMBULATORY_CARE_PROVIDER_SITE_OTHER): Payer: Medicare Other | Admitting: *Deleted

## 2019-05-12 DIAGNOSIS — J309 Allergic rhinitis, unspecified: Secondary | ICD-10-CM

## 2019-05-16 DIAGNOSIS — C50411 Malignant neoplasm of upper-outer quadrant of right female breast: Secondary | ICD-10-CM

## 2019-05-26 ENCOUNTER — Ambulatory Visit (INDEPENDENT_AMBULATORY_CARE_PROVIDER_SITE_OTHER): Payer: Medicare Other | Admitting: *Deleted

## 2019-05-26 DIAGNOSIS — J309 Allergic rhinitis, unspecified: Secondary | ICD-10-CM | POA: Diagnosis not present

## 2019-06-13 ENCOUNTER — Other Ambulatory Visit: Payer: Self-pay | Admitting: Cardiology

## 2019-06-15 ENCOUNTER — Other Ambulatory Visit: Payer: Self-pay | Admitting: Oncology

## 2019-06-15 DIAGNOSIS — C50411 Malignant neoplasm of upper-outer quadrant of right female breast: Secondary | ICD-10-CM

## 2019-06-16 ENCOUNTER — Ambulatory Visit (INDEPENDENT_AMBULATORY_CARE_PROVIDER_SITE_OTHER): Payer: Medicare Other | Admitting: *Deleted

## 2019-06-16 DIAGNOSIS — J309 Allergic rhinitis, unspecified: Secondary | ICD-10-CM | POA: Diagnosis not present

## 2019-06-29 ENCOUNTER — Other Ambulatory Visit: Payer: Self-pay | Admitting: Gastroenterology

## 2019-06-29 DIAGNOSIS — C50411 Malignant neoplasm of upper-outer quadrant of right female breast: Secondary | ICD-10-CM

## 2019-06-29 DIAGNOSIS — Z1211 Encounter for screening for malignant neoplasm of colon: Secondary | ICD-10-CM

## 2019-06-30 ENCOUNTER — Ambulatory Visit (INDEPENDENT_AMBULATORY_CARE_PROVIDER_SITE_OTHER): Payer: Medicare Other | Admitting: *Deleted

## 2019-06-30 DIAGNOSIS — J309 Allergic rhinitis, unspecified: Secondary | ICD-10-CM | POA: Diagnosis not present

## 2019-07-14 ENCOUNTER — Ambulatory Visit (INDEPENDENT_AMBULATORY_CARE_PROVIDER_SITE_OTHER): Payer: Medicare Other | Admitting: *Deleted

## 2019-07-14 DIAGNOSIS — J309 Allergic rhinitis, unspecified: Secondary | ICD-10-CM | POA: Diagnosis not present

## 2019-07-19 ENCOUNTER — Ambulatory Visit
Admission: RE | Admit: 2019-07-19 | Discharge: 2019-07-19 | Disposition: A | Discharge status: Home / Self Care | Payer: Medicare Other | Source: Ambulatory Visit | Attending: Gastroenterology | Admitting: Gastroenterology

## 2019-07-19 DIAGNOSIS — Z1211 Encounter for screening for malignant neoplasm of colon: Secondary | ICD-10-CM

## 2019-07-21 ENCOUNTER — Telehealth: Payer: Self-pay | Admitting: Obstetrics & Gynecology

## 2019-07-21 DIAGNOSIS — N83209 Unspecified ovarian cyst, unspecified side: Secondary | ICD-10-CM

## 2019-07-21 NOTE — Telephone Encounter (Signed)
Patient notified Dr Sabra Heck has reviewed and agrees with plan.   Encounter closed.

## 2019-07-21 NOTE — Telephone Encounter (Signed)
Patient had a "virtual colonoscopy or CT scan." Patient stated that there is a 29mm on her left ovary. Patient stated that [facility] will be sending over the report for Dr. Sabra Heck to advise.

## 2019-07-21 NOTE — Telephone Encounter (Signed)
Dr. Sabra Heck -please review 07/19/19 CT virtual colonoscopy and advise on f/u.

## 2019-07-21 NOTE — Telephone Encounter (Signed)
Finding on ovary is 5cm.  Looks cystic and not worrisome but we will be better able to see on ultrasound is this is part of the ovary or fallopian tube so agree with proceeding with PUS.  Thanks.

## 2019-07-21 NOTE — Telephone Encounter (Signed)
Patient calling to schedule an OV follow up with Dr. Sabra Heck after virtual colonoscopy. Advised her that Dr. Sabra Heck has not had a chance to review the report and we will give her a call to schedule.

## 2019-07-21 NOTE — Telephone Encounter (Signed)
Call to patient. PUS and consult scheduled for 07-25-19. Advised if any change in plan after Dr Sabra Heck reviews, will call her back.

## 2019-07-25 ENCOUNTER — Ambulatory Visit (INDEPENDENT_AMBULATORY_CARE_PROVIDER_SITE_OTHER): Payer: Medicare Other

## 2019-07-25 ENCOUNTER — Ambulatory Visit (INDEPENDENT_AMBULATORY_CARE_PROVIDER_SITE_OTHER): Payer: Medicare Other | Admitting: Obstetrics & Gynecology

## 2019-07-25 ENCOUNTER — Other Ambulatory Visit: Payer: Self-pay

## 2019-07-25 ENCOUNTER — Encounter: Payer: Self-pay | Admitting: Obstetrics & Gynecology

## 2019-07-25 VITALS — BP 156/80 | HR 72 | Temp 97.2°F | Resp 16 | Ht 65.5 in | Wt 289.5 lb

## 2019-07-25 DIAGNOSIS — N83209 Unspecified ovarian cyst, unspecified side: Secondary | ICD-10-CM

## 2019-07-25 DIAGNOSIS — N9489 Other specified conditions associated with female genital organs and menstrual cycle: Secondary | ICD-10-CM

## 2019-07-25 NOTE — Progress Notes (Addendum)
68 y.o. [place] Married White or Caucasian female here for pelvic ultrasound due to adnexal mass that was noted on a virtual CT colonoscopy performed 07/19/2019.  An incidental 5.0cm left adnexal lesion was noted.  No free fluid was noted.  No pelvic LAD was noted.  Pt occasionally has LLQ pain but this has not been present lately.  Denies vaginal bleeding.  Patient's last menstrual period was 11/02/2002 (approximate).  Contraception: PMP  Findings:  UTERUS: 5.0 x 3.5 x 2.3cm EMS: 2.28mm ADNEXA: Left ovary: 5.2 x 4.8 x 4.0cm with 4.6 x 3.6cm simple, thin-walled, avascular cyst       Right ovary: 2.5 x 1.3 x 0.9cm CUL DE SAC: no free fluid noted  Discussion:  Findings reviewed.  In PMP women typically cystic lesions that appear low risk for malignancy are followed when between 5-10cm unless symptomatic.  Typical symptom would include pain which she may have experienced but not recently.  Ca-125 recommended.  If this is norma, will follow with repeat PUS in 3 and 6 months and then on six month intervals for two full years.  If grows or changes or she begins to have pain, laparoscopic removal will be recommended.  Torsion risk discussed including signs and symptoms.  Pt voices clear understanding of plan and is comfortable with plan as well.  Does want copy of note sent to her oncologist.  Assessment:   4.6cm simple left ovarian cyst, avascular H/o right breast cancer  Plan:   Ca-125 obtained today.  If this is normal, will plan to repeat PUS in 3 months.  If this is stable, then plan to repeat in 3 months.  Plan to repeat in 6 months after that for 2 full years to see if there is   ~15 minutes spent with patient >50% of time was in face to face discussion of above.

## 2019-07-26 ENCOUNTER — Encounter: Payer: Self-pay | Admitting: Obstetrics & Gynecology

## 2019-07-26 LAB — CA 125: Cancer Antigen (CA) 125: 8.3 U/mL (ref 0.0–38.1)

## 2019-07-27 ENCOUNTER — Other Ambulatory Visit: Payer: Self-pay | Admitting: *Deleted

## 2019-07-27 DIAGNOSIS — N9489 Other specified conditions associated with female genital organs and menstrual cycle: Secondary | ICD-10-CM

## 2019-07-28 ENCOUNTER — Ambulatory Visit (INDEPENDENT_AMBULATORY_CARE_PROVIDER_SITE_OTHER): Payer: Medicare Other | Admitting: *Deleted

## 2019-07-28 DIAGNOSIS — J309 Allergic rhinitis, unspecified: Secondary | ICD-10-CM

## 2019-08-13 ENCOUNTER — Other Ambulatory Visit: Payer: Self-pay | Admitting: Cardiology

## 2019-08-14 ENCOUNTER — Ambulatory Visit (INDEPENDENT_AMBULATORY_CARE_PROVIDER_SITE_OTHER): Payer: Medicare Other | Admitting: *Deleted

## 2019-08-14 DIAGNOSIS — J309 Allergic rhinitis, unspecified: Secondary | ICD-10-CM

## 2019-09-07 ENCOUNTER — Ambulatory Visit (INDEPENDENT_AMBULATORY_CARE_PROVIDER_SITE_OTHER): Payer: Medicare Other | Admitting: *Deleted

## 2019-09-07 DIAGNOSIS — J309 Allergic rhinitis, unspecified: Secondary | ICD-10-CM

## 2019-09-20 DIAGNOSIS — Z79811 Long term (current) use of aromatase inhibitors: Secondary | ICD-10-CM

## 2019-09-20 DIAGNOSIS — C50411 Malignant neoplasm of upper-outer quadrant of right female breast: Secondary | ICD-10-CM

## 2019-09-20 DIAGNOSIS — Z170 Estrogen receptor positive status [ER+]: Secondary | ICD-10-CM

## 2019-09-22 ENCOUNTER — Ambulatory Visit (INDEPENDENT_AMBULATORY_CARE_PROVIDER_SITE_OTHER): Payer: Medicare Other

## 2019-09-22 DIAGNOSIS — J309 Allergic rhinitis, unspecified: Secondary | ICD-10-CM | POA: Diagnosis not present

## 2019-10-06 ENCOUNTER — Ambulatory Visit (INDEPENDENT_AMBULATORY_CARE_PROVIDER_SITE_OTHER): Payer: Medicare Other | Admitting: *Deleted

## 2019-10-06 DIAGNOSIS — J309 Allergic rhinitis, unspecified: Secondary | ICD-10-CM

## 2019-10-17 ENCOUNTER — Other Ambulatory Visit: Payer: Self-pay

## 2019-10-19 ENCOUNTER — Other Ambulatory Visit: Payer: Self-pay

## 2019-10-19 ENCOUNTER — Ambulatory Visit: Payer: Medicare Other | Admitting: Obstetrics & Gynecology

## 2019-10-19 ENCOUNTER — Encounter: Payer: Self-pay | Admitting: Obstetrics & Gynecology

## 2019-10-19 ENCOUNTER — Ambulatory Visit (INDEPENDENT_AMBULATORY_CARE_PROVIDER_SITE_OTHER): Payer: Medicare Other

## 2019-10-19 ENCOUNTER — Other Ambulatory Visit: Payer: Self-pay | Admitting: Cardiology

## 2019-10-19 VITALS — BP 154/80 | HR 72 | Temp 98.4°F | Resp 14 | Wt 292.4 lb

## 2019-10-19 DIAGNOSIS — N9489 Other specified conditions associated with female genital organs and menstrual cycle: Secondary | ICD-10-CM

## 2019-10-19 DIAGNOSIS — N83202 Unspecified ovarian cyst, left side: Secondary | ICD-10-CM | POA: Diagnosis not present

## 2019-10-19 NOTE — Progress Notes (Signed)
68 y.o. G0P0000 Married White or Caucasian female here for pelvic ultrasound for follow-up of simple appearing 4.6cm thin walled ovarian cyst on left ovary that was discovered with CT scan (while doing virtual colonoscopy) on 07/19/2019.  First ultrasound was 07/25/2019.  She is here for 3 month follow up.  Ca-125 was 8.3 on same day.  Patient's last menstrual period was 11/02/2002 (approximate).  Contraception: PMP  Findings:  UTERUS: 6.4 x 3.5 x 3.0cm EMS:2.35mm ADNEXA: Left ovary: 3.7 x 3.7 x 3.8cm with 4.0 x 3.7 x 4.0cm, simple, avascular, minimal change in size       Right ovary: 1.4 x 1.1 x 0.8cm CUL DE SAC:  No free fluid  Discussion:  Findings reviewed.  With minimal change in size will plan to repeat in 3 months.  If stable, will then repeat every 6 months for 2 full years.  Pt knows I think this is likely a cystadenoma that, at some point, may need removal but will wait until we see change in size before proceeding with any surgery.  Assessment:  4.0cm left simple ovarian cyst Personal hx of breast cancer BMI 49  Plan:  Repeat PUS in three months.  Would do this earlier if starts to have LLQ pain or any new pelvic symptom.  ~15 minutes spent with patient >50% of time was in face to face discussion of above.

## 2019-10-20 ENCOUNTER — Telehealth: Payer: Self-pay | Admitting: *Deleted

## 2019-10-20 NOTE — Telephone Encounter (Signed)
-----   Message from Megan Salon, MD sent at 10/19/2019  4:17 PM EST ----- Regarding: PUS with AEX Can you please schedule this pt a follow up ultrasound for 3 months and schedule her AEX the same day.  I've placed an order for this today.  Thanks.

## 2019-10-20 NOTE — Telephone Encounter (Signed)
Patient is returning a call to Jill. °

## 2019-10-20 NOTE — Telephone Encounter (Signed)
Spoke with patient, PUS and AEX scheduled for 01/25/20 at 3:30pm, consult at 4pm with Dr. Sabra Heck.   02/13/20 AEX cancelled.   Routing to provider for final review. Patient is agreeable to disposition. Will close encounter.  Cc: Lerry Liner, Magdalene Patricia

## 2019-10-20 NOTE — Telephone Encounter (Signed)
Left message to call Raunak Antuna, RN at [facility] [phone].   

## 2019-10-20 NOTE — Telephone Encounter (Signed)
Left message to call Joseluis Alessio, RN at [facility] [phone].   

## 2019-10-21 ENCOUNTER — Other Ambulatory Visit: Payer: Self-pay | Admitting: Cardiology

## 2019-10-23 ENCOUNTER — Ambulatory Visit (INDEPENDENT_AMBULATORY_CARE_PROVIDER_SITE_OTHER): Payer: Medicare Other | Admitting: *Deleted

## 2019-10-23 DIAGNOSIS — J309 Allergic rhinitis, unspecified: Secondary | ICD-10-CM

## 2019-10-24 ENCOUNTER — Other Ambulatory Visit: Payer: Self-pay | Admitting: Cardiology

## 2019-10-24 DIAGNOSIS — J3089 Other allergic rhinitis: Secondary | ICD-10-CM

## 2019-10-24 MED ORDER — CANDESARTAN CILEXETIL 8 MG PO TABS: 8.0000 mg | ORAL_TABLET | Freq: Every day | ORAL | 0 refills | Status: DC

## 2019-10-24 NOTE — Telephone Encounter (Signed)
Candesartan 8 mg daily refilled.

## 2019-10-24 NOTE — Telephone Encounter (Signed)
New Message      *STAT* If patient is at the pharmacy, call can be transferred to refill team.   1. Which medications need to be refilled? (please list name of each medication and dose if known) candesartan (ATACAND) 8 MG tablet  2. Which pharmacy/location (including street and city if local pharmacy) is medication to be sent to? [id number] [place], [place] [facility] [address]  3. Do they need a 30 day or 90 day supply? [place]

## 2019-10-24 NOTE — Progress Notes (Signed)
Vials exp 10-23-20 

## 2019-10-25 DIAGNOSIS — J301 Allergic rhinitis due to pollen: Secondary | ICD-10-CM

## 2019-10-31 IMAGING — MG BREAST SURGICAL SPECIMEN
1 series · 1 of 1 positions shown · non-contrast
Comparison: Previous exam(s).

CLINICAL DATA: Status post lumpectomy today after earlier
radioactive seed localization.

EXAM:
SPECIMEN RADIOGRAPH OF THE RIGHT BREAST

[R]
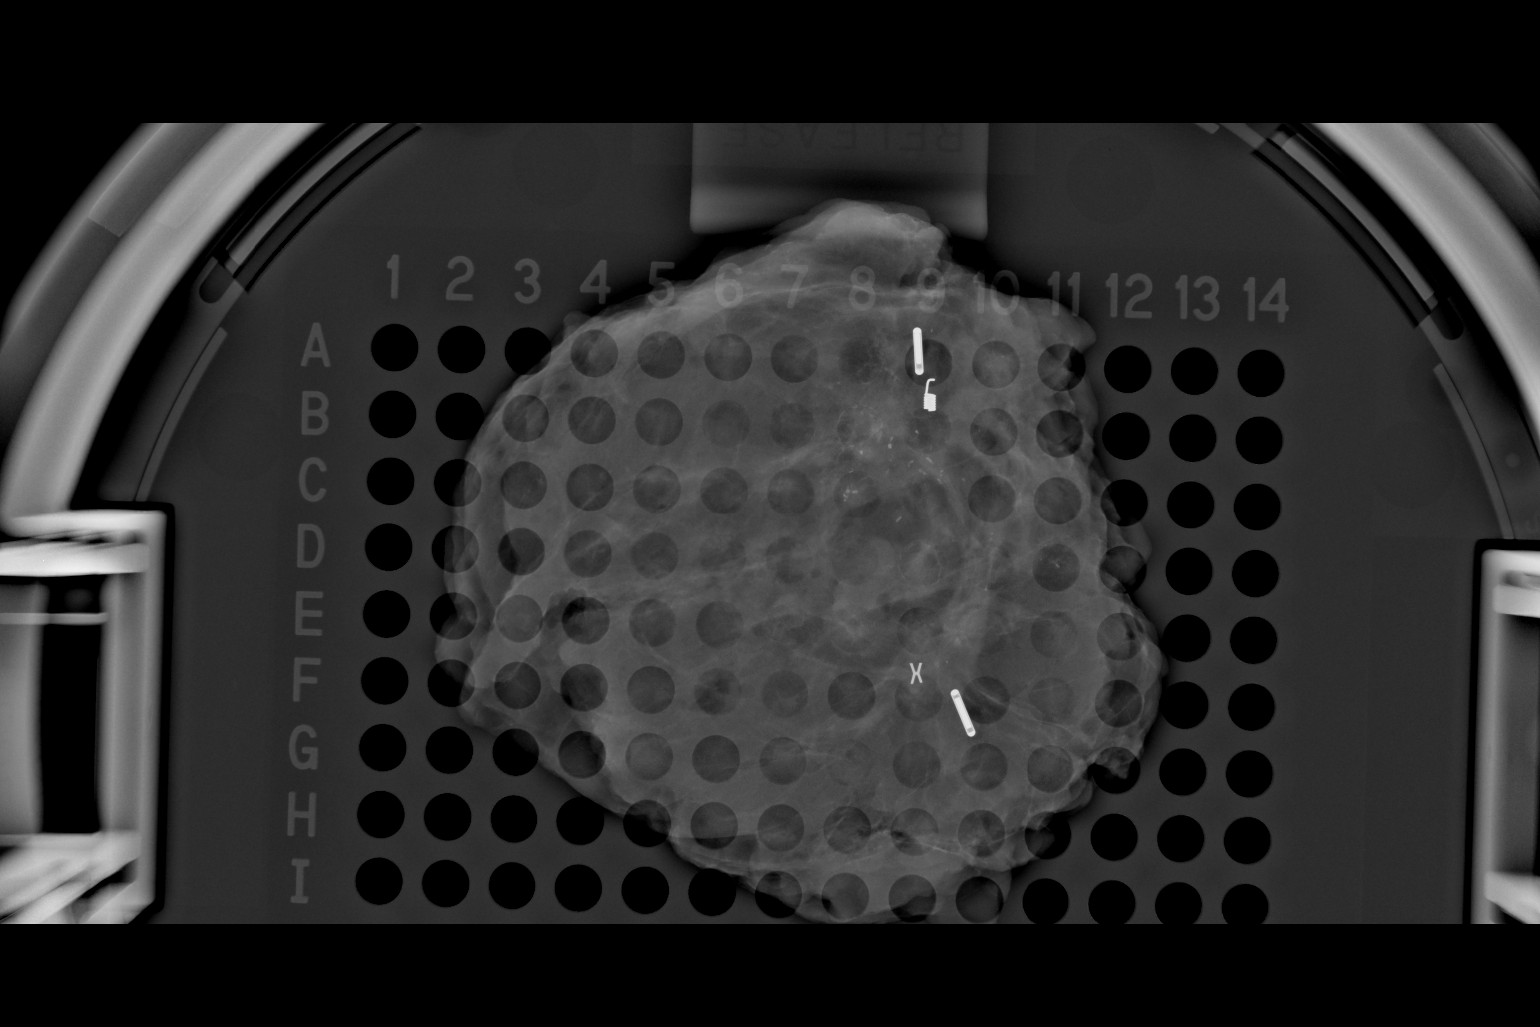

[1 of 1 positions shown; findings below may reference images not displayed]

FINDINGS: Status post excision of the right breast. The 2 radioactive seeds
and biopsy marker clips are present, completely intact, and were
marked for pathology. The locations of the 2 radioactive seeds and
biopsy marker clips within the specimen were discussed with the OR
staff during the procedure.
IMPRESSION: Specimen radiograph of the right breast.

## 2019-11-02 ENCOUNTER — Encounter: Payer: Self-pay | Admitting: Allergy and Immunology

## 2019-11-08 ENCOUNTER — Ambulatory Visit: Payer: Self-pay

## 2019-11-08 ENCOUNTER — Other Ambulatory Visit: Payer: Self-pay

## 2019-11-08 ENCOUNTER — Ambulatory Visit: Payer: Medicare PPO | Admitting: Family Medicine

## 2019-11-08 ENCOUNTER — Encounter: Payer: Self-pay | Admitting: Family Medicine

## 2019-11-08 VITALS — BP 160/85 | HR 73 | Ht 65.0 in | Wt 280.0 lb

## 2019-11-08 DIAGNOSIS — M654 Radial styloid tenosynovitis [de Quervain]: Secondary | ICD-10-CM | POA: Insufficient documentation

## 2019-11-08 DIAGNOSIS — M25532 Pain in left wrist: Secondary | ICD-10-CM

## 2019-11-08 HISTORY — DX: Radial styloid tenosynovitis (de quervain): M65.4

## 2019-11-08 NOTE — Assessment & Plan Note (Signed)
Clinical exam and ultrasound would suggest de Quervain's.  Has improved from when it first started. -Counseled on home exercise therapy and supportive care. -Provided samples of Pennsaid. -Could consider injection if no improvement.

## 2019-11-08 NOTE — Patient Instructions (Signed)
Nice to meet you Please try ice on the area  Please try the rub on medicine. You can try voltaren over the counter if the pennsaid runs out  Please try the exercises   Please send me a message in MyChart with any questions or updates.  Please see me back in 4 weeks or sooner if needed.   --Dr. Raeford Razor
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
## 2019-11-08 NOTE — Progress Notes (Signed)
Medication Samples have been provided to the patient.  Drug name: Pennsaid       Strength: 2%        Qty: 2 Boxes  LOT: [id number]  Exp.Date: 01/2020  Dosing instructions: Use a pea size amount and rub gently..  The patient has been instructed regarding the correct time, dose, and frequency of taking this medication, including desired effects and most common side effects.   Sherrie George, [place] 9:09 AM 11/08/2019

## 2019-11-09 ENCOUNTER — Ambulatory Visit (INDEPENDENT_AMBULATORY_CARE_PROVIDER_SITE_OTHER): Payer: Medicare Other | Admitting: *Deleted

## 2019-11-09 DIAGNOSIS — J309 Allergic rhinitis, unspecified: Secondary | ICD-10-CM | POA: Diagnosis not present

## 2019-11-27 ENCOUNTER — Other Ambulatory Visit: Payer: Self-pay

## 2019-11-27 ENCOUNTER — Ambulatory Visit: Payer: Medicare Other | Admitting: Allergy and Immunology

## 2019-11-27 ENCOUNTER — Encounter: Payer: Self-pay | Admitting: Allergy and Immunology

## 2019-11-27 VITALS — BP 134/64 | HR 68 | Temp 97.7°F | Resp 18 | Ht 65.4 in | Wt 293.0 lb

## 2019-11-27 DIAGNOSIS — K219 Gastro-esophageal reflux disease without esophagitis: Secondary | ICD-10-CM | POA: Diagnosis not present

## 2019-11-27 DIAGNOSIS — J3089 Other allergic rhinitis: Secondary | ICD-10-CM | POA: Diagnosis not present
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
## 2019-11-27 NOTE — Patient Instructions (Addendum)
  1.  Continue immunotherapy  2.  Continue Zyrtec 10 mg tablet 1 time per day if needed  3.  Continue OTC Nasacort 1-2 sprays each nostril 1 time per day if needed  4.  Continue Nexium 20 mg daily  5.  Obtain Covid vaccine  6.  Return to clinic in 1 year or earlier if problem

## 2019-11-28 ENCOUNTER — Encounter: Payer: Self-pay | Admitting: Allergy and Immunology

## 2019-11-30 ENCOUNTER — Ambulatory Visit: Payer: Medicare Other | Admitting: Allergy and Immunology

## 2019-12-02 ENCOUNTER — Ambulatory Visit: Payer: Medicare PPO

## 2019-12-04 ENCOUNTER — Ambulatory Visit (INDEPENDENT_AMBULATORY_CARE_PROVIDER_SITE_OTHER): Payer: Medicare PPO | Admitting: *Deleted

## 2019-12-04 DIAGNOSIS — J309 Allergic rhinitis, unspecified: Secondary | ICD-10-CM | POA: Diagnosis not present

## 2019-12-07 ENCOUNTER — Ambulatory Visit: Payer: Medicare PPO

## 2019-12-20 DIAGNOSIS — C50411 Malignant neoplasm of upper-outer quadrant of right female breast: Secondary | ICD-10-CM | POA: Diagnosis not present

## 2020-01-05 ENCOUNTER — Ambulatory Visit (INDEPENDENT_AMBULATORY_CARE_PROVIDER_SITE_OTHER): Payer: Medicare PPO

## 2020-01-05 DIAGNOSIS — J309 Allergic rhinitis, unspecified: Secondary | ICD-10-CM

## 2020-01-18 ENCOUNTER — Ambulatory Visit (INDEPENDENT_AMBULATORY_CARE_PROVIDER_SITE_OTHER): Payer: Medicare PPO | Admitting: *Deleted

## 2020-01-18 DIAGNOSIS — J309 Allergic rhinitis, unspecified: Secondary | ICD-10-CM

## 2020-01-25 ENCOUNTER — Ambulatory Visit (INDEPENDENT_AMBULATORY_CARE_PROVIDER_SITE_OTHER): Payer: Medicare PPO

## 2020-01-25 ENCOUNTER — Other Ambulatory Visit (HOSPITAL_COMMUNITY)
Admission: RE | Admit: 2020-01-25 | Discharge: 2020-01-25 | Disposition: A | Discharge status: Home / Self Care | Payer: Medicare PPO | Source: Ambulatory Visit | Attending: Obstetrics & Gynecology | Admitting: Obstetrics & Gynecology

## 2020-01-25 ENCOUNTER — Other Ambulatory Visit: Payer: Self-pay

## 2020-01-25 ENCOUNTER — Encounter: Payer: Self-pay | Admitting: Obstetrics & Gynecology

## 2020-01-25 ENCOUNTER — Ambulatory Visit: Payer: Medicare PPO | Admitting: Obstetrics & Gynecology

## 2020-01-25 VITALS — BP 172/78 | HR 72 | Temp 98.1°F | Ht 65.5 in | Wt 291.2 lb

## 2020-01-25 DIAGNOSIS — Z01419 Encounter for gynecological examination (general) (routine) without abnormal findings: Secondary | ICD-10-CM

## 2020-01-25 DIAGNOSIS — N9489 Other specified conditions associated with female genital organs and menstrual cycle: Secondary | ICD-10-CM

## 2020-01-25 DIAGNOSIS — Z124 Encounter for screening for malignant neoplasm of cervix: Secondary | ICD-10-CM | POA: Diagnosis present

## 2020-01-25 DIAGNOSIS — N83202 Unspecified ovarian cyst, left side: Secondary | ICD-10-CM

## 2020-01-25 MED ORDER — VALACYCLOVIR HCL 1 G PO TABS: ORAL_TABLET | ORAL | 3 refills | Status: DC

## 2020-01-25 NOTE — Progress Notes (Signed)
69 y.o. G0P0000 Married White or Caucasian female here for annual exam.  Doing well.  Denies vaginal bleeding.    Has completed her Covid vaccine series.  Did this at [facility].    Saw Dr. Hinton Rao in February.  Is being seen about every 4 months.    Ultrasound results reviewed.   Uterus:  5.4 x 3.8 x 3.7cm with 0.8 x 1.0cm calcified fibroid (was 68mm with prior ultrasound) Endometrial thickness:  3.68mm Left ovary:  3.9 x 4.2 x 2.8cm with 4.0 x 3.6 x 3.5cm, no significant change since 10/2019 Right ovary:  2.1 x 1.4 x 1.5cm  Patient's last menstrual period was 11/02/2002 (approximate).          Sexually active: Yes.    The current method of family planning is post menopausal status.    Exercising: Yes.    walking Smoker:  no  Health Maintenance: Pap:  07/27/17 Neg  06/05/15 Neg:Neg HR HPV History of abnormal Pap:  no MMG:  Done in August 2020 per patient Colonoscopy:  Virtual CT done at [facility] 07/19/2019.  Dr. Cristina Gong ordered this.   BMD:   09/14/18 Normal TDaP:  allergic Pneumonia vaccine(s):  completed Shingrix:   no Hep C testing: 2017 Neg Screening Labs: not needed today   reports that she has never smoked. She has never used smokeless tobacco. She reports that she does not drink alcohol or use drugs.  Past Medical History:  Diagnosis Date  . Allergies    grasses, dander, dust, etc, etc  . Anxiety    situational  . Arthritis   . Breast cancer ([place])   . Family history of breast cancer   . Family history of pancreatic cancer   . Fibromyalgia   . GERD (gastroesophageal reflux disease)   . Heart murmur    "not sure"  . Hypertension   . MVP (mitral valve prolapse)   . Obesity   . Perimenopausal   . Shingles   . Sleep apnea    uses CPAP    Past Surgical History:  Procedure Laterality Date  . BREAST LUMPECTOMY WITH RADIOACTIVE SEED AND SENTINEL LYMPH NODE BIOPSY Right 08/11/2018   Procedure: RIGHT BREAST LUMPECTOMY WITH BRACKETED RADIOACTIVE SEED AND  RIGHT SENTINEL LYMPH NODE BIOPSY;  Surgeon: Rolm Bookbinder, MD;  Location: [facility];  Service: General;  Laterality: Right;  . KNEE SURGERY Right 06/1994    Current Outpatient Medications  Medication Sig Dispense Refill  . anastrozole (ARIMIDEX) 1 MG tablet Take 1 mg by mouth daily.    . calcium carbonate (OS-CAL - DOSED IN MG OF ELEMENTAL CALCIUM) 1250 (500 Ca) MG tablet Take by mouth.    . candesartan (ATACAND) 8 MG tablet Take 1 tablet (8 mg total) by mouth daily. 90 tablet 0  . cetirizine (ZYRTEC) 10 MG tablet Take 10 mg by mouth daily.    . Cholecalciferol (VITAMIN D) 2000 units CAPS Take 2,000 Units by mouth daily.     Marland Kitchen EPINEPHrine (EPIPEN 2-PAK) 0.3 mg/0.3 mL IJ SOAJ injection Inject 0.3 mg into the muscle once.    Marland Kitchen esomeprazole (NEXIUM 24HR) 20 MG capsule Take 20 mg by mouth daily at 12 noon.    . ezetimibe (ZETIA) 10 MG tablet TAKE 1 TABLET BY MOUTH EVERY DAY 90 tablet 2  . guaifenesin (HUMIBID E) 400 MG TABS tablet Take 400 mg by mouth daily as needed (for cough/congestion).    Marland Kitchen NASACORT ALLERGY 24HR 55 MCG/ACT AERO nasal inhaler Use one  spray in each nostril once daily as directed. 1 Inhaler 0  . Olopatadine HCl 0.6 % SOLN Can use one to two sprays in each nostril one to two times daily if needed. 30.5 g 11  . valACYclovir (VALTREX) 1000 MG tablet Take 1 tablet as directed 30 tablet 3  . VASCEPA 1 g capsule TAKE 1 CAPSULE BY MOUTH 3 TIMES DAILY. 270 capsule 1   No current facility-administered medications for this visit.    Family History  Problem Relation Age of Onset  . Diabetes Mother   . Hypertension Mother   . Allergic rhinitis Mother   . Kidney failure Mother   . Hypertension Father   . Heart attack Father   . Infertility Sister   . Cancer Paternal Grandmother   . Asthma Maternal Grandfather   . Pancreatic cancer Maternal Grandfather 72  . Heart disease Paternal Aunt   . Other Cousin        liver cancer/failure?  . Breast cancer Other 80    Review of  Systems  All other systems reviewed and are negative.   Exam:   BP (!) 172/78 (BP Location: Right Arm, Patient Position: Sitting, Cuff Size: Large)   Pulse 72   Temp 98.1 F (36.7 C) (Temporal)   Ht 5' 5.5" (1.664 m)   Wt 291 lb 3.2 oz (132.1 kg)   LMP 11/02/2002 (Approximate)   BMI 47.72 kg/m    Height: 5' 5.5" (166.4 cm)  Ht Readings from Last 3 Encounters:  01/25/20 5' 5.5" (1.664 m)  11/27/19 5' 5.4" (1.661 m)  11/08/19 5\' 5"  (1.651 m)    General appearance: alert, cooperative and appears stated age Head: Normocephalic, without obvious abnormality, atraumatic Neck: no adenopathy, supple, symmetrical, trachea midline and thyroid normal to inspection and palpation Lungs: clear to auscultation bilaterally Breasts: normal appearance, no masses or tenderness Heart: regular rate and rhythm Abdomen: soft, non-tender; bowel sounds normal; no masses,  no organomegaly Extremities: extremities normal, atraumatic, no cyanosis or edema Skin: Skin color, texture, turgor normal. No rashes or lesions Lymph nodes: Cervical, supraclavicular, and axillary nodes normal. No abnormal inguinal nodes palpated Neurologic: Grossly normal   Pelvic: External genitalia:  no lesions              Urethra:  normal appearing urethra with no masses, tenderness or lesions              Bartholins and Skenes: normal                 Vagina: normal appearing vagina with normal color and discharge, no lesions              Cervix: no lesions              Pap taken: Yes.   Bimanual Exam:  Uterus:  normal size, contour, position, consistency, mobility, non-tender              Adnexa: normal adnexa and no mass, fullness, tenderness               Rectovaginal: Confirms               Anus:  normal sphincter tone, no lesions  Chaperone,  Terence Lux, CMA, was present for exam.  A:  Well Woman with normal exam PMP, no HRT 1.2cm ER+/PR+, her 2 neu invasive ductal with oncotype testing of 21.  S/p radiation.   On Arimidex.  Negative genetic testing 10/19. H/O MVP Hypertension H/o anxiety/depression H/o  HSV 1 Left ovarian cyst  P:   Doing yearly MMG pap smear obtained today Plan repeat ultrasound in 6 months.  Will plan to follow ovarian cyst for 2 years to prove stability over two years.  Order placed for ultrasound. Valtrex 1000mg , 2 tab po x 1, repeat in 12 hours.  #30/3RF BMD will be ordered by Dr. Hinton Rao Virtual colonoscopy was done 2020 ordered by Dr. Hinton Rao Return annually or prn

## 2020-01-26 LAB — CYTOLOGY - PAP: Diagnosis: NEGATIVE

## 2020-01-29 ENCOUNTER — Telehealth: Payer: Self-pay | Admitting: Cardiology

## 2020-01-29 ENCOUNTER — Ambulatory Visit (INDEPENDENT_AMBULATORY_CARE_PROVIDER_SITE_OTHER): Payer: Medicare PPO | Admitting: *Deleted

## 2020-01-29 DIAGNOSIS — J309 Allergic rhinitis, unspecified: Secondary | ICD-10-CM | POA: Diagnosis not present

## 2020-01-29 NOTE — Telephone Encounter (Signed)
  Pt c/o swelling: STAT is pt has developed SOB within 24 hours  1) How much weight have you gained and in what time span? No  2) If swelling, where is the swelling located? Both Hands   3) Are you currently taking a fluid pill? No  4) Are you currently SOB? No  5) Do you have a log of your daily weights (if so, list)? No,   6) Have you gained 3 pounds in a day or 5 pounds in a week? No,   7) Have you traveled recently? No  Pt said both hands has been swelling, she didn't gained any weight in fact she lost a pound for the last week. She also mentioned she has Hx of breast cancer in 2019 and he BP has been elevated lately.  Please call

## 2020-01-29 NOTE — Telephone Encounter (Signed)
Spoke with pt and has a flare up of [employer] syndrome in both upper extremities and has a lot of pain and swelling feels this may be why B/P has been elevated( per pt when using cuff and inflates has sig pain in arm )Will continue to monitor B/P and will call back if continues  to run high Will forward to Dr Agustin Cree for review .Adonis Housekeeper

## 2020-02-13 ENCOUNTER — Ambulatory Visit: Payer: Medicare Other | Admitting: Obstetrics & Gynecology

## 2020-02-13 ENCOUNTER — Encounter: Payer: Self-pay | Admitting: Cardiology

## 2020-02-13 ENCOUNTER — Ambulatory Visit: Payer: Medicare PPO | Admitting: Cardiology

## 2020-02-13 ENCOUNTER — Other Ambulatory Visit: Payer: Self-pay

## 2020-02-13 VITALS — BP 140/68 | HR 85 | Ht 65.5 in | Wt 290.0 lb

## 2020-02-13 DIAGNOSIS — I10 Essential (primary) hypertension: Secondary | ICD-10-CM | POA: Diagnosis not present

## 2020-02-13 DIAGNOSIS — I059 Rheumatic mitral valve disease, unspecified: Secondary | ICD-10-CM | POA: Diagnosis not present

## 2020-02-13 DIAGNOSIS — I272 Pulmonary hypertension, unspecified: Secondary | ICD-10-CM | POA: Diagnosis not present

## 2020-02-13 DIAGNOSIS — G4733 Obstructive sleep apnea (adult) (pediatric): Secondary | ICD-10-CM | POA: Diagnosis not present

## 2020-02-13 NOTE — Patient Instructions (Signed)
Medication Instructions:  Your physician recommends that you continue on your current medications as directed. Please refer to the Current Medication list given to you today.  *If you need a refill on your cardiac medications before your next appointment, please call your pharmacy*   Lab Work: None If you have labs (blood work) drawn today and your tests are completely normal, you will receive your results only by: . MyChart Message (if you have MyChart) OR . A paper copy in the mail If you have any lab test that is abnormal or we need to change your treatment, we will call you to review the results.   Testing/Procedures: Your physician has requested that you have an echocardiogram. Echocardiography is a painless test that uses sound waves to create images of your heart. It provides your doctor with information about the size and shape of your heart and how well your heart's chambers and valves are working. This procedure takes approximately one hour. There are no restrictions for this procedure.     Follow-Up: At [facility], you and your health needs are our priority.  As part of our continuing mission to provide you with exceptional heart care, we have created designated Provider Care Teams.  These Care Teams include your primary Cardiologist (physician) and Advanced Practice Providers (APPs -  Physician Assistants and Nurse Practitioners) who all work together to provide you with the care you need, when you need it.  We recommend signing up for the patient portal called "MyChart".  Sign up information is provided on this After Visit Summary.  MyChart is used to connect with patients for Virtual Visits (Telemedicine).  Patients are able to view lab/test results, encounter notes, upcoming appointments, etc.  Non-urgent messages can be sent to your provider as well.   To learn more about what you can do with MyChart, go to [email or web address].    Your next appointment:   1  year(s)  The format for your next appointment:   In Person  Provider:   Robert Krasowski, MD   Other Instructions   Echocardiogram An echocardiogram is a procedure that uses painless sound waves (ultrasound) to produce an image of the heart. Images from an echocardiogram can provide important information about:  Signs of coronary artery disease (CAD).  Aneurysm detection. An aneurysm is a weak or damaged part of an artery wall that bulges out from the normal force of blood pumping through the body.  Heart size and shape. Changes in the size or shape of the heart can be associated with certain conditions, including heart failure, aneurysm, and CAD.  Heart muscle function.  Heart valve function.  Signs of a past heart attack.  Fluid buildup around the heart.  Thickening of the heart muscle.  A tumor or infectious growth around the heart valves. Tell a health care provider about:  Any allergies you have.  All medicines you are taking, including vitamins, herbs, eye drops, creams, and over-the-counter medicines.  Any blood disorders you have.  Any surgeries you have had.  Any medical conditions you have.  Whether you are pregnant or may be pregnant. What are the risks? Generally, this is a safe procedure. However, problems may occur, including:  Allergic reaction to dye (contrast) that may be used during the procedure. What happens before the procedure? No specific preparation is needed. You may eat and drink normally. What happens during the procedure?   An IV tube may be inserted into one of your veins.  You may receive   contrast through this tube. A contrast is an injection that improves the quality of the pictures from your heart.  A gel will be applied to your chest.  A wand-like tool (transducer) will be moved over your chest. The gel will help to transmit the sound waves from the transducer.  The sound waves will harmlessly bounce off of your heart to  allow the heart images to be captured in real-time motion. The images will be recorded on a computer. The procedure may vary among health care providers and hospitals. What happens after the procedure?  You may return to your normal, everyday life, including diet, activities, and medicines, unless your health care provider tells you not to do that. Summary  An echocardiogram is a procedure that uses painless sound waves (ultrasound) to produce an image of the heart.  Images from an echocardiogram can provide important information about the size and shape of your heart, heart muscle function, heart valve function, and fluid buildup around your heart.  You do not need to do anything to prepare before this procedure. You may eat and drink normally.  After the echocardiogram is completed, you may return to your normal, everyday life, unless your health care provider tells you not to do that. This information is not intended to replace advice given to you by your health care provider. Make sure you discuss any questions you have with your health care provider. Document Revised: 02/09/2019 Document Reviewed: 11/21/2016 Elsevier Patient Education  2020 [employer]   

## 2020-02-13 NOTE — Progress Notes (Signed)
Cardiology Office Note:    Date:  02/13/2020   ID:  [place], [place] August 01, 1951, MRN [id number]  PCP:  Patient, No Pcp Per  Cardiologist:  Jenne Campus, MD    Referring MD: No ref. provider found   Chief Complaint  Patient presents with  . Follow-up  Doing fine  History of Present Illness:    Catherine [name] is a 69 y.o. female with past medical history significant for essential hypertension, dyslipidemia, hypothyroidism, also pulmonary hypertension, only mild, obstructive sleep apnea.  Comes today to my office for follow-up.  Overall seems to be doing well.  He does have 5 dogs that she likes and he walks.  Did not notice any decrease in her ability to exercise.  No swelling of lower extremities.  Past Medical History:  Diagnosis Date  . Adiposity 05/26/2014  . Allergic rhinoconjunctivitis 06/28/2015  . Allergies    grasses, dander, dust, etc, etc  . Anxiety    situational  . Arthritis   . Avitaminosis D 05/26/2014  . Breast cancer ([place])   . De Quervain's tenosynovitis, left 11/08/2019  . Elevation of level of transaminase or lactic acid dehydrogenase (LDH) 05/26/2014  . Essential (primary) hypertension 05/26/2014  . Family history of breast cancer   . Family history of pancreatic cancer   . Fatty infiltration of liver 05/26/2014  . Fibromyalgia   . Foot injury, left, initial encounter 05/17/2018  . Genetic testing 08/01/2018   The Common Hereditary Cancer Panel offered by [facility] includes sequencing and/or deletion duplication testing of the following 55 genes: APC, ATM, AXIN2, BARD1, BLM, BMPR1A, BRCA1, BRCA2, BRIP1, BUB1B, CDH1, CDK4, CDKN2A, CEP57, CHEK2, CTNNA1, DICER1, ENG, EPCAM, GALNT12, GREM1, HOXB13, KIT, MEN1, MLH1, MLH3, MSH2, MSH3, MSH6, MUTYH, NBN, NF1, NTHL1, PALB2, PDGFRA, PMS2, POLD1, POLE, PTEN, RAD50,   . GERD (gastroesophageal reflux disease)   . Heart murmur    "not sure"  . HLD (hyperlipidemia) 05/26/2014  . Hypertension   . Laryngopharyngeal reflux  06/28/2015  . Low back pain 05/26/2014  . Malignant neoplasm of upper-outer quadrant of right breast in female, estrogen receptor positive ([place]) 08/18/2018  . Mild pulmonary hypertension ([place]) 12/03/2015   Overview:  48 mmHg in August 2017  Formatting of this note might be different from the original. Overview:  48 mmHg in August 2017 Formatting of this note might be different from the original. 48 mmHg in August 2017  . Mitral valve disease 05/26/2014  . MVP (mitral valve prolapse)   . Myalgia and myositis 05/26/2014  . Nodular goiter, non-toxic 05/26/2014  . Obesity   . OSA (obstructive sleep apnea) 05/26/2014  . Perimenopausal   . Plantar fasciitis 05/26/2014  . Pulmonary hypertension, primary ([place]) 05/24/2017   Most likely related to sleep apnea  . Shingles   . Sleep apnea    uses CPAP  . Treatment-emergent central sleep apnea 06/30/2017    Past Surgical History:  Procedure Laterality Date  . BREAST LUMPECTOMY WITH RADIOACTIVE SEED AND SENTINEL LYMPH NODE BIOPSY Right 08/11/2018   Procedure: RIGHT BREAST LUMPECTOMY WITH BRACKETED RADIOACTIVE SEED AND RIGHT SENTINEL LYMPH NODE BIOPSY;  Surgeon: Rolm Bookbinder, MD;  Location: [facility];  Service: General;  Laterality: Right;  . KNEE SURGERY Right 06/1994    Current Medications: Current Meds  Medication Sig  . anastrozole (ARIMIDEX) 1 MG tablet Take 1 mg by mouth daily.  . calcium carbonate (OS-CAL - DOSED IN MG OF ELEMENTAL CALCIUM) 1250 (500 Ca) MG tablet Take by mouth.  Marland Kitchen  candesartan (ATACAND) 8 MG tablet Take 1 tablet (8 mg total) by mouth daily.  . cetirizine (ZYRTEC) 10 MG tablet Take 10 mg by mouth daily.  . Cholecalciferol (VITAMIN D) 2000 units CAPS Take 2,000 Units by mouth daily.   Marland Kitchen EPINEPHrine (EPIPEN 2-PAK) 0.3 mg/0.3 mL IJ SOAJ injection Inject 0.3 mg into the muscle once.  Marland Kitchen esomeprazole (NEXIUM 24HR) 20 MG capsule Take 20 mg by mouth daily at 12 noon.  . ezetimibe (ZETIA) 10 MG tablet TAKE 1 TABLET BY MOUTH EVERY DAY  .  guaifenesin (HUMIBID E) 400 MG TABS tablet Take 400 mg by mouth daily as needed (for cough/congestion).  Marland Kitchen NASACORT ALLERGY 24HR 55 MCG/ACT AERO nasal inhaler Use one spray in each nostril once daily as directed.  . Olopatadine HCl 0.6 % SOLN Can use one to two sprays in each nostril one to two times daily if needed.  . valACYclovir (VALTREX) 1000 MG tablet Take 1 tablet as directed  . VASCEPA 1 g capsule TAKE 1 CAPSULE BY MOUTH 3 TIMES DAILY.     Allergies:   Penicillin g, Zinacef [cefuroxime in sterile water], Fenofibrate, Rosuvastatin, Tape, Augmentin [amoxicillin-pot clavulanate], Desipramine hcl, Iodine, Sulfa antibiotics, Tetanus toxoids, Avelox [moxifloxacin hcl in nacl], and Diclofenac sodium   Social History   Socioeconomic History  . Marital status: Married    Spouse name: Not on file  . Number of children: Not on file  . Years of education: Not on file  . Highest education level: Not on file  Occupational History  . Not on file  Tobacco Use  . Smoking status: Never Smoker  . Smokeless tobacco: Never Used  Substance and Sexual Activity  . Alcohol use: No  . Drug use: No  . Sexual activity: Yes    Birth control/protection: Post-menopausal  Other Topics Concern  . Not on file  Social History Narrative  . Not on file   Social Determinants of Health   Financial Resource Strain:   . Difficulty of Paying Living Expenses:   Food Insecurity:   . Worried About Charity fundraiser in the Last Year:   . Arboriculturist in the Last Year:   Transportation Needs:   . Film/video editor (Medical):   Marland Kitchen Lack of Transportation (Non-Medical):   Physical Activity:   . Days of Exercise per Week:   . Minutes of Exercise per Session:   Stress:   . Feeling of Stress :   Social Connections:   . Frequency of Communication with Friends and Family:   . Frequency of Social Gatherings with Friends and Family:   . Attends Religious Services:   . Active Member of Clubs or  Organizations:   . Attends Archivist Meetings:   Marland Kitchen Marital Status:      Family History: The patient's family history includes Allergic rhinitis in her mother; Asthma in her maternal grandfather; Breast cancer (age of onset: 45) in an other family member; Cancer in her paternal grandmother; Diabetes in her mother; Heart attack in her father; Heart disease in her paternal aunt; Hypertension in her father and mother; Infertility in her sister; Kidney failure in her mother; Other in her cousin; Pancreatic cancer (age of onset: 70) in her maternal grandfather. ROS:   Please see the history of present illness.    All 14 point review of systems negative except as described per history of present illness  EKGs/Labs/Other Studies Reviewed:      Recent Labs: No results found  for requested labs within last 8760 hours.  Recent Lipid Panel    Component Value Date/Time   CHOL 179 11/12/2017 0916   TRIG 182 (H) 11/12/2017 0916   HDL 44 11/12/2017 0916   CHOLHDL 4.1 11/12/2017 0916   CHOLHDL 4.8 06/24/2016 1342   VLDL 35 (H) 06/24/2016 1342   LDLCALC 99 11/12/2017 0916    Physical Exam:    VS:  BP 140/68 (BP Location: Left Arm, Patient Position: Sitting, Cuff Size: Large)   Pulse 85   Ht 5' 5.5" (1.664 m)   Wt 290 lb (131.5 kg)   LMP 11/02/2002 (Approximate)   SpO2 96%   BMI 47.52 kg/m     Wt Readings from Last 3 Encounters:  02/13/20 290 lb (131.5 kg)  01/25/20 291 lb 3.2 oz (132.1 kg)  11/27/19 293 lb (132.9 kg)     GEN:  Well nourished, well developed in no acute distress HEENT: Normal NECK: No JVD; No carotid bruits LYMPHATICS: No lymphadenopathy CARDIAC: RRR, no murmurs, no rubs, no gallops RESPIRATORY:  Clear to auscultation without rales, wheezing or rhonchi  ABDOMEN: Soft, non-tender, non-distended MUSCULOSKELETAL:  No edema; No deformity  SKIN: Warm and dry LOWER EXTREMITIES: no swelling NEUROLOGIC:  Alert and oriented x 3 PSYCHIATRIC:  Normal affect    ASSESSMENT:    1. Essential (primary) hypertension   2. Mild pulmonary hypertension ([place])   3. Mitral valve disease   4. OSA (obstructive sleep apnea)    PLAN:    In order of problems listed above:  1. Essential hypertension blood pressure slightly on the higher side today.  I will schedule her to have echocardiogram to assess left ventricle ejection fraction, left ventricle hypertrophy as well as pulmonary pressure. 2. Mild pulmonary hypertension most likely related to sleep apnea.  That problem has been successfully managed.  Echocardiogram to be done to recheck pressure. 3. Mitral valve disease I do not hear any murmur echocardiogram done 4. Obstructive sleep apnea; maintain medicine team. 5. Dyslipidemia: Cardiovascular consultants acceptable.  She is taking only Zetia.   Medication Adjustments/Labs and Tests Ordered: Current medicines are reviewed at length with the patient today.  Concerns regarding medicines are outlined above.  No orders of the defined types were placed in this encounter.  Medication changes: No orders of the defined types were placed in this encounter.   Signed, Catherine Liter, MD, [facility] 02/13/2020 2:21 PM    [place]

## 2020-02-19 ENCOUNTER — Ambulatory Visit (INDEPENDENT_AMBULATORY_CARE_PROVIDER_SITE_OTHER): Payer: Medicare PPO | Admitting: *Deleted

## 2020-02-19 DIAGNOSIS — J309 Allergic rhinitis, unspecified: Secondary | ICD-10-CM

## 2020-02-22 ENCOUNTER — Other Ambulatory Visit: Payer: Self-pay | Admitting: Cardiology

## 2020-02-29 ENCOUNTER — Ambulatory Visit (INDEPENDENT_AMBULATORY_CARE_PROVIDER_SITE_OTHER): Payer: Medicare PPO

## 2020-02-29 ENCOUNTER — Other Ambulatory Visit: Payer: Self-pay

## 2020-02-29 DIAGNOSIS — I10 Essential (primary) hypertension: Secondary | ICD-10-CM

## 2020-02-29 DIAGNOSIS — I059 Rheumatic mitral valve disease, unspecified: Secondary | ICD-10-CM

## 2020-02-29 NOTE — Progress Notes (Signed)
2D Echocardiogram has been performed. Jayma Volpi RDCS 

## 2020-03-01 ENCOUNTER — Ambulatory Visit (INDEPENDENT_AMBULATORY_CARE_PROVIDER_SITE_OTHER): Payer: Medicare PPO | Admitting: *Deleted

## 2020-03-01 DIAGNOSIS — J309 Allergic rhinitis, unspecified: Secondary | ICD-10-CM | POA: Diagnosis not present

## 2020-03-05 ENCOUNTER — Ambulatory Visit: Payer: Medicare PPO | Admitting: Orthopaedic Surgery

## 2020-03-05 ENCOUNTER — Other Ambulatory Visit: Payer: Self-pay

## 2020-03-05 ENCOUNTER — Encounter: Payer: Self-pay | Admitting: Orthopaedic Surgery

## 2020-03-05 DIAGNOSIS — M654 Radial styloid tenosynovitis [de Quervain]: Secondary | ICD-10-CM | POA: Diagnosis not present

## 2020-03-05 NOTE — Progress Notes (Signed)
Office Visit Note   Patient: Catherine [name]           Date of Birth: Feb 27, 1951           MRN: [id number] Visit Date: 03/05/2020              Requested by: No referring provider defined for this encounter. PCP: Patient, No Pcp Per   Assessment & Plan: Visit Diagnoses:  1. De Quervain's disease (radial styloid tenosynovitis)     Plan: Ms. [place] has what appears to be bilateral de Quervain's tenosynovitis.  She had initial onset on the left in January and it slowly "improving".  She seen her primary care physician has been wearing splints.  More recently she has developed similar symptoms on the right side which is more symptomatic.  The splints do help.  To date she has had massage, acupuncture and over-the-counter medicines.  She wanted some reassurance that that was the diagnosis and that she was okay to "wait and give it more time.  I think that that would be fine.  I have discussed other treatment options including injections, therapy and even surgical decompression.  She like to continue with her present course and return if no improvement  Follow-Up Instructions: Return if symptoms worsen or fail to improve.   Orders:  No orders of the defined types were placed in this encounter.  No orders of the defined types were placed in this encounter.     Procedures: No procedures performed   Clinical Data: No additional findings.   Subjective: Chief Complaint  Patient presents with  . Right Wrist - Pain  . Left Wrist - Pain  Patient presents today for bilateral wrist pain. She said that they have hurt since January. No known injury, but started while changing a water filter.  She initially began with pain just on the left side. She said that they swell in her wrist and hands. She had difficulty with movement in her fingers and wrist. She saw DrSchmitz at [facility] in [place] in January for her left wrist. She had an ultrasound while there and was told she  had DeQuervain's. She has tried acupuncture, chiropractor, and massage therapy. All of that has helped, but she wanted to make sure that she was on the right track to healing. She initially took over the counter medicine, but stopped that after a week. She is right hand dominant.  HPI  Review of Systems   Objective: Vital Signs: LMP 11/02/2002 (Approximate)   Physical Exam Constitutional:      Appearance: She is well-developed.  Eyes:     Pupils: Pupils are equal, round, and reactive to light.  Pulmonary:     Effort: Pulmonary effort is normal.  Skin:    General: Skin is warm and dry.  Neurological:     Mental Status: She is alert and oriented to person, place, and time.  Psychiatric:        Behavior: Behavior normal.     Ortho Exam awake alert and oriented x3.  Comfortable sitting.  Left wrist with tenderness over the first dorsal extensor compartment and mild Finkelstein's test.  No pain over the median nerve.  Negative Phalen's and Tinel's.  No pain with range of motion of wrist.  No specific dorsal wrist pain.  Full range of motion of hand and no pain or swelling at the base of the thumb or along the metacarpal phalangeal joints.  Neurologically intact.  No  shoulder or elbow pain.  On the right there is more pain over the first dorsal extensor compartment a more pronounced Finklestein's test.  The remainder of the exam was similar to the left which is essentially benign.  No evidence of carpal tunnel or arthritis at the base of the thumb or the metacarpal phalangeal joints.  No evidence of TFCC tear.  Specialty Comments:  No specialty comments available.  Imaging: No results found.   PMFS History: Patient Active Problem List   Diagnosis Date Noted  . De Quervain's disease (radial styloid tenosynovitis) 11/08/2019  . Malignant neoplasm of upper-outer quadrant of right breast in female, estrogen receptor positive ([place]) 08/18/2018  . Genetic testing 08/01/2018  . Breast  cancer ([place]) 07/13/2018  . Family history of breast cancer   . Family history of pancreatic cancer   . Foot injury, left, initial encounter 05/17/2018  . Treatment-emergent central sleep apnea 06/30/2017  . Pulmonary hypertension, primary ([place]) 05/24/2017  . Mild pulmonary hypertension ([place]) 12/03/2015  . Allergic rhinoconjunctivitis 06/28/2015  . Laryngopharyngeal reflux 06/28/2015  . Essential (primary) hypertension 05/26/2014  . Fatty infiltration of liver 05/26/2014  . HLD (hyperlipidemia) 05/26/2014  . Mitral valve disease 05/26/2014  . Elevation of level of transaminase or lactic acid dehydrogenase (LDH) 05/26/2014  . Nodular goiter, non-toxic 05/26/2014  . Adiposity 05/26/2014  . OSA (obstructive sleep apnea) 05/26/2014  . Plantar fasciitis 05/26/2014  . Avitaminosis D 05/26/2014  . Low back pain 05/26/2014  . Myalgia and myositis 05/26/2014   Past Medical History:  Diagnosis Date  . Adiposity 05/26/2014  . Allergic rhinoconjunctivitis 06/28/2015  . Allergies    grasses, dander, dust, etc, etc  . Anxiety    situational  . Arthritis   . Avitaminosis D 05/26/2014  . Breast cancer ([place])   . De Quervain's tenosynovitis, left 11/08/2019  . Elevation of level of transaminase or lactic acid dehydrogenase (LDH) 05/26/2014  . Essential (primary) hypertension 05/26/2014  . Family history of breast cancer   . Family history of pancreatic cancer   . Fatty infiltration of liver 05/26/2014  . Fibromyalgia   . Foot injury, left, initial encounter 05/17/2018  . Genetic testing 08/01/2018   The Common Hereditary Cancer Panel offered by [facility] includes sequencing and/or deletion duplication testing of the following 55 genes: APC, ATM, AXIN2, BARD1, BLM, BMPR1A, BRCA1, BRCA2, BRIP1, BUB1B, CDH1, CDK4, CDKN2A, CEP57, CHEK2, CTNNA1, DICER1, ENG, EPCAM, GALNT12, GREM1, HOXB13, KIT, MEN1, MLH1, MLH3, MSH2, MSH3, MSH6, MUTYH, NBN, NF1, NTHL1, PALB2, PDGFRA, PMS2, POLD1, POLE, PTEN, RAD50,   . GERD  (gastroesophageal reflux disease)   . Heart murmur    "not sure"  . HLD (hyperlipidemia) 05/26/2014  . Hypertension   . Laryngopharyngeal reflux 06/28/2015  . Low back pain 05/26/2014  . Malignant neoplasm of upper-outer quadrant of right breast in female, estrogen receptor positive ([place]) 08/18/2018  . Mild pulmonary hypertension ([place]) 12/03/2015   Overview:  48 mmHg in August 2017  Formatting of this note might be different from the original. Overview:  48 mmHg in August 2017 Formatting of this note might be different from the original. 48 mmHg in August 2017  . Mitral valve disease 05/26/2014  . MVP (mitral valve prolapse)   . Myalgia and myositis 05/26/2014  . Nodular goiter, non-toxic 05/26/2014  . Obesity   . OSA (obstructive sleep apnea) 05/26/2014  . Perimenopausal   . Plantar fasciitis 05/26/2014  . Pulmonary hypertension, primary ([place]) 05/24/2017   Most likely related to sleep apnea  .  Shingles   . Sleep apnea    uses CPAP  . Treatment-emergent central sleep apnea 06/30/2017    Family History  Problem Relation Age of Onset  . Diabetes Mother   . Hypertension Mother   . Allergic rhinitis Mother   . Kidney failure Mother   . Hypertension Father   . Heart attack Father   . Infertility Sister   . Cancer Paternal Grandmother   . Asthma Maternal Grandfather   . Pancreatic cancer Maternal Grandfather 72  . Heart disease Paternal Aunt   . Other Cousin        liver cancer/failure?  . Breast cancer Other 53    Past Surgical History:  Procedure Laterality Date  . BREAST LUMPECTOMY WITH RADIOACTIVE SEED AND SENTINEL LYMPH NODE BIOPSY Right 08/11/2018   Procedure: RIGHT BREAST LUMPECTOMY WITH BRACKETED RADIOACTIVE SEED AND RIGHT SENTINEL LYMPH NODE BIOPSY;  Surgeon: Rolm Bookbinder, MD;  Location: [facility];  Service: General;  Laterality: Right;  . KNEE SURGERY Right 06/1994   Social History   Occupational History  . Not on file  Tobacco Use  . Smoking status: Never Smoker  .  Smokeless tobacco: Never Used  Substance and Sexual Activity  . Alcohol use: No  . Drug use: No  . Sexual activity: Yes    Birth control/protection: Post-menopausal

## 2020-03-11 ENCOUNTER — Ambulatory Visit (INDEPENDENT_AMBULATORY_CARE_PROVIDER_SITE_OTHER): Payer: Medicare PPO | Admitting: *Deleted

## 2020-03-11 DIAGNOSIS — J309 Allergic rhinitis, unspecified: Secondary | ICD-10-CM

## 2020-03-20 ENCOUNTER — Encounter: Payer: Self-pay | Admitting: Obstetrics & Gynecology

## 2020-03-20 DIAGNOSIS — C50411 Malignant neoplasm of upper-outer quadrant of right female breast: Secondary | ICD-10-CM

## 2020-03-25 ENCOUNTER — Ambulatory Visit (INDEPENDENT_AMBULATORY_CARE_PROVIDER_SITE_OTHER): Payer: Medicare PPO

## 2020-03-25 DIAGNOSIS — J309 Allergic rhinitis, unspecified: Secondary | ICD-10-CM | POA: Diagnosis not present

## 2020-04-11 ENCOUNTER — Ambulatory Visit (INDEPENDENT_AMBULATORY_CARE_PROVIDER_SITE_OTHER): Payer: Medicare PPO | Admitting: *Deleted

## 2020-04-11 DIAGNOSIS — J309 Allergic rhinitis, unspecified: Secondary | ICD-10-CM

## 2020-04-16 ENCOUNTER — Other Ambulatory Visit: Payer: Self-pay | Admitting: Cardiology

## 2020-04-21 ENCOUNTER — Other Ambulatory Visit: Payer: Self-pay | Admitting: Obstetrics & Gynecology

## 2020-04-22 NOTE — Telephone Encounter (Signed)
Medication refill request: Valtrex Last AEX:  01/25/20 Dr. Sabra Heck Next AEX: none scheduled Last MMG (if hormonal medication request): n/a Refill authorized: Please advise; Order pended #90 w/0 refills if authorized

## 2020-05-09 ENCOUNTER — Ambulatory Visit (INDEPENDENT_AMBULATORY_CARE_PROVIDER_SITE_OTHER): Payer: Medicare PPO | Admitting: *Deleted

## 2020-05-09 DIAGNOSIS — J309 Allergic rhinitis, unspecified: Secondary | ICD-10-CM

## 2020-05-14 DIAGNOSIS — J3089 Other allergic rhinitis: Secondary | ICD-10-CM

## 2020-05-14 NOTE — Progress Notes (Signed)
VIALS EXP 05-14-21 °

## 2020-05-15 DIAGNOSIS — J301 Allergic rhinitis due to pollen: Secondary | ICD-10-CM

## 2020-05-27 ENCOUNTER — Ambulatory Visit (INDEPENDENT_AMBULATORY_CARE_PROVIDER_SITE_OTHER): Payer: Medicare PPO | Admitting: *Deleted

## 2020-05-27 DIAGNOSIS — J309 Allergic rhinitis, unspecified: Secondary | ICD-10-CM | POA: Diagnosis not present

## 2020-06-13 ENCOUNTER — Telehealth: Payer: Self-pay | Admitting: Obstetrics & Gynecology

## 2020-06-13 ENCOUNTER — Ambulatory Visit (INDEPENDENT_AMBULATORY_CARE_PROVIDER_SITE_OTHER): Payer: Medicare PPO | Admitting: *Deleted

## 2020-06-13 DIAGNOSIS — J309 Allergic rhinitis, unspecified: Secondary | ICD-10-CM | POA: Diagnosis not present

## 2020-06-13 NOTE — Telephone Encounter (Signed)
Spoke with patient regarding benefits for recommended ultrasound. Patient was rushed and declined to call back to office at a better time stating, "If you have something to tell me, just say it." Patient acknowledges understanding of information presented. Patient scheduled appointment for 06/27/2020 at 0300PM with M. Edwinna Areola, MD. Encounter closed.

## 2020-06-25 ENCOUNTER — Ambulatory Visit: Payer: Medicare PPO | Admitting: Pulmonary Disease

## 2020-06-25 ENCOUNTER — Encounter: Payer: Self-pay | Admitting: Pulmonary Disease

## 2020-06-25 ENCOUNTER — Other Ambulatory Visit: Payer: Self-pay

## 2020-06-25 VITALS — BP 136/74 | HR 81 | Temp 97.5°F | Ht 65.0 in | Wt 294.8 lb

## 2020-06-25 DIAGNOSIS — Z9989 Dependence on other enabling machines and devices: Secondary | ICD-10-CM | POA: Diagnosis not present

## 2020-06-25 DIAGNOSIS — G4733 Obstructive sleep apnea (adult) (pediatric): Secondary | ICD-10-CM

## 2020-06-25 DIAGNOSIS — E669 Obesity, unspecified: Secondary | ICD-10-CM | POA: Diagnosis not present

## 2020-06-25 DIAGNOSIS — G4730 Sleep apnea, unspecified: Secondary | ICD-10-CM

## 2020-06-25 NOTE — Patient Instructions (Signed)
Will arrange for new CPAP mask and supplies through [facility]  Follow up in 1 year
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
## 2020-06-27 ENCOUNTER — Ambulatory Visit (INDEPENDENT_AMBULATORY_CARE_PROVIDER_SITE_OTHER): Payer: Medicare PPO

## 2020-06-27 ENCOUNTER — Other Ambulatory Visit: Payer: Self-pay

## 2020-06-27 ENCOUNTER — Other Ambulatory Visit: Payer: Medicare PPO | Admitting: Obstetrics & Gynecology

## 2020-06-27 ENCOUNTER — Encounter: Payer: Self-pay | Admitting: Obstetrics & Gynecology

## 2020-06-27 ENCOUNTER — Other Ambulatory Visit: Payer: Medicare PPO

## 2020-06-27 ENCOUNTER — Ambulatory Visit: Payer: Medicare PPO | Admitting: Obstetrics & Gynecology

## 2020-06-27 VITALS — BP 114/66 | HR 70 | Resp 16 | Wt 292.0 lb

## 2020-06-27 DIAGNOSIS — N9489 Other specified conditions associated with female genital organs and menstrual cycle: Secondary | ICD-10-CM | POA: Diagnosis not present

## 2020-06-27 DIAGNOSIS — N83209 Unspecified ovarian cyst, unspecified side: Secondary | ICD-10-CM | POA: Diagnosis not present

## 2020-06-27 DIAGNOSIS — N83202 Unspecified ovarian cyst, left side: Secondary | ICD-10-CM | POA: Diagnosis not present

## 2020-06-27 NOTE — Progress Notes (Signed)
69 y.o. G0P0000 Married White or Caucasian female here for pelvic ultrasound to recheck left ovarian cyst.  This was initially noted on CT done for virtual colonoscopy 07/2019.  She was initially followed at 3 month intervals and now is having six month interval ultrasound.  She will be followed for 2 full years before stopping--as long as there is no significant change.    Denies vaginal bleeding or pelvic pain.  Patient's last menstrual period was 11/02/2002 (approximate).  Contraception: PMP  Findings:  UTERUS: 5.3 x 3.8 x 2.6cm with 0.100mm intramural fibroid EMS: 1.54mm, thin ADNEXA: Left ovary: 4.5 x 4.3 x 3.9cm with 4.0 x 3.6 x 4.2cm simple cyst       Right ovary: 1.9 x 1.2 x 1.1cm CUL DE SAC: no free fluid  Discussion:  Ultrasonographer supervised.  Pt and I reviewed imaging.  Cyst is stable and unchanged.  Will obtained ca-125 today.  If normal, will plan to repeat in 6 months.  Assessment:  Left simple ovarian cyst  Plan:  Ca-125 obtained today Plan to repeat in 6 months  15 minutes spent in discussion with pt today

## 2020-06-28 ENCOUNTER — Ambulatory Visit (INDEPENDENT_AMBULATORY_CARE_PROVIDER_SITE_OTHER): Payer: Medicare PPO

## 2020-06-28 DIAGNOSIS — J309 Allergic rhinitis, unspecified: Secondary | ICD-10-CM

## 2020-06-28 LAB — CA 125: Cancer Antigen (CA) 125: 8.2 U/mL (ref 0.0–38.1)

## 2020-07-01 ENCOUNTER — Telehealth: Payer: Self-pay

## 2020-07-01 NOTE — Telephone Encounter (Signed)
Patient is calling in regards to blood test results.

## 2020-07-01 NOTE — Telephone Encounter (Signed)
Left message for pt to return call to triage RN. 

## 2020-07-01 NOTE — Telephone Encounter (Addendum)
Spoke with pt. Pt given results and recommendations per Dr Sabra Heck.  Pt confirmed Dr Meredith Pel new address and fax number. Pt advised records and labs will be sent to her today via fax. Pt agreeable and verbalized understanding.   Encounter closed.   Megan Salon, MD  06/30/2020 11:20 PM EDT Back to Top    Please let pt know her ca-125 was normal. Pt wanted OV note sent to Dr. Hosie Poisson. She is in Epic so note has been cc'ed. Pt also requested note sent to Dr. Alanson Aly, [place]. [address], [address]. Fax: [phone]. Dr. Meredith Pel is also in [place] but at a different address. Pt stated Dr. Meredith Pel just recently went out on her own. Please obtained correct document for sending note to Dr. Meredith Pel and confirm address with pt when call for this result. Thanks.

## 2020-07-03 DIAGNOSIS — C50411 Malignant neoplasm of upper-outer quadrant of right female breast: Secondary | ICD-10-CM | POA: Diagnosis not present

## 2020-07-15 ENCOUNTER — Ambulatory Visit (INDEPENDENT_AMBULATORY_CARE_PROVIDER_SITE_OTHER): Payer: Medicare PPO | Admitting: *Deleted

## 2020-07-15 DIAGNOSIS — J309 Allergic rhinitis, unspecified: Secondary | ICD-10-CM | POA: Diagnosis not present

## 2020-07-26 ENCOUNTER — Other Ambulatory Visit: Payer: Self-pay | Admitting: Cardiology

## 2020-07-29 ENCOUNTER — Ambulatory Visit (INDEPENDENT_AMBULATORY_CARE_PROVIDER_SITE_OTHER): Payer: Medicare PPO

## 2020-07-29 DIAGNOSIS — J309 Allergic rhinitis, unspecified: Secondary | ICD-10-CM | POA: Diagnosis not present

## 2020-08-07 ENCOUNTER — Other Ambulatory Visit: Payer: Self-pay | Admitting: Pharmacist

## 2020-08-07 DIAGNOSIS — Z23 Encounter for immunization: Secondary | ICD-10-CM

## 2020-08-12 ENCOUNTER — Ambulatory Visit (INDEPENDENT_AMBULATORY_CARE_PROVIDER_SITE_OTHER): Payer: Medicare PPO

## 2020-08-12 DIAGNOSIS — J309 Allergic rhinitis, unspecified: Secondary | ICD-10-CM | POA: Diagnosis not present

## 2020-08-15 ENCOUNTER — Other Ambulatory Visit: Payer: Self-pay

## 2020-08-15 ENCOUNTER — Inpatient Hospital Stay: Payer: Medicare PPO | Attending: Oncology

## 2020-08-15 DIAGNOSIS — Z23 Encounter for immunization: Secondary | ICD-10-CM | POA: Insufficient documentation

## 2020-08-15 MED ORDER — INFLUENZA VAC SPLIT QUAD 0.5 ML IM SUSY
0.5000 mL | PREFILLED_SYRINGE | Freq: Once | INTRAMUSCULAR | Status: AC
  Administered 2020-08-15: 0.5 mL via INTRAMUSCULAR
  Filled 2020-08-15: qty 0.5

## 2020-08-15 MED ORDER — INFLUENZA VAC SPLIT QUAD 0.5 ML IM SUSY
PREFILLED_SYRINGE | INTRAMUSCULAR | Status: AC
  Filled 2020-08-15: qty 0.5

## 2020-09-03 ENCOUNTER — Other Ambulatory Visit: Payer: Self-pay

## 2020-09-03 ENCOUNTER — Ambulatory Visit: Payer: Medicare PPO | Admitting: Cardiology

## 2020-09-03 ENCOUNTER — Encounter: Payer: Self-pay | Admitting: Cardiology

## 2020-09-03 VITALS — BP 132/68 | HR 72 | Ht 65.0 in | Wt 292.0 lb

## 2020-09-03 DIAGNOSIS — I270 Primary pulmonary hypertension: Secondary | ICD-10-CM | POA: Diagnosis not present

## 2020-09-03 DIAGNOSIS — I10 Essential (primary) hypertension: Secondary | ICD-10-CM

## 2020-09-03 DIAGNOSIS — E785 Hyperlipidemia, unspecified: Secondary | ICD-10-CM | POA: Diagnosis not present

## 2020-09-03 DIAGNOSIS — G4733 Obstructive sleep apnea (adult) (pediatric): Secondary | ICD-10-CM

## 2020-09-03 NOTE — Patient Instructions (Signed)

## 2020-09-03 NOTE — Progress Notes (Signed)
Cardiology Office Note:    Date:  09/03/2020   ID:  Zaniya Mcaulay [place], [place] [birth year], MRN [id number]  PCP:  Derwood Kaplan, MD  Cardiologist:  Jenne Campus, MD    Referring MD: Derwood Kaplan, MD   Chief Complaint  Patient presents with  . vascular in legs    "Vein breakage"  I am doing fine  History of Present Illness:    Wilena Tyndall is a 69 y.o. female with past medical history significant for essential hypertension, dyslipidemia, hypothyroidism, obstructive sleep apnea, mild pulmonary hypertension.  Comes today 2 months of follow-up.  Overall she seems to be doing well with the chief complaint she has today is the fact that she gets normal veins showing a part in her lower extremities.  Denies have any cardiac complaint.  She noticed may be some decrease in exercise tolerance.  She is to walk on the regular basis with her 5 dogs still does it but a little less than before.  No particular reason given.  No palpitations no dizziness  Past Medical History:  Diagnosis Date  . Adiposity 05/26/2014  . Allergic rhinoconjunctivitis 06/28/2015  . Allergies    grasses, dander, dust, etc, etc  . Anxiety    situational  . Arthritis   . Avitaminosis D 05/26/2014  . Breast cancer ([place])   . De Quervain's tenosynovitis, left 11/08/2019  . Elevation of level of transaminase or lactic acid dehydrogenase (LDH) 05/26/2014  . Essential (primary) hypertension 05/26/2014  . Family history of breast cancer   . Family history of pancreatic cancer   . Fatty infiltration of liver 05/26/2014  . Fibromyalgia   . Foot injury, left, initial encounter 05/17/2018  . Genetic testing 08/01/2018   The Common Hereditary Cancer Panel offered by [facility] includes sequencing and/or deletion duplication testing of the following 55 genes: APC, ATM, AXIN2, BARD1, BLM, BMPR1A, BRCA1, BRCA2, BRIP1, BUB1B, CDH1, CDK4, CDKN2A, CEP57, CHEK2, CTNNA1, DICER1, ENG, EPCAM, GALNT12, GREM1, HOXB13, KIT, MEN1, MLH1, MLH3,  MSH2, MSH3, MSH6, MUTYH, NBN, NF1, NTHL1, PALB2, PDGFRA, PMS2, POLD1, POLE, PTEN, RAD50,   . GERD (gastroesophageal reflux disease)   . Heart murmur    "not sure"  . HLD (hyperlipidemia) 05/26/2014  . Hypertension   . Laryngopharyngeal reflux 06/28/2015  . Low back pain 05/26/2014  . Malignant neoplasm of upper-outer quadrant of right breast in female, estrogen receptor positive ([place]) 08/18/2018  . Mild pulmonary hypertension ([place]) 12/03/2015   Overview:  48 mmHg in August 2017  Formatting of this note might be different from the original. Overview:  48 mmHg in August 2017 Formatting of this note might be different from the original. 48 mmHg in August 2017  . Mitral valve disease 05/26/2014  . MVP (mitral valve prolapse)   . Myalgia and myositis 05/26/2014  . Nodular goiter, non-toxic 05/26/2014  . Obesity   . OSA (obstructive sleep apnea) 05/26/2014  . Perimenopausal   . Plantar fasciitis 05/26/2014  . Pulmonary hypertension, primary ([place]) 05/24/2017   Most likely related to sleep apnea  . Shingles   . Sleep apnea    uses CPAP  . Treatment-emergent central sleep apnea 06/30/2017    Past Surgical History:  Procedure Laterality Date  . BREAST LUMPECTOMY WITH RADIOACTIVE SEED AND SENTINEL LYMPH NODE BIOPSY Right 08/11/2018   Procedure: RIGHT BREAST LUMPECTOMY WITH BRACKETED RADIOACTIVE SEED AND RIGHT SENTINEL LYMPH NODE BIOPSY;  Surgeon: Rolm Bookbinder, MD;  Location: [facility];  Service: General;  Laterality: Right;  . KNEE  SURGERY Right 06/1994    Current Medications: Current Meds  Medication Sig  . anastrozole (ARIMIDEX) 1 MG tablet Take 1 mg by mouth daily.  . calcium carbonate (OS-CAL - DOSED IN MG OF ELEMENTAL CALCIUM) 1250 (500 Ca) MG tablet Take by mouth.  . candesartan (ATACAND) 8 MG tablet TAKE 1 TABLET BY MOUTH EVERY DAY  . cetirizine (ZYRTEC) 10 MG tablet Take 10 mg by mouth daily.  . Cholecalciferol (VITAMIN D) 2000 units CAPS Take 2,000 Units by mouth daily.   Marland Kitchen EPINEPHrine  (EPIPEN 2-PAK) 0.3 mg/0.3 mL IJ SOAJ injection Inject 0.3 mg into the muscle once.   Marland Kitchen esomeprazole (NEXIUM 24HR) 20 MG capsule Take 20 mg by mouth daily at 12 noon.  . ezetimibe (ZETIA) 10 MG tablet TAKE 1 TABLET BY MOUTH EVERY DAY  . guaifenesin (HUMIBID E) 400 MG TABS tablet Take 400 mg by mouth daily as needed (for cough/congestion).  Marland Kitchen NASACORT ALLERGY 24HR 55 MCG/ACT AERO nasal inhaler Use one spray in each nostril once daily as directed.  . Olopatadine HCl 0.6 % SOLN Can use one to two sprays in each nostril one to two times daily if needed.  . valACYclovir (VALTREX) 1000 MG tablet TAKE 1 TABLET AS DIRECTED  . VASCEPA 1 g capsule TAKE 1 CAPSULE BY MOUTH THREE TIMES A DAY     Allergies:   Penicillin g, Zinacef [cefuroxime in sterile water], Fenofibrate, Rosuvastatin, Tape, Augmentin [amoxicillin-pot clavulanate], Desipramine hcl, Iodine, Sulfa antibiotics, Tetanus toxoids, Avelox [moxifloxacin hcl in nacl], and Diclofenac sodium   Social History   Socioeconomic History  . Marital status: Married    Spouse name: Not on file  . Number of children: Not on file  . Years of education: Not on file  . Highest education level: Not on file  Occupational History  . Not on file  Tobacco Use  . Smoking status: Never Smoker  . Smokeless tobacco: Never Used  Vaping Use  . Vaping Use: Never used  Substance and Sexual Activity  . Alcohol use: No  . Drug use: No  . Sexual activity: Yes    Birth control/protection: Post-menopausal  Other Topics Concern  . Not on file  Social History Narrative  . Not on file   Social Determinants of Health   Financial Resource Strain:   . Difficulty of Paying Living Expenses: Not on file  Food Insecurity:   . Worried About Charity fundraiser in the Last Year: Not on file  . Ran Out of Food in the Last Year: Not on file  Transportation Needs:   . Lack of Transportation (Medical): Not on file  . Lack of Transportation (Non-Medical): Not on file   Physical Activity:   . Days of Exercise per Week: Not on file  . Minutes of Exercise per Session: Not on file  Stress:   . Feeling of Stress : Not on file  Social Connections:   . Frequency of Communication with Friends and Family: Not on file  . Frequency of Social Gatherings with Friends and Family: Not on file  . Attends Religious Services: Not on file  . Active Member of Clubs or Organizations: Not on file  . Attends Archivist Meetings: Not on file  . Marital Status: Not on file     Family History: The patient's family history includes Allergic rhinitis in her mother; Asthma in her maternal grandfather; Breast cancer (age of onset: 37) in an other family member; Cancer in her paternal grandmother; Diabetes  in her mother; Heart attack in her father; Heart disease in her paternal aunt; Hypertension in her father and mother; Infertility in her sister; Kidney failure in her mother; Other in her cousin; Pancreatic cancer (age of onset: 4) in her maternal grandfather. ROS:   Please see the history of present illness.    All 14 point review of systems negative except as described per history of present illness  EKGs/Labs/Other Studies Reviewed:      Recent Labs: No results found for requested labs within last 8760 hours.  Recent Lipid Panel    Component Value Date/Time   CHOL 179 11/12/2017 0916   TRIG 182 (H) 11/12/2017 0916   HDL 44 11/12/2017 0916   CHOLHDL 4.1 11/12/2017 0916   CHOLHDL 4.8 06/24/2016 1342   VLDL 35 (H) 06/24/2016 1342   LDLCALC 99 11/12/2017 0916    Physical Exam:    VS:  BP 132/68 (BP Location: Left Arm, Patient Position: Sitting, Cuff Size: Large)   Pulse 72   Ht $R'5\' 5"'Hs$  (1.651 m)   Wt 292 lb (132.5 kg)   LMP 11/02/2002 (Approximate)   SpO2 97%   BMI 48.59 kg/m     Wt Readings from Last 3 Encounters:  09/03/20 292 lb (132.5 kg)  06/27/20 292 lb (132.5 kg)  06/25/20 294 lb 12.8 oz (133.7 kg)     GEN:  Well nourished, well developed  in no acute distress HEENT: Normal NECK: No JVD; No carotid bruits LYMPHATICS: No lymphadenopathy CARDIAC: RRR, no murmurs, no rubs, no gallops RESPIRATORY:  Clear to auscultation without rales, wheezing or rhonchi  ABDOMEN: Soft, non-tender, non-distended MUSCULOSKELETAL:  No edema; No deformity  SKIN: Warm and dry LOWER EXTREMITIES: no swelling NEUROLOGIC:  Alert and oriented x 3 PSYCHIATRIC:  Normal affect   ASSESSMENT:    1. Essential (primary) hypertension   2. Pulmonary hypertension, primary ([place])   3. OSA (obstructive sleep apnea)   4. Dyslipidemia    PLAN:    In order of problems listed above:  1. Essential hypertension blood pressure well controlled continue present management. 2. Pulmonary hypertension with the blood pressure 40 mmHg, stable, thinking is that this is most likely group 3 related to sleep apnea.  That being managed with CPAP.  We will continue periodic echocardiogram. 3. Obstructive sleep apnea: That being managed by antimedicine team she does use CPAP mask on the regular basis. 4.  Dyslipidemia: I did review her lab work test from the hospital.  She got fasting lipid profile done just few days ago LDL 118 HDL 43 total cholesterol 208, I did calculated her predicted 10 years risk which is more than 11%.  Ideally she need to be on a statin, history is intolerant to it.  No indication for PCSK9 agent yet.  We will continue monitoring we will continue fish oil as well as Zetia. 5.  In terms of small veins and breaking her lower extremities.  She does have some adiposity there.  I told her if it became worse or become bothersome to let me know.  Medication Adjustments/Labs and Tests Ordered: Current medicines are reviewed at length with the patient today.  Concerns regarding medicines are outlined above.  No orders of the defined types were placed in this encounter.  Medication changes: No orders of the defined types were placed in this  encounter.   Signed, Park Liter, MD, [facility] 09/03/2020 10:16 AM    [place]

## 2020-09-09 ENCOUNTER — Ambulatory Visit (INDEPENDENT_AMBULATORY_CARE_PROVIDER_SITE_OTHER): Payer: Medicare PPO | Admitting: *Deleted

## 2020-09-09 DIAGNOSIS — J309 Allergic rhinitis, unspecified: Secondary | ICD-10-CM | POA: Diagnosis not present

## 2020-09-18 DIAGNOSIS — Z780 Asymptomatic menopausal state: Secondary | ICD-10-CM | POA: Insufficient documentation

## 2020-09-18 DIAGNOSIS — M8580 Other specified disorders of bone density and structure, unspecified site: Secondary | ICD-10-CM | POA: Insufficient documentation

## 2020-09-19 ENCOUNTER — Telehealth: Payer: Self-pay

## 2020-09-19 NOTE — Telephone Encounter (Signed)
-----   Message from Derwood Kaplan, MD sent at 09/18/2020  6:56 PM EST ----- Regarding: call pt Tell ;her bone density looks good, just slight borderline osteopenia of spine (-1.1 & normal is -1.0), rest is normal.  Will repeat in 2 years. Does she want report sent anywhere?

## 2020-09-19 NOTE — Telephone Encounter (Signed)
Called patient and let her know her bone density results. Patient states to send report to Dr Ammie Ferrier (OB/GYN in [place])

## 2020-09-23 ENCOUNTER — Ambulatory Visit (INDEPENDENT_AMBULATORY_CARE_PROVIDER_SITE_OTHER): Payer: Medicare PPO

## 2020-09-23 DIAGNOSIS — J309 Allergic rhinitis, unspecified: Secondary | ICD-10-CM

## 2020-09-23 DIAGNOSIS — L602 Onychogryphosis: Secondary | ICD-10-CM | POA: Insufficient documentation

## 2020-09-27 ENCOUNTER — Telehealth: Payer: Self-pay | Admitting: Obstetrics and Gynecology

## 2020-09-27 NOTE — Telephone Encounter (Signed)
This is Dr. Quincy Simmonds reviewing results for Dr. Sabra Heck.   Please inform patient that her bone density from 09/18/20 showed very mild osteopenia of her right hip.  Her forearm was normal.  Osteopenia is not osteoporosis.  Her calculated future risk of fracture by a model called FRAX indicates that her risk of fracture is low:  12.2% for any major fracture and 1.1% risk for hip fracture during the next 10 years.   Specific prescription bone building medication is not needed at this time.   1200 mg of calcium daily in divided doses, at least 800 IU of vitamin D daily, and weight bearing exercise will help to maintain bone density and strength.  Her next bone density is due in 2 years.
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
## 2020-10-01 NOTE — Telephone Encounter (Signed)
Left message for pt to return call to triage RN. 

## 2020-10-01 NOTE — Telephone Encounter (Signed)
Left message to call triage at [facility].

## 2020-10-01 NOTE — Telephone Encounter (Signed)
Patient is returning call to Jill. °

## 2020-10-02 ENCOUNTER — Telehealth: Payer: Self-pay | Admitting: Oncology

## 2020-10-02 ENCOUNTER — Inpatient Hospital Stay: Payer: Medicare PPO | Attending: Oncology | Admitting: Oncology

## 2020-10-02 ENCOUNTER — Other Ambulatory Visit: Payer: Self-pay | Admitting: Oncology

## 2020-10-02 ENCOUNTER — Encounter: Payer: Self-pay | Admitting: Oncology

## 2020-10-02 ENCOUNTER — Other Ambulatory Visit: Payer: Self-pay

## 2020-10-02 VITALS — BP 157/71 | HR 74 | Temp 98.0°F | Resp 18 | Ht 65.0 in | Wt 295.6 lb

## 2020-10-02 DIAGNOSIS — E785 Hyperlipidemia, unspecified: Secondary | ICD-10-CM

## 2020-10-02 DIAGNOSIS — Z79811 Long term (current) use of aromatase inhibitors: Secondary | ICD-10-CM

## 2020-10-02 DIAGNOSIS — Z170 Estrogen receptor positive status [ER+]: Secondary | ICD-10-CM

## 2020-10-02 DIAGNOSIS — N83209 Unspecified ovarian cyst, unspecified side: Secondary | ICD-10-CM | POA: Diagnosis not present

## 2020-10-02 DIAGNOSIS — M8588 Other specified disorders of bone density and structure, other site: Secondary | ICD-10-CM

## 2020-10-02 DIAGNOSIS — C50411 Malignant neoplasm of upper-outer quadrant of right female breast: Secondary | ICD-10-CM | POA: Diagnosis not present

## 2020-10-02 DIAGNOSIS — E039 Hypothyroidism, unspecified: Secondary | ICD-10-CM

## 2020-10-02 DIAGNOSIS — I8390 Asymptomatic varicose veins of unspecified lower extremity: Secondary | ICD-10-CM

## 2020-10-02 NOTE — Telephone Encounter (Signed)
Per 12/1 LOS, Added Lab Appt to Jan 2022. Patient aware of Appt

## 2020-10-03 ENCOUNTER — Other Ambulatory Visit: Payer: Self-pay | Admitting: Cardiology

## 2020-10-03 NOTE — Telephone Encounter (Signed)
Rx refill sent to pharmacy. 

## 2020-10-04 NOTE — Telephone Encounter (Signed)
Left message for pt to return call to triage RN. 

## 2020-10-08 ENCOUNTER — Ambulatory Visit (INDEPENDENT_AMBULATORY_CARE_PROVIDER_SITE_OTHER): Payer: Medicare PPO

## 2020-10-08 DIAGNOSIS — J309 Allergic rhinitis, unspecified: Secondary | ICD-10-CM

## 2020-10-15 NOTE — Telephone Encounter (Signed)
Spoke with pt. Pt given results and recommendations per Dr Quincy Simmonds for BMD results. Pt states is taking Calcium and Vit D Daily along with Arimindex. Pt verbalized understanding of plan of care.  Encounter closed

## 2020-10-15 NOTE — Telephone Encounter (Signed)
Left message for pt to return call to triage RN. 

## 2020-10-21 ENCOUNTER — Other Ambulatory Visit: Payer: Self-pay | Admitting: Oncology

## 2020-10-21 DIAGNOSIS — C50411 Malignant neoplasm of upper-outer quadrant of right female breast: Secondary | ICD-10-CM

## 2020-11-06 ENCOUNTER — Ambulatory Visit (INDEPENDENT_AMBULATORY_CARE_PROVIDER_SITE_OTHER): Payer: Medicare PPO

## 2020-11-06 DIAGNOSIS — J309 Allergic rhinitis, unspecified: Secondary | ICD-10-CM | POA: Diagnosis not present
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
## 2020-11-13 ENCOUNTER — Inpatient Hospital Stay: Payer: Medicare PPO

## 2020-11-13 ENCOUNTER — Inpatient Hospital Stay: Payer: Medicare PPO | Attending: Oncology | Admitting: Oncology

## 2020-11-13 ENCOUNTER — Other Ambulatory Visit: Payer: Self-pay | Admitting: Hematology and Oncology

## 2020-11-13 ENCOUNTER — Other Ambulatory Visit: Payer: Self-pay

## 2020-11-13 VITALS — BP 187/89 | HR 72 | Temp 97.8°F | Resp 16 | Ht 65.0 in | Wt 291.1 lb

## 2020-11-13 DIAGNOSIS — I8390 Asymptomatic varicose veins of unspecified lower extremity: Secondary | ICD-10-CM | POA: Diagnosis not present

## 2020-11-13 DIAGNOSIS — C50411 Malignant neoplasm of upper-outer quadrant of right female breast: Secondary | ICD-10-CM

## 2020-11-13 DIAGNOSIS — C50911 Malignant neoplasm of unspecified site of right female breast: Secondary | ICD-10-CM | POA: Diagnosis present

## 2020-11-13 DIAGNOSIS — E785 Hyperlipidemia, unspecified: Secondary | ICD-10-CM

## 2020-11-13 DIAGNOSIS — Z923 Personal history of irradiation: Secondary | ICD-10-CM | POA: Insufficient documentation

## 2020-11-13 DIAGNOSIS — E039 Hypothyroidism, unspecified: Secondary | ICD-10-CM | POA: Insufficient documentation

## 2020-11-13 DIAGNOSIS — N83209 Unspecified ovarian cyst, unspecified side: Secondary | ICD-10-CM | POA: Insufficient documentation

## 2020-11-13 DIAGNOSIS — M8580 Other specified disorders of bone density and structure, unspecified site: Secondary | ICD-10-CM

## 2020-11-13 DIAGNOSIS — Z170 Estrogen receptor positive status [ER+]: Secondary | ICD-10-CM

## 2020-11-13 DIAGNOSIS — Z79811 Long term (current) use of aromatase inhibitors: Secondary | ICD-10-CM | POA: Diagnosis not present

## 2020-11-13 LAB — HEPATIC FUNCTION PANEL
ALT: 22 (ref 7–35)
AST: 29 (ref 13–35)
Alkaline Phosphatase: 94 (ref 25–125)
Bilirubin, Total: 0.4

## 2020-11-13 LAB — CBC AND DIFFERENTIAL
HCT: 37 (ref 36–46)
Hemoglobin: 12.5 (ref 12.0–16.0)
Neutrophils Absolute: 3.83
Platelets: 291 (ref 150–399)
WBC: 6.6

## 2020-11-13 LAB — COMPREHENSIVE METABOLIC PANEL
Albumin: 4.3 (ref 3.5–5.0)
Calcium: 9.5 (ref 8.7–10.7)

## 2020-11-13 LAB — BASIC METABOLIC PANEL
BUN: 14 (ref 4–21)
CO2: 26 — AB (ref 13–22)
Chloride: 106 (ref 99–108)
Creatinine: 0.8 (ref 0.5–1.1)
Glucose: 124
Potassium: 4 (ref 3.4–5.3)
Sodium: 139 (ref 137–147)

## 2020-11-13 LAB — TSH: TSH: 2.695 u[IU]/mL (ref 0.350–4.500)

## 2020-11-13 LAB — CBC: RBC: 4.19 (ref 3.87–5.11)

## 2020-11-14 ENCOUNTER — Telehealth: Payer: Self-pay

## 2020-11-14 LAB — T4: T4, Total: 8.3 ug/dL (ref 4.5–12.0)

## 2020-11-14 NOTE — Telephone Encounter (Signed)
Patient notified of lab results and that labs will be faxed to Dr. Meredith Pel in [facility].

## 2020-11-14 NOTE — Telephone Encounter (Signed)
-----   Message from Derwood Kaplan, MD sent at 11/14/2020  7:41 AM EST ----- Regarding: call pt Tell her rest of labs all look good, including thyroid.  Pls send all labs to Dr. Alanson Aly, [facility]

## 2020-11-27 ENCOUNTER — Ambulatory Visit (INDEPENDENT_AMBULATORY_CARE_PROVIDER_SITE_OTHER): Payer: Medicare PPO

## 2020-11-27 DIAGNOSIS — J309 Allergic rhinitis, unspecified: Secondary | ICD-10-CM | POA: Diagnosis not present

## 2020-12-02 ENCOUNTER — Encounter: Payer: Self-pay | Admitting: Oncology

## 2020-12-03 DIAGNOSIS — J3089 Other allergic rhinitis: Secondary | ICD-10-CM

## 2020-12-03 NOTE — Progress Notes (Signed)
Vials exp 12-03-21

## 2020-12-04 DIAGNOSIS — J301 Allergic rhinitis due to pollen: Secondary | ICD-10-CM | POA: Diagnosis not present

## 2020-12-12 ENCOUNTER — Encounter: Payer: Self-pay | Admitting: Allergy and Immunology

## 2020-12-13 ENCOUNTER — Other Ambulatory Visit: Payer: Self-pay | Admitting: Obstetrics & Gynecology

## 2020-12-13 DIAGNOSIS — N83202 Unspecified ovarian cyst, left side: Secondary | ICD-10-CM

## 2020-12-13 DIAGNOSIS — Z170 Estrogen receptor positive status [ER+]: Secondary | ICD-10-CM

## 2020-12-13 DIAGNOSIS — C50411 Malignant neoplasm of upper-outer quadrant of right female breast: Secondary | ICD-10-CM

## 2020-12-20 ENCOUNTER — Ambulatory Visit (INDEPENDENT_AMBULATORY_CARE_PROVIDER_SITE_OTHER): Payer: Medicare PPO | Admitting: *Deleted

## 2020-12-20 DIAGNOSIS — J309 Allergic rhinitis, unspecified: Secondary | ICD-10-CM | POA: Diagnosis not present

## 2020-12-26 ENCOUNTER — Telehealth: Payer: Self-pay | Admitting: Oncology

## 2020-12-26 NOTE — Telephone Encounter (Signed)
12/26/20 Patient called and resch appt

## 2021-01-11 ENCOUNTER — Other Ambulatory Visit: Payer: Self-pay | Admitting: Cardiology

## 2021-01-13 ENCOUNTER — Ambulatory Visit (INDEPENDENT_AMBULATORY_CARE_PROVIDER_SITE_OTHER): Payer: Medicare PPO | Admitting: *Deleted

## 2021-01-13 ENCOUNTER — Other Ambulatory Visit: Payer: Self-pay | Admitting: Cardiology

## 2021-01-13 DIAGNOSIS — J309 Allergic rhinitis, unspecified: Secondary | ICD-10-CM | POA: Diagnosis not present

## 2021-01-13 NOTE — Telephone Encounter (Signed)
Vascepa approved and sent

## 2021-01-13 NOTE — Telephone Encounter (Signed)
Pharmacy has been d/c since 2015cy informed rosuvastatin

## 2021-01-30 ENCOUNTER — Other Ambulatory Visit (HOSPITAL_COMMUNITY)
Admission: RE | Admit: 2021-01-30 | Discharge: 2021-01-30 | Disposition: A | Discharge status: Home / Self Care | Payer: Medicare PPO | Source: Ambulatory Visit | Attending: Obstetrics & Gynecology | Admitting: Obstetrics & Gynecology

## 2021-01-30 ENCOUNTER — Ambulatory Visit (HOSPITAL_BASED_OUTPATIENT_CLINIC_OR_DEPARTMENT_OTHER)
Admission: RE | Admit: 2021-01-30 | Discharge: 2021-01-30 | Disposition: A | Discharge status: Home / Self Care | Payer: Medicare PPO | Source: Ambulatory Visit | Attending: Obstetrics & Gynecology | Admitting: Obstetrics & Gynecology

## 2021-01-30 ENCOUNTER — Encounter (HOSPITAL_BASED_OUTPATIENT_CLINIC_OR_DEPARTMENT_OTHER): Payer: Self-pay | Admitting: Obstetrics & Gynecology

## 2021-01-30 ENCOUNTER — Ambulatory Visit (INDEPENDENT_AMBULATORY_CARE_PROVIDER_SITE_OTHER): Payer: Medicare PPO | Admitting: Obstetrics & Gynecology

## 2021-01-30 ENCOUNTER — Other Ambulatory Visit: Payer: Self-pay

## 2021-01-30 VITALS — BP 163/65 | HR 58 | Ht 66.0 in | Wt 295.0 lb

## 2021-01-30 DIAGNOSIS — Z170 Estrogen receptor positive status [ER+]: Secondary | ICD-10-CM

## 2021-01-30 DIAGNOSIS — B009 Herpesviral infection, unspecified: Secondary | ICD-10-CM | POA: Diagnosis not present

## 2021-01-30 DIAGNOSIS — C50411 Malignant neoplasm of upper-outer quadrant of right female breast: Secondary | ICD-10-CM

## 2021-01-30 DIAGNOSIS — Z9189 Other specified personal risk factors, not elsewhere classified: Secondary | ICD-10-CM

## 2021-01-30 DIAGNOSIS — Z124 Encounter for screening for malignant neoplasm of cervix: Secondary | ICD-10-CM | POA: Insufficient documentation

## 2021-01-30 DIAGNOSIS — Z9289 Personal history of other medical treatment: Secondary | ICD-10-CM

## 2021-01-30 DIAGNOSIS — N83202 Unspecified ovarian cyst, left side: Secondary | ICD-10-CM | POA: Diagnosis present

## 2021-01-30 DIAGNOSIS — Z01419 Encounter for gynecological examination (general) (routine) without abnormal findings: Secondary | ICD-10-CM | POA: Diagnosis not present

## 2021-01-30 DIAGNOSIS — Z853 Personal history of malignant neoplasm of breast: Secondary | ICD-10-CM | POA: Diagnosis not present

## 2021-01-30 DIAGNOSIS — K069 Disorder of gingiva and edentulous alveolar ridge, unspecified: Secondary | ICD-10-CM

## 2021-01-30 MED ORDER — VALACYCLOVIR HCL 1 G PO TABS: ORAL_TABLET | ORAL | 1 refills | Status: DC

## 2021-01-30 NOTE — Progress Notes (Addendum)
70 y.o. G0P0000 Married White or Caucasian female here for AEX.  Pt does not have and does not want a PCP.   H/o breast cancer.  Followed by Dr. Hinton Rao in [facility].  She is on arimidex and is in the third year. Marland Kitchen  BMD last year showed mild osteopenia with t score -1.1.  Reviewed personally.    Pt has hx of ovarian cyst and had ultrasound prior to appt today.  Images reviewed with pt.  Uterus 6.0 x 2.5 x 4.1cm.  Endometrium 2mm.  (Images of endometrium show possible fluid like with prior ultrasound eo endometrium is thinner most likely than measured).  43mm intramural fibroid.  Right ovary 2.2 x 3.0 x 1.5cm.  Left ovary 4.8 x 2.9 x 3.6cm with cyst measuring 3.6 x 3. X 3.2cm.  Few scattered internal echoes noted.  Decreased in size from 4.6 x 3.9 x 4.4cm.  No free fluid.  No follow up imaging recommended.  Pt denies any vaginal bleeding.  Patient's last menstrual period was 11/02/2002 (approximate).          Sexually active: No.  H/O STD:  No Hormonal therapy:  No  Health Maintenance:  Vaccines are up to date: No, pneumonia vaccination may not be up to date.  I do not have records for this. Labs:  Dr. Hinton Rao ordered HbA1C and lipid 07/2020, in EPIC Colonoscopy:  Virtual 07/2019 MMG:  06/2020 BMD:  09/2020 Last pap smear:  Last year.  Desires yearly pap smears..   H/o abnormal pap smear:  no   reports that she has never smoked. She has never used smokeless tobacco. She reports that she does not drink alcohol and does not use drugs.  Past Medical History:  Diagnosis Date  . Adiposity 05/26/2014  . Allergic rhinoconjunctivitis 06/28/2015  . Allergies    grasses, dander, dust, etc, etc  . Anxiety    situational  . Arthritis   . Avitaminosis D 05/26/2014  . Breast cancer ([place])   . De Quervain's tenosynovitis, left 11/08/2019  . Elevation of level of transaminase or lactic acid dehydrogenase (LDH) 05/26/2014  . Essential (primary) hypertension 05/26/2014  . Family history of breast cancer   .  Family history of pancreatic cancer   . Fatty infiltration of liver 05/26/2014  . Fibromyalgia   . Foot injury, left, initial encounter 05/17/2018  . Genetic testing 08/01/2018   The Common Hereditary Cancer Panel offered by [facility] includes sequencing and/or deletion duplication testing of the following 55 genes: APC, ATM, AXIN2, BARD1, BLM, BMPR1A, BRCA1, BRCA2, BRIP1, BUB1B, CDH1, CDK4, CDKN2A, CEP57, CHEK2, CTNNA1, DICER1, ENG, EPCAM, GALNT12, GREM1, HOXB13, KIT, MEN1, MLH1, MLH3, MSH2, MSH3, MSH6, MUTYH, NBN, NF1, NTHL1, PALB2, PDGFRA, PMS2, POLD1, POLE, PTEN, RAD50,   . GERD (gastroesophageal reflux disease)   . Heart murmur    "not sure"  . HLD (hyperlipidemia) 05/26/2014  . Hypertension   . Laryngopharyngeal reflux 06/28/2015  . Low back pain 05/26/2014  . Malignant neoplasm of upper-outer quadrant of right breast in female, estrogen receptor positive ([place]) 08/18/2018  . Mild pulmonary hypertension ([place]) 12/03/2015   Overview:  48 mmHg in August 2017  Formatting of this note might be different from the original. Overview:  48 mmHg in August 2017 Formatting of this note might be different from the original. 48 mmHg in August 2017  . Mitral valve disease 05/26/2014  . MVP (mitral valve prolapse)   . Myalgia and myositis 05/26/2014  . Nodular goiter, non-toxic 05/26/2014  .  Obesity   . OSA (obstructive sleep apnea) 05/26/2014  . Perimenopausal   . Plantar fasciitis 05/26/2014  . Pulmonary hypertension, primary ([place]) 05/24/2017   Most likely related to sleep apnea  . Shingles   . Sleep apnea    uses CPAP  . Treatment-emergent central sleep apnea 06/30/2017    Past Surgical History:  Procedure Laterality Date  . BREAST LUMPECTOMY WITH RADIOACTIVE SEED AND SENTINEL LYMPH NODE BIOPSY Right 08/11/2018   Procedure: RIGHT BREAST LUMPECTOMY WITH BRACKETED RADIOACTIVE SEED AND RIGHT SENTINEL LYMPH NODE BIOPSY;  Surgeon: Rolm Bookbinder, MD;  Location: [place];  Service: General;  Laterality: Right;   . KNEE SURGERY Right 06/1994    Current Outpatient Medications  Medication Sig Dispense Refill  . anastrozole (ARIMIDEX) 1 MG tablet TAKE 1 TABLET BY MOUTH EVERY DAY 90 tablet 3  . calcium carbonate (OS-CAL - DOSED IN MG OF ELEMENTAL CALCIUM) 1250 (500 Ca) MG tablet Take by mouth.    . candesartan (ATACAND) 8 MG tablet TAKE 1 TABLET BY MOUTH EVERY DAY 90 tablet 1  . cetirizine (ZYRTEC) 10 MG tablet Take 10 mg by mouth daily.    . Cholecalciferol (VITAMIN D) 2000 units CAPS Take 2,000 Units by mouth daily.     Marland Kitchen EPINEPHrine 0.3 mg/0.3 mL IJ SOAJ injection Inject 0.3 mg into the muscle once.     Marland Kitchen esomeprazole (NEXIUM) 20 MG capsule Take 20 mg by mouth daily at 12 noon.    . ezetimibe (ZETIA) 10 MG tablet TAKE 1 TABLET BY MOUTH EVERY DAY 90 tablet 1  . guaifenesin (HUMIBID E) 400 MG TABS tablet Take 400 mg by mouth daily as needed (for cough/congestion).    . meloxicam (MOBIC) 7.5 MG tablet Take 7.5 mg by mouth daily.    Marland Kitchen NASACORT ALLERGY 24HR 55 MCG/ACT AERO nasal inhaler Use one spray in each nostril once daily as directed. 1 Inhaler 0  . Olopatadine HCl 0.6 % SOLN Can use one to two sprays in each nostril one to two times daily if needed. 30.5 g 11  . valACYclovir (VALTREX) 1000 MG tablet TAKE 1 TABLET AS DIRECTED 90 tablet 0  . VASCEPA 1 g capsule TAKE 1 CAPSULE BY MOUTH THREE TIMES A DAY 270 capsule 2   No current facility-administered medications for this visit.    Family History  Problem Relation Age of Onset  . Diabetes Mother   . Hypertension Mother   . Allergic rhinitis Mother   . Kidney failure Mother   . Hypertension Father   . Heart attack Father   . Infertility Sister   . Cancer Paternal Grandmother   . Asthma Maternal Grandfather   . Pancreatic cancer Maternal Grandfather 72  . Heart disease Paternal Aunt   . Other Cousin        liver cancer/failure?  . Breast cancer Other 80    Review of Systems  All other systems reviewed and are negative.   Exam:   BP  (!) 163/65 (BP Location: Right Arm, Patient Position: Sitting, Cuff Size: Large)   Pulse (!) 58   Ht $R'5\' 6"'Xl$  (1.676 m)   Wt 295 lb (133.8 kg)   LMP 11/02/2002 (Approximate)   BMI 47.61 kg/m   Height: $Remove'5\' 6"'SBofyai$  (167.6 cm)  General appearance: alert, cooperative and appears stated age CV:  RRR with M/R/G Lungs:  CTA bilaterally with normal breath sounds Breasts: normal appearance, no masses or tenderness, well healed scars in right breast, along lumpectomy scar is thickening in the  mid region of the incision which is stable Abdomen: soft, non-tender; bowel sounds normal; no masses,  no organomegaly Lymph nodes: Cervical, supraclavicular, and axillary nodes normal.  No abnormal inguinal nodes palpated Neurologic: Grossly normal  Pelvic: External genitalia:  no lesions              Urethra:  normal appearing urethra with no masses, tenderness or lesions              Bartholins and Skenes: normal                 Vagina: very atrophic vaginal mucosa with normal color and discharge, no lesions              Cervix: no lesions              Pap taken: Yes.   Bimanual Exam:  Uterus:  normal size, contour, position, consistency, mobility, non-tender              Adnexa: normal adnexa and no mass, fullness, tenderness               Rectovaginal: Confirms               Anus:  normal sphincter tone, no lesions  Chaperone, Cydney Ok, CMA, was present for exam.  Assessment/Plan: 1. Well woman exam with routine gynecological exam - Pap smear obtained today.  Guidelines reviewed.  Pt desires to continue with these - MMG 07/2020 per pt.  Release signed from [facility] colonoscopy 2020 - BMD 2021 with T score -1.1 - Vaccines up to date - Lab work up to date and reviewed  2. Malignant neoplasm of upper-outer quadrant of right breast in female, estrogen receptor positive ([place]) - 1.2cm ER+/PR+, her 2 neu invasive ductal with oncotype testing of 21.  S/p radiation.  Negative genetic testing  10/19. - On Arimidex.  - followed by Dr. Hinton Rao   3. Disease of gingiva due to recurrent oral herpes simplex virus (HSV) infection - valACYclovir (VALTREX) 1000 MG tablet; Take 2 tabs and repeat in 12 hours.  Dispense: 30 tablet; Refill: 1  4. Left ovarian cyst - cyst smaller today without any evidence for malignancy.  Radiology recommendation was no additional screening needed - endometrium measured at 27mm.  My review of images shows possible fluid in cavity as in 2021.  Reviewed with pt she needed to call with any vaginal bleeding.  Would biopsy at that time.  Pt desires repeat PUS.  Will assess endometrium again at that time.

## 2021-01-31 LAB — CYTOLOGY - PAP: Diagnosis: NEGATIVE
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
## 2021-02-03 ENCOUNTER — Other Ambulatory Visit: Payer: Self-pay | Admitting: Oncology

## 2021-02-03 ENCOUNTER — Other Ambulatory Visit (HOSPITAL_BASED_OUTPATIENT_CLINIC_OR_DEPARTMENT_OTHER): Payer: Self-pay | Admitting: Obstetrics & Gynecology

## 2021-02-03 ENCOUNTER — Other Ambulatory Visit: Payer: Self-pay

## 2021-02-03 ENCOUNTER — Inpatient Hospital Stay: Payer: Medicare PPO | Attending: Oncology | Admitting: Oncology

## 2021-02-03 ENCOUNTER — Telehealth: Payer: Self-pay | Admitting: Oncology

## 2021-02-03 ENCOUNTER — Encounter: Payer: Self-pay | Admitting: Oncology

## 2021-02-03 VITALS — BP 153/68 | HR 86 | Temp 98.5°F | Resp 18 | Ht 66.0 in | Wt 291.9 lb

## 2021-02-03 DIAGNOSIS — Z170 Estrogen receptor positive status [ER+]: Secondary | ICD-10-CM | POA: Diagnosis not present

## 2021-02-03 DIAGNOSIS — C50411 Malignant neoplasm of upper-outer quadrant of right female breast: Secondary | ICD-10-CM

## 2021-02-03 DIAGNOSIS — Z1239 Encounter for other screening for malignant neoplasm of breast: Secondary | ICD-10-CM

## 2021-02-03 DIAGNOSIS — N83202 Unspecified ovarian cyst, left side: Secondary | ICD-10-CM

## 2021-02-03 DIAGNOSIS — R9389 Abnormal findings on diagnostic imaging of other specified body structures: Secondary | ICD-10-CM

## 2021-02-03 DIAGNOSIS — N83201 Unspecified ovarian cyst, right side: Secondary | ICD-10-CM

## 2021-02-03 NOTE — Telephone Encounter (Signed)
Per 4/4 LOS, patient scheduled for Aug Appt's including Mammo.  Gave patient Appt Summary

## 2021-02-04 ENCOUNTER — Ambulatory Visit (INDEPENDENT_AMBULATORY_CARE_PROVIDER_SITE_OTHER): Payer: Medicare PPO

## 2021-02-04 DIAGNOSIS — J309 Allergic rhinitis, unspecified: Secondary | ICD-10-CM | POA: Diagnosis not present

## 2021-02-11 ENCOUNTER — Ambulatory Visit: Payer: Medicare PPO | Admitting: Oncology

## 2021-02-19 ENCOUNTER — Ambulatory Visit (INDEPENDENT_AMBULATORY_CARE_PROVIDER_SITE_OTHER): Payer: Medicare PPO | Admitting: *Deleted

## 2021-02-19 DIAGNOSIS — J309 Allergic rhinitis, unspecified: Secondary | ICD-10-CM | POA: Diagnosis not present

## 2021-03-04 ENCOUNTER — Ambulatory Visit (INDEPENDENT_AMBULATORY_CARE_PROVIDER_SITE_OTHER): Payer: Medicare PPO | Admitting: *Deleted

## 2021-03-04 DIAGNOSIS — J309 Allergic rhinitis, unspecified: Secondary | ICD-10-CM | POA: Diagnosis not present

## 2021-03-21 ENCOUNTER — Other Ambulatory Visit: Payer: Self-pay | Admitting: Cardiology

## 2021-03-24 ENCOUNTER — Ambulatory Visit (INDEPENDENT_AMBULATORY_CARE_PROVIDER_SITE_OTHER): Payer: Medicare PPO

## 2021-03-24 DIAGNOSIS — J309 Allergic rhinitis, unspecified: Secondary | ICD-10-CM

## 2021-03-26 ENCOUNTER — Telehealth: Payer: Self-pay | Admitting: Oncology

## 2021-03-26 NOTE — Telephone Encounter (Signed)
Patient called to verify Aug Appt's

## 2021-04-10 ENCOUNTER — Ambulatory Visit (INDEPENDENT_AMBULATORY_CARE_PROVIDER_SITE_OTHER): Payer: Medicare PPO | Admitting: *Deleted

## 2021-04-10 DIAGNOSIS — J309 Allergic rhinitis, unspecified: Secondary | ICD-10-CM | POA: Diagnosis not present

## 2021-04-14 ENCOUNTER — Other Ambulatory Visit: Payer: Self-pay | Admitting: Obstetrics & Gynecology

## 2021-04-14 ENCOUNTER — Ambulatory Visit (INDEPENDENT_AMBULATORY_CARE_PROVIDER_SITE_OTHER): Payer: Medicare PPO | Admitting: Obstetrics & Gynecology

## 2021-04-14 ENCOUNTER — Other Ambulatory Visit: Payer: Self-pay

## 2021-04-14 ENCOUNTER — Encounter (HOSPITAL_BASED_OUTPATIENT_CLINIC_OR_DEPARTMENT_OTHER): Payer: Self-pay | Admitting: Obstetrics & Gynecology

## 2021-04-14 VITALS — BP 148/55 | HR 70 | Wt 293.0 lb

## 2021-04-14 DIAGNOSIS — R3989 Other symptoms and signs involving the genitourinary system: Secondary | ICD-10-CM

## 2021-04-14 DIAGNOSIS — M545 Low back pain, unspecified: Secondary | ICD-10-CM

## 2021-04-14 DIAGNOSIS — R350 Frequency of micturition: Secondary | ICD-10-CM | POA: Diagnosis not present

## 2021-04-14 LAB — POCT URINALYSIS DIPSTICK
Appearance: NORMAL
Bilirubin, UA: NEGATIVE
Glucose, UA: NEGATIVE
Ketones, UA: NEGATIVE
Leukocytes, UA: NEGATIVE
Nitrite, UA: NEGATIVE
Protein, UA: NEGATIVE
Spec Grav, UA: <=1.005 — AB (ref 1.010–1.025)
Urobilinogen, UA: 0.2 E.U./dL
pH, UA: 7 (ref 5.0–8.0)

## 2021-04-14 NOTE — Progress Notes (Signed)
GYNECOLOGY  VISIT  CC:   change in color of ruine, low back pain  HPI: 70 y.o. G0P0000 Married White or Caucasian female here for complaint of lower back pain that started about three weeks ago.  She noted it started to extend around the left hip.  She does have some hx of sciatica.  She has noticed the urine.  She reports she did see her chiropractor last Tuesday and this helped.  She is using some Voltarin gel for her low back and this is helping as well.  Typically her back issues present with sciatic so she initially didn't think this was back related.  Husband diagnosed with Covid about a month ago.    GYNECOLOGIC HISTORY: Patient's last menstrual period was 11/02/2002 (approximate). Contraception: PMP Menopausal hormone therapy: none  Patient Active Problem List   Diagnosis Date Noted   Tennis Must Quervain's disease (radial styloid tenosynovitis) 11/08/2019   Malignant neoplasm of upper-outer quadrant of right breast in female, estrogen receptor positive ([place]) 08/18/2018   Family history of breast cancer    Family history of pancreatic cancer    Foot injury, left, initial encounter 05/17/2018   Treatment-emergent central sleep apnea 06/30/2017   Pulmonary hypertension, primary ([place]) 05/24/2017   Mild pulmonary hypertension ([place]) 12/03/2015   Allergic rhinoconjunctivitis 06/28/2015   Laryngopharyngeal reflux 06/28/2015   Essential (primary) hypertension 05/26/2014   Fatty infiltration of liver 05/26/2014   Dyslipidemia 05/26/2014   Mitral valve disease 05/26/2014   Elevation of level of transaminase or lactic acid dehydrogenase (LDH) 05/26/2014   Nodular goiter, non-toxic 05/26/2014   Adiposity 05/26/2014   OSA (obstructive sleep apnea) 05/26/2014   Plantar fasciitis 05/26/2014   Avitaminosis D 05/26/2014   Low back pain 05/26/2014   Myalgia and myositis 05/26/2014    Past Medical History:  Diagnosis Date   Adiposity 05/26/2014   Allergic rhinoconjunctivitis 06/28/2015    Allergies    grasses, dander, dust, etc, etc   Anxiety    situational   Arthritis    Avitaminosis D 05/26/2014   Breast cancer ([place])    De Quervain's tenosynovitis, left 11/08/2019   Elevation of level of transaminase or lactic acid dehydrogenase (LDH) 05/26/2014   Essential (primary) hypertension 05/26/2014   Family history of breast cancer    Family history of pancreatic cancer    Fatty infiltration of liver 05/26/2014   Fibromyalgia    Foot injury, left, initial encounter 05/17/2018   Genetic testing 08/01/2018   The Common Hereditary Cancer Panel offered by [facility] includes sequencing and/or deletion duplication testing of the following 55 genes: APC, ATM, AXIN2, BARD1, BLM, BMPR1A, BRCA1, BRCA2, BRIP1, BUB1B, CDH1, CDK4, CDKN2A, CEP57, CHEK2, CTNNA1, DICER1, ENG, EPCAM, GALNT12, GREM1, HOXB13, KIT, MEN1, MLH1, MLH3, MSH2, MSH3, MSH6, MUTYH, NBN, NF1, NTHL1, PALB2, PDGFRA, PMS2, POLD1, POLE, PTEN, RAD50,    GERD (gastroesophageal reflux disease)    Heart murmur    "not sure"   HLD (hyperlipidemia) 05/26/2014   Hypertension    Laryngopharyngeal reflux 06/28/2015   Low back pain 05/26/2014   Malignant neoplasm of upper-outer quadrant of right breast in female, estrogen receptor positive ([place]) 08/18/2018   Mild pulmonary hypertension ([place]) 12/03/2015   Overview:  48 mmHg in August 2017  Formatting of this note might be different from the original. Overview:  48 mmHg in August 2017 Formatting of this note might be different from the original. 48 mmHg in August 2017   Mitral valve disease 05/26/2014   MVP (mitral valve prolapse)  Myalgia and myositis 05/26/2014   Nodular goiter, non-toxic 05/26/2014   Obesity    OSA (obstructive sleep apnea) 05/26/2014   Perimenopausal    Plantar fasciitis 05/26/2014   Pulmonary hypertension, primary ([place]) 05/24/2017   Most likely related to sleep apnea   Shingles    Sleep apnea    uses CPAP   Treatment-emergent central sleep apnea 06/30/2017    Past  Surgical History:  Procedure Laterality Date   BREAST LUMPECTOMY WITH RADIOACTIVE SEED AND SENTINEL LYMPH NODE BIOPSY Right 08/11/2018   Procedure: RIGHT BREAST LUMPECTOMY WITH BRACKETED RADIOACTIVE SEED AND RIGHT SENTINEL LYMPH NODE BIOPSY;  Surgeon: Rolm Bookbinder, MD;  Location: [facility];  Service: General;  Laterality: Right;   KNEE SURGERY Right 06/1994    MEDS:   Current Outpatient Medications on File Prior to Visit  Medication Sig Dispense Refill   anastrozole (ARIMIDEX) 1 MG tablet TAKE 1 TABLET BY MOUTH EVERY DAY 90 tablet 3   calcium carbonate (OS-CAL - DOSED IN MG OF ELEMENTAL CALCIUM) 1250 (500 Ca) MG tablet Take by mouth.     candesartan (ATACAND) 8 MG tablet TAKE 1 TABLET BY MOUTH EVERY DAY 90 tablet 1   cetirizine (ZYRTEC) 10 MG tablet Take 10 mg by mouth daily.     Cholecalciferol (VITAMIN D) 2000 units CAPS Take 2,000 Units by mouth daily.      EPINEPHrine 0.3 mg/0.3 mL IJ SOAJ injection Inject 0.3 mg into the muscle once.      esomeprazole (NEXIUM) 20 MG capsule Take 20 mg by mouth daily at 12 noon.     ezetimibe (ZETIA) 10 MG tablet TAKE 1 TABLET BY MOUTH EVERY DAY 90 tablet 1   guaifenesin (HUMIBID E) 400 MG TABS tablet Take 400 mg by mouth daily as needed (for cough/congestion).     Magnesium 400 MG TABS Take by mouth.     meloxicam (MOBIC) 7.5 MG tablet Take 7.5 mg by mouth daily.     NASACORT ALLERGY 24HR 55 MCG/ACT AERO nasal inhaler Use one spray in each nostril once daily as directed. 1 Inhaler 0   Olopatadine HCl 0.6 % SOLN Can use one to two sprays in each nostril one to two times daily if needed. 30.5 g 11   valACYclovir (VALTREX) 1000 MG tablet Take 2 tabs and repeat in 12 hours. 30 tablet 1   VASCEPA 1 g capsule TAKE 1 CAPSULE BY MOUTH THREE TIMES A DAY 270 capsule 2   No current facility-administered medications on file prior to visit.    ALLERGIES: Penicillin g, Zinacef [cefuroxime in sterile water], Fenofibrate, Rosuvastatin, Tape, Augmentin  [amoxicillin-pot clavulanate], Desipramine hcl, Iodine, Sulfa antibiotics, Tetanus toxoids, Avelox [moxifloxacin hcl in nacl], and Diclofenac sodium  Family History  Problem Relation Age of Onset   Diabetes Mother    Hypertension Mother    Allergic rhinitis Mother    Kidney failure Mother    Hypertension Father    Heart attack Father    Infertility Sister    Cancer Paternal Grandmother    Asthma Maternal Grandfather    Pancreatic cancer Maternal Grandfather 72   Heart disease Paternal Aunt    Other Cousin        liver cancer/failure?   Breast cancer Other 45    SH:  married, non smoker  Review of Systems  Constitutional: Negative.   Musculoskeletal:  Positive for back pain.   PHYSICAL EXAMINATION:    BP (!) 148/55   Pulse 70   Wt 293 lb (132.9 kg)  LMP 11/02/2002 (Approximate)   BMI 47.29 kg/m     Physical Exam Constitutional:      Appearance: Normal appearance.  Neurological:     General: No focal deficit present.     Mental Status: She is alert.  Psychiatric:        Mood and Affect: Mood normal.    Assessment/Plan: 1. Urinary frequency - POCT Urinalysis Dipstick - Urine Culture - Urine Microscopic  2. Acute left-sided low back pain without sciatica  3. Abnormal urine color

## 2021-04-16 ENCOUNTER — Encounter: Payer: Self-pay | Admitting: Cardiology

## 2021-04-16 ENCOUNTER — Other Ambulatory Visit: Payer: Self-pay

## 2021-04-16 ENCOUNTER — Ambulatory Visit (INDEPENDENT_AMBULATORY_CARE_PROVIDER_SITE_OTHER): Payer: Medicare PPO | Admitting: Cardiology

## 2021-04-16 VITALS — BP 130/64 | HR 58 | Ht 66.0 in | Wt 293.0 lb

## 2021-04-16 DIAGNOSIS — I272 Pulmonary hypertension, unspecified: Secondary | ICD-10-CM | POA: Diagnosis not present

## 2021-04-16 DIAGNOSIS — I10 Essential (primary) hypertension: Secondary | ICD-10-CM | POA: Diagnosis not present

## 2021-04-16 DIAGNOSIS — G4733 Obstructive sleep apnea (adult) (pediatric): Secondary | ICD-10-CM

## 2021-04-16 DIAGNOSIS — C50411 Malignant neoplasm of upper-outer quadrant of right female breast: Secondary | ICD-10-CM | POA: Diagnosis not present

## 2021-04-16 DIAGNOSIS — E785 Hyperlipidemia, unspecified: Secondary | ICD-10-CM

## 2021-04-16 DIAGNOSIS — Z170 Estrogen receptor positive status [ER+]: Secondary | ICD-10-CM

## 2021-04-16 NOTE — Progress Notes (Signed)
lipid

## 2021-04-16 NOTE — Progress Notes (Signed)
Cardiology Office Note:    Date:  04/16/2021   ID:  Catherine [name] [place], [place] 17-Mar-1951, MRN [id number]  PCP:  Patient, No Pcp Per (Inactive)  Cardiologist:  Gypsy Balsam, MD    Referring MD: No ref. provider found   Chief Complaint  Patient presents with   Follow-up  I am doing fine  History of Present Illness:    Catherine [name] is a 70 y.o. female with past medical history significant for essential hypertension, dyslipidemia, hypothyroidism, obstructive sleep apnea, mild pulmonary hypertension with -40 mmHg felt to be related to obstructive sleep apnea. She is coming to our office for follow-up today.  She is doing well she is trying to be able be more active still works and enjoying her work.  Denies have any chest pain tightness squeezing pressure burning chest.  Previous complaint she had about some discomfort in lower extremities disappeared.  She still does have 5 dogs and she likes to walk with them.  She is enjoying her time at the beach as well.  No palpitations no dizziness no passing out  Past Medical History:  Diagnosis Date   Adiposity 05/26/2014   Allergic rhinoconjunctivitis 06/28/2015   Allergies    grasses, dander, dust, etc, etc   Anxiety    situational   Arthritis    Avitaminosis D 05/26/2014   Breast cancer ([facility])    De Quervain's tenosynovitis, left 11/08/2019   Elevation of level of transaminase or lactic acid dehydrogenase (LDH) 05/26/2014   Essential (primary) hypertension 05/26/2014   Family history of breast cancer    Family history of pancreatic cancer    Fatty infiltration of liver 05/26/2014   Fibromyalgia    Foot injury, left, initial encounter 05/17/2018   Genetic testing 08/01/2018   The Common Hereditary Cancer Panel offered by [facility] includes sequencing and/or deletion duplication testing of the following 55 genes: APC, ATM, AXIN2, BARD1, BLM, BMPR1A, BRCA1, BRCA2, BRIP1, BUB1B, CDH1, CDK4, CDKN2A, CEP57, CHEK2, CTNNA1, DICER1, ENG, EPCAM, GALNT12,  GREM1, HOXB13, KIT, MEN1, MLH1, MLH3, MSH2, MSH3, MSH6, MUTYH, NBN, NF1, NTHL1, PALB2, PDGFRA, PMS2, POLD1, POLE, PTEN, RAD50,    GERD (gastroesophageal reflux disease)    Heart murmur    "not sure"   HLD (hyperlipidemia) 05/26/2014   Hypertension    Laryngopharyngeal reflux 06/28/2015   Low back pain 05/26/2014   Malignant neoplasm of upper-outer quadrant of right breast in female, estrogen receptor positive ([facility]) 08/18/2018   Mild pulmonary hypertension ([facility]) 12/03/2015   Overview:  48 mmHg in August 2017  Formatting of this note might be different from the original. Overview:  48 mmHg in August 2017 Formatting of this note might be different from the original. 48 mmHg in August 2017   Mitral valve disease 05/26/2014   MVP (mitral valve prolapse)    Myalgia and myositis 05/26/2014   Nodular goiter, non-toxic 05/26/2014   Obesity    OSA (obstructive sleep apnea) 05/26/2014   Perimenopausal    Plantar fasciitis 05/26/2014   Pulmonary hypertension, primary ([facility]) 05/24/2017   Most likely related to sleep apnea   Shingles    Sleep apnea    uses CPAP   Treatment-emergent central sleep apnea 06/30/2017    Past Surgical History:  Procedure Laterality Date   BREAST LUMPECTOMY WITH RADIOACTIVE SEED AND SENTINEL LYMPH NODE BIOPSY Right 08/11/2018   Procedure: RIGHT BREAST LUMPECTOMY WITH BRACKETED RADIOACTIVE SEED AND RIGHT SENTINEL LYMPH NODE BIOPSY;  Surgeon: Emelia Loron, MD;  Location[facility];  Service: General;  Laterality: Right;   KNEE SURGERY Right 06/1994    Current Medications: Current Meds  Medication Sig   anastrozole (ARIMIDEX) 1 MG tablet TAKE 1 TABLET BY MOUTH EVERY DAY   calcium carbonate (OS-CAL - DOSED IN MG OF ELEMENTAL CALCIUM) 1250 (500 Ca) MG tablet Take by mouth.   candesartan (ATACAND) 8 MG tablet TAKE 1 TABLET BY MOUTH EVERY DAY   cetirizine (ZYRTEC) 10 MG tablet Take 10 mg by mouth daily.   Cholecalciferol (VITAMIN D) 2000 units CAPS Take 2,000 Units by mouth daily.     EPINEPHrine 0.3 mg/0.3 mL IJ SOAJ injection Inject 0.3 mg into the muscle once.    esomeprazole (NEXIUM) 20 MG capsule Take 20 mg by mouth daily at 12 noon.   ezetimibe (ZETIA) 10 MG tablet TAKE 1 TABLET BY MOUTH EVERY DAY   guaifenesin (HUMIBID E) 400 MG TABS tablet Take 400 mg by mouth daily as needed (for cough/congestion).   Magnesium 400 MG TABS Take by mouth.   meloxicam (MOBIC) 7.5 MG tablet Take 7.5 mg by mouth daily.   NASACORT ALLERGY 24HR 55 MCG/ACT AERO nasal inhaler Use one spray in each nostril once daily as directed.   Olopatadine HCl 0.6 % SOLN Can use one to two sprays in each nostril one to two times daily if needed.   valACYclovir (VALTREX) 1000 MG tablet Take 2 tabs and repeat in 12 hours.   VASCEPA 1 g capsule TAKE 1 CAPSULE BY MOUTH THREE TIMES A DAY     Allergies:   Penicillin g, Zinacef [cefuroxime in sterile water], Fenofibrate, Rosuvastatin, Tape, Augmentin [amoxicillin-pot clavulanate], Desipramine hcl, Iodine, Sulfa antibiotics, Tetanus toxoids, Avelox [moxifloxacin hcl in nacl], and Diclofenac sodium   Social History   Socioeconomic History   Marital status: Married    Spouse name: Not on file   Number of children: Not on file   Years of education: Not on file   Highest education level: Not on file  Occupational History   Not on file  Tobacco Use   Smoking status: Never   Smokeless tobacco: Never  Vaping Use   Vaping Use: Never used  Substance and Sexual Activity   Alcohol use: No   Drug use: No   Sexual activity: Yes    Birth control/protection: Post-menopausal  Other Topics Concern   Not on file  Social History Narrative   Not on file   Social Determinants of Health   Financial Resource Strain: Not on file  Food Insecurity: Not on file  Transportation Needs: Not on file  Physical Activity: Not on file  Stress: Not on file  Social Connections: Not on file     Family History: The patient's family history includes Allergic rhinitis in  her mother; Asthma in her maternal grandfather; Breast cancer (age of onset: 79) in an other family member; Cancer in her paternal grandmother; Diabetes in her mother; Heart attack in her father; Heart disease in her paternal aunt; Hypertension in her father and mother; Infertility in her sister; Kidney failure in her mother; Other in her cousin; Pancreatic cancer (age of onset: 71) in her maternal grandfather. ROS:   Please see the history of present illness.    All 14 point review of systems negative except as described per history of present illness  EKGs/Labs/Other Studies Reviewed:      Recent Labs: 11/13/2020: ALT 22; BUN 14; Creatinine 0.8; Hemoglobin 12.5; Platelets 291; Potassium 4.0; Sodium 139; TSH 2.695  Recent Lipid Panel    Component Value  Date/Time   CHOL 179 11/12/2017 0916   TRIG 182 (H) 11/12/2017 0916   HDL 44 11/12/2017 0916   CHOLHDL 4.1 11/12/2017 0916   CHOLHDL 4.8 06/24/2016 1342   VLDL 35 (H) 06/24/2016 1342   LDLCALC 99 11/12/2017 0916    Physical Exam:    VS:  BP 130/64 (BP Location: Left Arm, Patient Position: Sitting, Cuff Size: Large)   Pulse (!) 58   Ht $R'5\' 6"'qN$  (1.676 m)   Wt 293 lb (132.9 kg)   LMP 11/02/2002 (Approximate)   SpO2 98%   BMI 47.29 kg/m     Wt Readings from Last 3 Encounters:  04/16/21 293 lb (132.9 kg)  04/14/21 293 lb (132.9 kg)  02/03/21 291 lb 14.4 oz (132.4 kg)     GEN:  Well nourished, well developed in no acute distress HEENT: Normal NECK: No JVD; No carotid bruits LYMPHATICS: No lymphadenopathy CARDIAC: RRR, no murmurs, no rubs, no gallops RESPIRATORY:  Clear to auscultation without rales, wheezing or rhonchi  ABDOMEN: Soft, non-tender, non-distended MUSCULOSKELETAL:  No edema; No deformity  SKIN: Warm and dry LOWER EXTREMITIES: no swelling NEUROLOGIC:  Alert and oriented x 3 PSYCHIATRIC:  Normal affect   ASSESSMENT:    1. Essential (primary) hypertension   2. OSA (obstructive sleep apnea)   3. Mild pulmonary  hypertension ([place])   4. Malignant neoplasm of upper-outer quadrant of right breast in female, estrogen receptor positive ([place])   5. Dyslipidemia    PLAN:    In order of problems listed above:  Essential hypertension blood pressure well controlled I will continue present management Obstructive sleep apnea: Being addressed by pulmonary. Mild pulmonary hypertension felt to be related to obstructive sleep apnea. When I see her we will repeat echocardiogram, last echocardiogram did not show any significant elevation of the pulmonary pressure. Malignant breast cancer: That being followed by oncology team. Dyslipidemia: I did review her K PN which show me data from 2019 with LDL of 99 HDL 44.  We will schedule her to have fasting profile done.  She is taking Vascepa and Zetia.   Medication Adjustments/Labs and Tests Ordered: Current medicines are reviewed at length with the patient today.  Concerns regarding medicines are outlined above.  No orders of the defined types were placed in this encounter.  Medication changes: No orders of the defined types were placed in this encounter.   Signed, Park Liter, MD, [facility] 04/16/2021 11:36 AM    [place]

## 2021-04-16 NOTE — Patient Instructions (Signed)
Medication Instructions:  Your physician recommends that you continue on your current medications as directed. Please refer to the Current Medication list given to you today.  *If you need a refill on your cardiac medications before your next appointment, please call your pharmacy*   Lab Work: Your physician recommends that you return for lab work today: lipid  If you have labs (blood work) drawn today and your tests are completely normal, you will receive your results only by: [place] (if you have MyChart) OR A paper copy in the mail If you have any lab test that is abnormal or we need to change your treatment, we will call you to review the results.   Testing/Procedures: None   Follow-Up: At [facility], you and your health needs are our priority.  As part of our continuing mission to provide you with exceptional heart care, we have created designated Provider Care Teams.  These Care Teams include your primary Cardiologist (physician) and Advanced Practice Providers (APPs -  Physician Assistants and Nurse Practitioners) who all work together to provide you with the care you need, when you need it.  We recommend signing up for the patient portal called "MyChart".  Sign up information is provided on this After Visit Summary.  MyChart is used to connect with patients for Virtual Visits (Telemedicine).  Patients are able to view lab/test results, encounter notes, upcoming appointments, etc.  Non-urgent messages can be sent to your provider as well.   To learn more about what you can do with MyChart, go to [email or web address].    Your next appointment:   1 year(s)  The format for your next appointment:   In Person  Provider:   Jenne Campus, MD   Other Instructions

## 2021-04-16 NOTE — Addendum Note (Signed)
Addended by: Jerl Santos R on: 04/16/2021 01:32 PM   Modules accepted: Orders

## 2021-04-17 LAB — LIPID PANEL
Chol/HDL Ratio: 4 ratio (ref 0.0–4.4)
Cholesterol, Total: 185 mg/dL (ref 100–199)
HDL: 46 mg/dL (ref >39)
LDL Chol Calc (NIH): 108 mg/dL — ABNORMAL HIGH (ref 0–99)
Triglycerides: 178 mg/dL — ABNORMAL HIGH (ref 0–149)
VLDL Cholesterol Cal: 31 mg/dL (ref 5–40)

## 2021-04-18 LAB — URINALYSIS, ROUTINE W REFLEX MICROSCOPIC

## 2021-04-18 LAB — SPECIMEN STATUS REPORT

## 2021-04-18 LAB — URINE CULTURE

## 2021-04-21 ENCOUNTER — Telehealth (HOSPITAL_BASED_OUTPATIENT_CLINIC_OR_DEPARTMENT_OTHER): Payer: Self-pay

## 2021-04-21 NOTE — Telephone Encounter (Signed)
-----   Message from Megan Salon, MD sent at 04/20/2021  7:04 PM EDT ----- Please let pt know her urine did have some bacteria in it but not in enough quantity to determine exactly what type.  Unless symptoms have worsened, she doesn't need treatment for this.  Thank you.

## 2021-04-21 NOTE — Telephone Encounter (Signed)
Catherine [name] is a 70 y.o. female was contacted.  Pt verified using 2 identifiers. Confirmation that I am speaking with the correct person.   Pt notified of the message from Dr. Sabra Heck. Pt said her back pain resolved on its own after seeing the chiropractor. Pt said she has no sx at all.

## 2021-04-21 NOTE — Telephone Encounter (Signed)
Attempt made to contact [facility] is a 70 y.o. female re: message from Dr. Sabra Heck below.  Pt was not available.  LM on the VM for the patient to call me back re: Message below from [place]

## 2021-05-06 ENCOUNTER — Ambulatory Visit (INDEPENDENT_AMBULATORY_CARE_PROVIDER_SITE_OTHER): Payer: Medicare PPO | Admitting: *Deleted

## 2021-05-06 DIAGNOSIS — J309 Allergic rhinitis, unspecified: Secondary | ICD-10-CM

## 2021-05-26 ENCOUNTER — Ambulatory Visit (INDEPENDENT_AMBULATORY_CARE_PROVIDER_SITE_OTHER): Payer: Medicare PPO | Admitting: *Deleted

## 2021-05-26 DIAGNOSIS — J309 Allergic rhinitis, unspecified: Secondary | ICD-10-CM | POA: Diagnosis not present
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
## 2021-06-05 ENCOUNTER — Inpatient Hospital Stay: Payer: Medicare PPO | Attending: Oncology

## 2021-06-05 ENCOUNTER — Telehealth: Payer: Self-pay | Admitting: Oncology

## 2021-06-05 ENCOUNTER — Other Ambulatory Visit: Payer: Self-pay | Admitting: Oncology

## 2021-06-05 ENCOUNTER — Encounter: Payer: Self-pay | Admitting: Oncology

## 2021-06-05 ENCOUNTER — Inpatient Hospital Stay (INDEPENDENT_AMBULATORY_CARE_PROVIDER_SITE_OTHER): Payer: Medicare PPO | Admitting: Oncology

## 2021-06-05 ENCOUNTER — Other Ambulatory Visit: Payer: Self-pay

## 2021-06-05 VITALS — BP 176/80 | HR 73 | Temp 98.5°F | Resp 18 | Ht 66.0 in | Wt 295.0 lb

## 2021-06-05 DIAGNOSIS — Z923 Personal history of irradiation: Secondary | ICD-10-CM | POA: Insufficient documentation

## 2021-06-05 DIAGNOSIS — Z170 Estrogen receptor positive status [ER+]: Secondary | ICD-10-CM

## 2021-06-05 DIAGNOSIS — C50911 Malignant neoplasm of unspecified site of right female breast: Secondary | ICD-10-CM | POA: Diagnosis present

## 2021-06-05 DIAGNOSIS — C50411 Malignant neoplasm of upper-outer quadrant of right female breast: Secondary | ICD-10-CM

## 2021-06-05 DIAGNOSIS — M85851 Other specified disorders of bone density and structure, right thigh: Secondary | ICD-10-CM | POA: Insufficient documentation

## 2021-06-05 DIAGNOSIS — Z79811 Long term (current) use of aromatase inhibitors: Secondary | ICD-10-CM | POA: Insufficient documentation

## 2021-06-05 DIAGNOSIS — E039 Hypothyroidism, unspecified: Secondary | ICD-10-CM | POA: Insufficient documentation

## 2021-06-05 LAB — CBC AND DIFFERENTIAL
HCT: 36 (ref 36–46)
Hemoglobin: 12.2 (ref 12.0–16.0)
Neutrophils Absolute: 3.94
Platelets: 316 (ref 150–399)
WBC: 6.8

## 2021-06-05 LAB — BASIC METABOLIC PANEL
BUN: 18 (ref 4–21)
CO2: 23 — AB (ref 13–22)
Chloride: 105 (ref 99–108)
Creatinine: 0.8 (ref 0.5–1.1)
Glucose: 107
Potassium: 4.2 (ref 3.4–5.3)
Sodium: 138 (ref 137–147)

## 2021-06-05 LAB — COMPREHENSIVE METABOLIC PANEL
Albumin: 4.1 (ref 3.5–5.0)
Calcium: 9.2 (ref 8.7–10.7)

## 2021-06-05 LAB — HEPATIC FUNCTION PANEL
ALT: 21 (ref 7–35)
AST: 27 (ref 13–35)
Alkaline Phosphatase: 96 (ref 25–125)
Bilirubin, Total: 0.4

## 2021-06-05 LAB — TSH: TSH: 2.267 u[IU]/mL (ref 0.350–4.500)

## 2021-06-05 LAB — CBC: RBC: 4.12 (ref 3.87–5.11)

## 2021-06-05 NOTE — Telephone Encounter (Signed)
Per 8/4 LOS, patient scheduled for Feb 2023 Appt's.  Gave patient Appt Summary

## 2021-06-06 ENCOUNTER — Telehealth: Payer: Self-pay

## 2021-06-06 LAB — T4: T4, Total: 9 ug/dL (ref 4.5–12.0)

## 2021-06-06 LAB — T3 UPTAKE: T3 Uptake Ratio: 23 % — ABNORMAL LOW (ref 24–39)

## 2021-06-06 NOTE — Telephone Encounter (Signed)
-----   Message from Derwood Kaplan, MD sent at 06/06/2021  9:15 AM EDT ----- Regarding: call pt Tell her thyroid looks good, pls send copies of all labs to Dr.Monica Doerr in [facility]

## 2021-06-06 NOTE — Telephone Encounter (Signed)
-----   Message from Derwood Kaplan, MD sent at 06/06/2021  9:15 AM EDT ----- Regarding: call pt Tell her thyroid looks good, pls send copies of all labs to Dr.Monica Doerr in [facility]

## 2021-06-06 NOTE — Telephone Encounter (Signed)
Patient notified

## 2021-06-25 ENCOUNTER — Ambulatory Visit (INDEPENDENT_AMBULATORY_CARE_PROVIDER_SITE_OTHER): Payer: Medicare PPO | Admitting: *Deleted

## 2021-06-25 DIAGNOSIS — J309 Allergic rhinitis, unspecified: Secondary | ICD-10-CM

## 2021-07-03 DIAGNOSIS — H2513 Age-related nuclear cataract, bilateral: Secondary | ICD-10-CM | POA: Diagnosis not present

## 2021-07-03 DIAGNOSIS — H524 Presbyopia: Secondary | ICD-10-CM | POA: Diagnosis not present

## 2021-07-03 DIAGNOSIS — H43813 Vitreous degeneration, bilateral: Secondary | ICD-10-CM | POA: Diagnosis not present

## 2021-07-03 DIAGNOSIS — H40003 Preglaucoma, unspecified, bilateral: Secondary | ICD-10-CM | POA: Diagnosis not present

## 2021-07-08 ENCOUNTER — Encounter: Payer: Self-pay | Admitting: Oncology

## 2021-07-09 ENCOUNTER — Encounter: Payer: Self-pay | Admitting: Pulmonary Disease

## 2021-07-09 ENCOUNTER — Ambulatory Visit: Payer: Medicare PPO | Admitting: Pulmonary Disease

## 2021-07-09 ENCOUNTER — Other Ambulatory Visit: Payer: Self-pay

## 2021-07-09 VITALS — BP 136/76 | HR 78 | Ht 65.0 in | Wt 296.0 lb

## 2021-07-09 DIAGNOSIS — Z9989 Dependence on other enabling machines and devices: Secondary | ICD-10-CM

## 2021-07-09 DIAGNOSIS — E669 Obesity, unspecified: Secondary | ICD-10-CM | POA: Diagnosis not present

## 2021-07-09 DIAGNOSIS — G4733 Obstructive sleep apnea (adult) (pediatric): Secondary | ICD-10-CM | POA: Diagnosis not present

## 2021-07-09 DIAGNOSIS — G4730 Sleep apnea, unspecified: Secondary | ICD-10-CM

## 2021-07-09 NOTE — Patient Instructions (Signed)
Follow up in 1 year.
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
## 2021-07-21 ENCOUNTER — Ambulatory Visit (INDEPENDENT_AMBULATORY_CARE_PROVIDER_SITE_OTHER): Payer: Medicare PPO

## 2021-07-21 DIAGNOSIS — J309 Allergic rhinitis, unspecified: Secondary | ICD-10-CM

## 2021-07-23 ENCOUNTER — Other Ambulatory Visit: Payer: Self-pay | Admitting: Cardiology

## 2021-07-29 ENCOUNTER — Other Ambulatory Visit: Payer: Self-pay

## 2021-07-29 ENCOUNTER — Ambulatory Visit (HOSPITAL_BASED_OUTPATIENT_CLINIC_OR_DEPARTMENT_OTHER)
Admission: RE | Admit: 2021-07-29 | Discharge: 2021-07-29 | Disposition: A | Discharge status: Home / Self Care | Payer: Medicare PPO | Source: Ambulatory Visit | Attending: Obstetrics & Gynecology | Admitting: Obstetrics & Gynecology

## 2021-07-29 ENCOUNTER — Ambulatory Visit (HOSPITAL_BASED_OUTPATIENT_CLINIC_OR_DEPARTMENT_OTHER): Payer: Medicare PPO | Admitting: Obstetrics & Gynecology

## 2021-07-29 DIAGNOSIS — N83202 Unspecified ovarian cyst, left side: Secondary | ICD-10-CM

## 2021-07-29 DIAGNOSIS — R9389 Abnormal findings on diagnostic imaging of other specified body structures: Secondary | ICD-10-CM

## 2021-07-30 ENCOUNTER — Ambulatory Visit (HOSPITAL_BASED_OUTPATIENT_CLINIC_OR_DEPARTMENT_OTHER): Payer: Medicare PPO | Admitting: Obstetrics & Gynecology

## 2021-08-11 ENCOUNTER — Ambulatory Visit (INDEPENDENT_AMBULATORY_CARE_PROVIDER_SITE_OTHER): Payer: Medicare PPO

## 2021-08-11 ENCOUNTER — Inpatient Hospital Stay: Payer: Medicare PPO

## 2021-08-11 DIAGNOSIS — J309 Allergic rhinitis, unspecified: Secondary | ICD-10-CM

## 2021-08-11 NOTE — Progress Notes (Signed)
VIALS MADE. EXP 08-11-22 

## 2021-08-12 DIAGNOSIS — J3089 Other allergic rhinitis: Secondary | ICD-10-CM | POA: Diagnosis not present

## 2021-08-13 DIAGNOSIS — J301 Allergic rhinitis due to pollen: Secondary | ICD-10-CM | POA: Diagnosis not present

## 2021-08-19 ENCOUNTER — Inpatient Hospital Stay: Payer: Medicare PPO | Attending: Oncology

## 2021-08-19 ENCOUNTER — Other Ambulatory Visit: Payer: Self-pay

## 2021-08-19 VITALS — BP 145/61 | HR 87 | Temp 98.5°F | Resp 18 | Ht 66.0 in | Wt 301.8 lb

## 2021-08-19 DIAGNOSIS — Z23 Encounter for immunization: Secondary | ICD-10-CM | POA: Insufficient documentation

## 2021-08-19 MED ORDER — INFLUENZA VAC SPLIT QUAD 0.5 ML IM SUSY
0.5000 mL | PREFILLED_SYRINGE | Freq: Once | INTRAMUSCULAR | Status: AC
  Administered 2021-08-19: 0.5 mL via INTRAMUSCULAR
  Filled 2021-08-19: qty 0.5

## 2021-08-19 NOTE — Progress Notes (Signed)
1521: PT STABLE AT TIME OF DISCHARGE  

## 2021-09-01 ENCOUNTER — Ambulatory Visit (INDEPENDENT_AMBULATORY_CARE_PROVIDER_SITE_OTHER): Payer: Medicare PPO

## 2021-09-01 ENCOUNTER — Telehealth: Payer: Self-pay | Admitting: Allergy and Immunology

## 2021-09-01 DIAGNOSIS — J309 Allergic rhinitis, unspecified: Secondary | ICD-10-CM | POA: Diagnosis not present

## 2021-09-01 NOTE — Telephone Encounter (Signed)
Contacted patient to schedule a yearly follow up visit due to being on injections.

## 2021-09-22 ENCOUNTER — Ambulatory Visit (INDEPENDENT_AMBULATORY_CARE_PROVIDER_SITE_OTHER): Payer: Medicare PPO

## 2021-09-22 DIAGNOSIS — J309 Allergic rhinitis, unspecified: Secondary | ICD-10-CM

## 2021-10-02 ENCOUNTER — Other Ambulatory Visit: Payer: Self-pay | Admitting: Cardiology

## 2021-10-06 ENCOUNTER — Encounter: Payer: Self-pay | Admitting: Allergy and Immunology

## 2021-10-06 ENCOUNTER — Ambulatory Visit (INDEPENDENT_AMBULATORY_CARE_PROVIDER_SITE_OTHER): Payer: Medicare PPO | Admitting: Allergy and Immunology

## 2021-10-06 ENCOUNTER — Other Ambulatory Visit: Payer: Self-pay

## 2021-10-06 VITALS — BP 148/68 | HR 68 | Resp 20 | Ht 63.8 in | Wt 294.0 lb

## 2021-10-06 DIAGNOSIS — J3089 Other allergic rhinitis: Secondary | ICD-10-CM

## 2021-10-06 DIAGNOSIS — K219 Gastro-esophageal reflux disease without esophagitis: Secondary | ICD-10-CM

## 2021-10-06 DIAGNOSIS — J309 Allergic rhinitis, unspecified: Secondary | ICD-10-CM | POA: Diagnosis not present

## 2021-10-06 DIAGNOSIS — H6981 Other specified disorders of Eustachian tube, right ear: Secondary | ICD-10-CM

## 2021-10-06 MED ORDER — EPINEPHRINE 0.3 MG/0.3ML IJ SOAJ: INTRAMUSCULAR | 3 refills | Status: AC
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
## 2021-10-06 NOTE — Patient Instructions (Addendum)
  1.  Continue immunotherapy  2.  Continue Zyrtec 10 mg tablet 1 time per day if needed  3.  Continue OTC Nasacort 1-2 sprays each nostril 1 time per day if needed  4.  Continue Nexium 20 mg daily  5.  Prednisone 20 mg dose delivered in clinic today only  6.  Further evaluation and treatment?  7.  Return to clinic in 1 year or earlier if problem

## 2021-10-07 ENCOUNTER — Encounter: Payer: Self-pay | Admitting: Allergy and Immunology

## 2021-10-14 DIAGNOSIS — L602 Onychogryphosis: Secondary | ICD-10-CM | POA: Diagnosis not present

## 2021-10-20 ENCOUNTER — Ambulatory Visit (INDEPENDENT_AMBULATORY_CARE_PROVIDER_SITE_OTHER): Payer: Medicare PPO | Admitting: *Deleted

## 2021-10-20 DIAGNOSIS — J309 Allergic rhinitis, unspecified: Secondary | ICD-10-CM | POA: Diagnosis not present

## 2021-10-21 ENCOUNTER — Other Ambulatory Visit: Payer: Self-pay | Admitting: Hematology and Oncology

## 2021-10-21 DIAGNOSIS — Z170 Estrogen receptor positive status [ER+]: Secondary | ICD-10-CM

## 2021-10-21 DIAGNOSIS — C50411 Malignant neoplasm of upper-outer quadrant of right female breast: Secondary | ICD-10-CM

## 2021-11-06 ENCOUNTER — Ambulatory Visit (INDEPENDENT_AMBULATORY_CARE_PROVIDER_SITE_OTHER): Payer: Medicare PPO | Admitting: *Deleted

## 2021-11-06 DIAGNOSIS — J309 Allergic rhinitis, unspecified: Secondary | ICD-10-CM

## 2021-11-11 ENCOUNTER — Ambulatory Visit
Admission: EM | Admit: 2021-11-11 | Discharge: 2021-11-11 | Disposition: A | Discharge status: Home / Self Care | Payer: Medicare PPO | Attending: Emergency Medicine | Admitting: Emergency Medicine

## 2021-11-11 ENCOUNTER — Other Ambulatory Visit: Payer: Self-pay

## 2021-11-11 DIAGNOSIS — J310 Chronic rhinitis: Secondary | ICD-10-CM | POA: Diagnosis not present

## 2021-11-11 DIAGNOSIS — J329 Chronic sinusitis, unspecified: Secondary | ICD-10-CM | POA: Diagnosis not present

## 2021-11-11 NOTE — Discharge Instructions (Signed)
The results of your COVID and influenza test should be available in the next 24 to 48 hours.  Initially they will be posted to your MyChart account.  If either 1 is positive, you will be contacted by phone with further instructions.  Please continue Zyrtec, Flonase and olopatadine eyedrops for your symptoms.  Please follow-up with your allergist as planned.

## 2021-11-11 NOTE — ED Provider Notes (Signed)
[facility]    CSN: [id number] Arrival date & time: 11/11/21  1552    HISTORY   Chief Complaint  Patient presents with   Nasal Congestion   HPI Catherine [name] is a 71 y.o. female. Pt reports having congestion and that began Sunday. At home covid test was negative.  EMR reviewed, patient is currently undergoing allergy immunotherapy for allergic rhinitis.  Patient's last visit with them was on November 06, 2021.  She has had congestion and tears from her eyes that her different than usual.  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  Patient states the fresh air helped.  Patient states she decided to come in to urgent care today for COVID and flu testing.  Patient denies fever, aches, chills, nausea, vomiting, diarrhea, headache, loss of taste or smell, sore throat, dry cough.  The history is provided by the patient.  Past Medical History:  Diagnosis Date   Adiposity 05/26/2014   Allergic rhinoconjunctivitis 06/28/2015   Allergies    grasses, dander, dust, etc, etc   Anxiety    situational   Arthritis    Avitaminosis D 05/26/2014   Breast cancer (HCC)    De Quervain's tenosynovitis, left 11/08/2019   Elevation of level of transaminase or lactic acid dehydrogenase (LDH) 05/26/2014   Essential (primary) hypertension 05/26/2014   Family history of breast cancer    Family history of pancreatic cancer    Fatty infiltration of liver 05/26/2014   Fibromyalgia    Foot injury, left, initial encounter 05/17/2018   Genetic testing 08/01/2018   The Common Hereditary Cancer Panel offered by [facility] includes sequencing and/or deletion duplication testing of the following 55 genes: APC, ATM, AXIN2, BARD1, BLM, BMPR1A, BRCA1, BRCA2, BRIP1, BUB1B, CDH1, CDK4, CDKN2A, CEP57, CHEK2, CTNNA1, DICER1, ENG, EPCAM, GALNT12, GREM1, HOXB13, KIT, MEN1, MLH1, MLH3, MSH2, MSH3, MSH6, MUTYH, NBN, NF1, NTHL1, PALB2, PDGFRA,  PMS2, POLD1, POLE, PTEN, RAD50,    GERD (gastroesophageal reflux disease)    Heart murmur    "not sure"   HLD (hyperlipidemia) 05/26/2014   Hypertension    Laryngopharyngeal reflux 06/28/2015   Low back pain 05/26/2014   Malignant neoplasm of upper-outer quadrant of right breast in female, estrogen receptor positive (HCC) 08/18/2018   Mild pulmonary hypertension (HCC) 12/03/2015   Overview:  48 mmHg in August 2017  Formatting of this note might be different from the original. Overview:  48 mmHg in August 2017 Formatting of this note might be different from the original. 48 mmHg in August 2017   Mitral valve disease 05/26/2014   MVP (mitral valve prolapse)    Myalgia and myositis 05/26/2014   Nodular goiter, non-toxic 05/26/2014   Obesity    OSA (obstructive sleep apnea) 05/26/2014   Perimenopausal    Plantar fasciitis 05/26/2014   Pulmonary hypertension, primary (HCC) 05/24/2017   Most likely related to sleep apnea   Shingles    Sleep apnea    uses CPAP   Treatment-emergent central sleep apnea 06/30/2017   Patient Active Problem List   Diagnosis Date Noted   Tommi Rumps Quervain's disease (radial styloid tenosynovitis) 11/08/2019   Malignant neoplasm of upper-outer quadrant of right breast in female, estrogen receptor positive (HCC) 08/18/2018   Family history of breast cancer    Family history of pancreatic cancer    Foot injury, left, initial encounter 05/17/2018   Treatment-emergent central sleep apnea 06/30/2017  Pulmonary hypertension, primary ([place]) 05/24/2017   Mild pulmonary hypertension ([place]) 12/03/2015   Allergic rhinoconjunctivitis 06/28/2015   Laryngopharyngeal reflux 06/28/2015   Essential (primary) hypertension 05/26/2014   Fatty infiltration of liver 05/26/2014   Dyslipidemia 05/26/2014   Mitral valve disease 05/26/2014   Elevation of level of transaminase or lactic acid dehydrogenase (LDH) 05/26/2014   Nodular goiter, non-toxic 05/26/2014   Adiposity 05/26/2014   OSA  (obstructive sleep apnea) 05/26/2014   Plantar fasciitis 05/26/2014   Avitaminosis D 05/26/2014   Low back pain 05/26/2014   Myalgia and myositis 05/26/2014   Past Surgical History:  Procedure Laterality Date   BREAST LUMPECTOMY WITH RADIOACTIVE SEED AND SENTINEL LYMPH NODE BIOPSY Right 08/11/2018   Procedure: RIGHT BREAST LUMPECTOMY WITH BRACKETED RADIOACTIVE SEED AND RIGHT SENTINEL LYMPH NODE BIOPSY;  Surgeon: Rolm Bookbinder, MD;  Location: [facility];  Service: General;  Laterality: Right;   KNEE SURGERY Right 06/1994   OB History     Gravida  0   Para  0   Term  0   Preterm  0   AB  0   Living  0      SAB  0   IAB  0   Ectopic  0   Multiple  0   Live Births  0          Home Medications    Prior to Admission medications   Medication Sig Start Date End Date Taking? Authorizing Provider  anastrozole (ARIMIDEX) 1 MG tablet TAKE 1 TABLET BY MOUTH EVERY DAY 10/22/21   Mosher, Vida Roller A, PA-C  calcium carbonate (OS-CAL - DOSED IN MG OF ELEMENTAL CALCIUM) 1250 (500 Ca) MG tablet Take 1 tablet by mouth daily.    [provider]  candesartan (ATACAND) 8 MG tablet TAKE 1 TABLET BY MOUTH EVERY DAY 10/02/21   Park Liter, MD  cetirizine (ZYRTEC) 10 MG tablet Take 10 mg by mouth daily.    [provider]  Cholecalciferol (VITAMIN D) 2000 units CAPS Take 2,000 Units by mouth daily.     [provider]  EPINEPHrine 0.3 mg/0.3 mL IJ SOAJ injection Use as directed for life-threatening allergic reaction. 10/06/21   Kozlow, Donnamarie Poag, MD  esomeprazole (NEXIUM) 20 MG capsule Take 20 mg by mouth daily at 12 noon.    [provider]  ezetimibe (ZETIA) 10 MG tablet TAKE 1 TABLET BY MOUTH EVERY DAY 07/24/21   Park Liter, MD  guaifenesin (HUMIBID E) 400 MG TABS tablet Take 400 mg by mouth daily as needed (for cough/congestion).    [provider]  Magnesium 400 MG TABS Take 400 mg by mouth daily.    [provider]   meloxicam (MOBIC) 7.5 MG tablet Take 7.5 mg by mouth daily. 09/23/20   [provider]  NASACORT ALLERGY 24HR 55 MCG/ACT AERO nasal inhaler Use one spray in each nostril once daily as directed. 07/19/17   Kozlow, Donnamarie Poag, MD  Olopatadine HCl 0.6 % SOLN Can use one to two sprays in each nostril one to two times daily if needed. Patient not taking: Reported on 10/06/2021 07/19/17   Jiles Prows, MD  valACYclovir (VALTREX) 1000 MG tablet Take 2 tabs and repeat in 12 hours. 01/30/21   Megan Salon, MD  VASCEPA 1 g capsule TAKE 1 CAPSULE BY MOUTH THREE TIMES A DAY 01/13/21   Park Liter, MD   Family History Family History  Problem Relation Age of Onset   Diabetes Mother  Hypertension Mother    Allergic rhinitis Mother    Kidney failure Mother    Hypertension Father    Heart attack Father    Infertility Sister    Cancer Paternal Grandmother    Asthma Maternal Grandfather    Pancreatic cancer Maternal Grandfather 44   Heart disease Paternal Aunt    Other Cousin        liver cancer/failure?   Breast cancer Other 53   Social History Social History   Tobacco Use   Smoking status: Never   Smokeless tobacco: Never  Vaping Use   Vaping Use: Never used  Substance Use Topics   Alcohol use: No   Drug use: No   Allergies   Penicillin g, Zinacef [cefuroxime in sterile water], Fenofibrate, Rosuvastatin, Tape, Augmentin [amoxicillin-pot clavulanate], Desipramine hcl, Iodine, Sulfa antibiotics, Tetanus toxoids, Avelox [moxifloxacin hcl in nacl], and Diclofenac sodium  Review of Systems Review of Systems Pertinent findings noted in history of present illness.   Physical Exam Triage Vital Signs ED Triage Vitals  Enc Vitals Group     BP 08/29/21 0827 (!) 147/82     Pulse Rate 08/29/21 0827 72     Resp 08/29/21 0827 18     Temp 08/29/21 0827 98.3 F (36.8 C)     Temp Source 08/29/21 0827 Oral     SpO2 08/29/21 0827 98 %     Weight --      Height --      Head  Circumference --      Peak Flow --      Pain Score 08/29/21 0826 5     Pain Loc --      Pain Edu? --      Excl. in [place]? --   No data found.  Updated Vital Signs BP (!) 181/84 Comment: Provider aware   Pulse 67    Temp 98.6 F (37 C) (Oral)    Resp 20    LMP 11/02/2002 (Approximate)    SpO2 98%   Physical Exam Vitals and nursing note reviewed.  Constitutional:      General: She is not in acute distress.    Appearance: Normal appearance. She is not ill-appearing.  HENT:     Head: Normocephalic and atraumatic.     Salivary Glands: Right salivary gland is not diffusely enlarged or tender. Left salivary gland is not diffusely enlarged or tender.     Right Ear: Tympanic membrane, ear canal and external ear normal. No drainage. No middle ear effusion. There is no impacted cerumen. Tympanic membrane is not erythematous or bulging.     Left Ear: Tympanic membrane, ear canal and external ear normal. No drainage.  No middle ear effusion. There is no impacted cerumen. Tympanic membrane is not erythematous or bulging.     Nose: Nose normal. No nasal deformity, septal deviation, mucosal edema, congestion or rhinorrhea.     Right Turbinates: Not enlarged, swollen or pale.     Left Turbinates: Not enlarged, swollen or pale.     Right Sinus: No maxillary sinus tenderness or frontal sinus tenderness.     Left Sinus: No maxillary sinus tenderness or frontal sinus tenderness.     Mouth/Throat:     Lips: Pink. No lesions.     Mouth: Mucous membranes are moist. No oral lesions.     Pharynx: Oropharynx is clear. Uvula midline. No posterior oropharyngeal erythema or uvula swelling.     Tonsils: No tonsillar exudate. 0 on the right. 0 on the  left.  Eyes:     General: Lids are normal.        Right eye: No discharge.        Left eye: No discharge.     Extraocular Movements: Extraocular movements intact.     Conjunctiva/sclera: Conjunctivae normal.     Right eye: Right conjunctiva is not injected.     Left  eye: Left conjunctiva is not injected.  Neck:     Trachea: Trachea and phonation normal.  Cardiovascular:     Rate and Rhythm: Normal rate and regular rhythm.     Pulses: Normal pulses.     Heart sounds: Normal heart sounds. No murmur heard.   No friction rub. No gallop.  Pulmonary:     Effort: Pulmonary effort is normal. No accessory muscle usage, prolonged expiration or respiratory distress.     Breath sounds: Normal breath sounds. No stridor, decreased air movement or transmitted upper airway sounds. No decreased breath sounds, wheezing, rhonchi or rales.  Chest:     Chest wall: No tenderness.  Musculoskeletal:        General: Normal range of motion.     Cervical back: Normal range of motion and neck supple. Normal range of motion.  Lymphadenopathy:     Cervical: No cervical adenopathy.  Skin:    General: Skin is warm and dry.     Findings: No erythema or rash.  Neurological:     General: No focal deficit present.     Mental Status: She is alert and oriented to person, place, and time.  Psychiatric:        Mood and Affect: Mood normal.        Behavior: Behavior normal.    Visual Acuity Right Eye Distance:   Left Eye Distance:   Bilateral Distance:    Right Eye Near:   Left Eye Near:    Bilateral Near:     UC Couse / Diagnostics / Procedures:    EKG  Radiology No results found.  Procedures Procedures (including critical care time)  UC Diagnoses / Final Clinical Impressions(s)   I have reviewed the triage vital signs and the nursing notes.  Pertinent labs & imaging results that were available during my care of the patient were reviewed by me and considered in my medical decision making (see chart for details).   Final diagnoses:  Rhinosinusitis   COVID flu testing performed as patient requests.  Patient advised to continue all allergy medications.  Patient advised to follow-up with her allergist.  ED Prescriptions   None    PDMP not reviewed this  encounter.  Pending results:  Labs Reviewed  COVID-19, FLU A+B NAA    Medications Ordered in UC: Medications - No data to display  Disposition Upon Discharge:  Condition: stable for discharge home Home: take medications as prescribed; routine discharge instructions as discussed; follow up as advised.  Patient presented with an acute illness with associated systemic symptoms and significant discomfort requiring urgent management. In my opinion, this is a condition that a prudent lay person (someone who possesses an average knowledge of health and medicine) may potentially expect to result in complications if not addressed urgently such as respiratory distress, impairment of bodily function or dysfunction of bodily organs.   Routine symptom specific, illness specific and/or disease specific instructions were discussed with the patient and/or caregiver at length.   As such, the patient has been evaluated and assessed, work-up was performed and treatment was provided in alignment with urgent  care protocols and evidence based medicine.  Patient/parent/caregiver has been advised that the patient may require follow up for further testing and treatment if the symptoms continue in spite of treatment, as clinically indicated and appropriate.  If the patient was tested for COVID-19, Influenza and/or RSV, then the patient/parent/guardian was advised to isolate at home pending the results of his/her diagnostic coronavirus test and potentially longer if theyre positive. I have also advised pt that if his/her COVID-19 test returns positive, it's recommended to self-isolate for at least 10 days after symptoms first appeared AND until fever-free for 24 hours without fever reducer AND other symptoms have improved or resolved. Discussed self-isolation recommendations as well as instructions for household member/close contacts as per the [facility] and [place] DHHS, and also gave patient the [place] packet with this  information.  Patient/parent/caregiver has been advised to return to the [facility] or PCP in 3-5 days if no better; to PCP or the Emergency Department if new signs and symptoms develop, or if the current signs or symptoms continue to change or worsen for further workup, evaluation and treatment as clinically indicated and appropriate  The patient will follow up with their current PCP if and as advised. If the patient does not currently have a PCP we will assist them in obtaining one.   The patient may need specialty follow up if the symptoms continue, in spite of conservative treatment and management, for further workup, evaluation, consultation and treatment as clinically indicated and appropriate.  Patient/parent/caregiver verbalized understanding and agreement of plan as discussed.  All questions were addressed during visit.  Please see discharge instructions below for further details of plan.  Discharge Instructions:   Discharge Instructions      The results of your COVID and influenza test should be available in the next 24 to 48 hours.  Initially they will be posted to your MyChart account.  If either 1 is positive, you will be contacted by phone with further instructions.  Please continue Zyrtec, Flonase and olopatadine eyedrops for your symptoms.  Please follow-up with your allergist as planned.      This office note has been dictated using Museum/gallery curator.  Unfortunately, and despite my best efforts, this method of dictation can sometimes lead to occasional typographical or grammatical errors.  I apologize in advance if this occurs.     Lynden Oxford Scales, PA-C 11/11/21 1623

## 2021-11-11 NOTE — ED Triage Notes (Signed)
Pt reports having congestion and that began Sunday. At home covid test was negative.

## 2021-11-12 ENCOUNTER — Encounter: Payer: Self-pay | Admitting: Allergy and Immunology

## 2021-11-12 ENCOUNTER — Ambulatory Visit (INDEPENDENT_AMBULATORY_CARE_PROVIDER_SITE_OTHER): Payer: Medicare PPO | Admitting: Allergy and Immunology

## 2021-11-12 VITALS — BP 136/82 | HR 69 | Resp 16

## 2021-11-12 DIAGNOSIS — J3089 Other allergic rhinitis: Secondary | ICD-10-CM

## 2021-11-12 DIAGNOSIS — H6983 Other specified disorders of Eustachian tube, bilateral: Secondary | ICD-10-CM

## 2021-11-12 DIAGNOSIS — U071 COVID-19: Secondary | ICD-10-CM

## 2021-11-12 DIAGNOSIS — K219 Gastro-esophageal reflux disease without esophagitis: Secondary | ICD-10-CM

## 2021-11-12 MED ORDER — NIRMATRELVIR/RITONAVIR (PAXLOVID)TABLET: 3.0000 | ORAL_TABLET | Freq: Two times a day (BID) | ORAL | 0 refills | Status: AC
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
## 2021-11-12 NOTE — Patient Instructions (Addendum)
°  1.  Continue immunotherapy  2.  Continue Zyrtec 10 mg tablet 1 time per day if needed  3.  Continue OTC Nasacort 1-2 sprays each nostril 1 time per day if needed  4.  Continue Nexium 20 mg daily  5.  For this recent episode:   A. Paxlovid x 5 days  B. Prednisone 10 mg - 1 tablet 1 time per day for 5 days  C. Positive pressure inflation of ears  D. OTC Mucinex DM  E. No exposure to elderly, newborns, immunocompromised people  6.  Further evaluation and treatment?  7.  Return to clinic in 1 year or earlier if problem

## 2021-11-13 ENCOUNTER — Encounter: Payer: Self-pay | Admitting: Allergy and Immunology

## 2021-11-13 LAB — COVID-19, FLU A+B NAA
Influenza A, NAA: NOT DETECTED
Influenza B, NAA: NOT DETECTED
SARS-CoV-2, NAA: DETECTED — AB

## 2021-11-18 ENCOUNTER — Telehealth: Payer: Self-pay | Admitting: Allergy and Immunology

## 2021-11-18 NOTE — Telephone Encounter (Signed)
Patient states she is feeling much better today in regards to Covid. She states she took her last dose of Paxlovid yesterday but then in the afternoon she started experiencing pain in her left side near the kidney area. Patient states the pain got worse at night time. She drank a lot of water and she is feeling better but wanted to inform you of the situation and if this could be a side effect from taking Paxlovid. Please advice

## 2021-11-18 NOTE — Telephone Encounter (Signed)
Patient states she is feeling a lot better, started to feel better the day after she came in for an [place]. Yesterday, patient states she experienced some discomfort near her kidney area. She drank a lot of water and made it feel a little better. She is requesting a call from a nurse to go over this and has questions regarding her follow up note.

## 2021-11-26 ENCOUNTER — Telehealth: Payer: Self-pay | Admitting: Cardiology

## 2021-11-26 NOTE — Telephone Encounter (Signed)
°*  STAT* If patient is at the pharmacy, call can be transferred to refill team.   1. Which medications need to be refilled? (please list name of each medication and dose if known) Baxim 500 mg   2. Which pharmacy/location (including street and city if local pharmacy) is medication to be sent to? [id number] - [place], [place] - [address]  3. Do they need a 30 day or 90 day supply? 10 pills   Patient has deep cleaning at dentist next Wednesday 12/03/21. She normally has her dentist call in the antibiotic for her procedure but the dentist office has changed their protocol and she needs Dr. Raliegh Ip to write the rx now. She normally gets 10 pills so she can keep them in the refrigerator as they are good for a year  The patient is allergic to many antibiotics, so it is important that she gets this specific medication

## 2021-11-27 NOTE — Telephone Encounter (Signed)
Left message for patient to call back  

## 2021-11-28 NOTE — Telephone Encounter (Signed)
Patient returned call. She said she wasn't going through the "phone tree thing again, she wanted a direct number" if she can't answer her phone.

## 2021-11-28 NOTE — Telephone Encounter (Signed)
Spoke with pt and gave recommendation as per Dr. Wendy Poet note. Routed to Dr. Huston Foley.

## 2021-12-02 ENCOUNTER — Ambulatory Visit (INDEPENDENT_AMBULATORY_CARE_PROVIDER_SITE_OTHER): Payer: Medicare PPO

## 2021-12-02 DIAGNOSIS — J309 Allergic rhinitis, unspecified: Secondary | ICD-10-CM | POA: Diagnosis not present
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
## 2021-12-10 ENCOUNTER — Other Ambulatory Visit: Payer: Self-pay

## 2021-12-10 ENCOUNTER — Other Ambulatory Visit: Payer: Self-pay | Admitting: Oncology

## 2021-12-10 ENCOUNTER — Inpatient Hospital Stay: Payer: Medicare PPO | Attending: Oncology

## 2021-12-10 ENCOUNTER — Encounter: Payer: Self-pay | Admitting: Oncology

## 2021-12-10 ENCOUNTER — Inpatient Hospital Stay (INDEPENDENT_AMBULATORY_CARE_PROVIDER_SITE_OTHER): Payer: Medicare PPO | Admitting: Oncology

## 2021-12-10 ENCOUNTER — Other Ambulatory Visit: Payer: Self-pay | Admitting: Hematology and Oncology

## 2021-12-10 VITALS — BP 154/67 | HR 71 | Temp 98.1°F | Resp 18 | Ht 65.0 in | Wt 289.6 lb

## 2021-12-10 DIAGNOSIS — Z923 Personal history of irradiation: Secondary | ICD-10-CM | POA: Diagnosis not present

## 2021-12-10 DIAGNOSIS — Z79811 Long term (current) use of aromatase inhibitors: Secondary | ICD-10-CM | POA: Diagnosis not present

## 2021-12-10 DIAGNOSIS — Z170 Estrogen receptor positive status [ER+]: Secondary | ICD-10-CM | POA: Insufficient documentation

## 2021-12-10 DIAGNOSIS — E039 Hypothyroidism, unspecified: Secondary | ICD-10-CM | POA: Insufficient documentation

## 2021-12-10 DIAGNOSIS — C50411 Malignant neoplasm of upper-outer quadrant of right female breast: Secondary | ICD-10-CM

## 2021-12-10 DIAGNOSIS — E785 Hyperlipidemia, unspecified: Secondary | ICD-10-CM

## 2021-12-10 DIAGNOSIS — D649 Anemia, unspecified: Secondary | ICD-10-CM | POA: Diagnosis not present

## 2021-12-10 DIAGNOSIS — C50911 Malignant neoplasm of unspecified site of right female breast: Secondary | ICD-10-CM | POA: Insufficient documentation

## 2021-12-10 LAB — CBC AND DIFFERENTIAL
HCT: 38 (ref 36–46)
Hemoglobin: 12.9 (ref 12.0–16.0)
Neutrophils Absolute: 3.88
Platelets: 337 (ref 150–399)
WBC: 6.8

## 2021-12-10 LAB — BASIC METABOLIC PANEL
BUN: 18 (ref 4–21)
CO2: 18 (ref 13–22)
Chloride: 23 — AB (ref 99–108)
Creatinine: 0.7 (ref 0.5–1.1)
Glucose: 90
Potassium: 4.2 (ref 3.4–5.3)
Sodium: 139 (ref 137–147)

## 2021-12-10 LAB — LIPID PANEL
Cholesterol: 204 mg/dL — ABNORMAL HIGH (ref 0–200)
HDL: 49 mg/dL (ref >40)
LDL Cholesterol: 109 mg/dL — ABNORMAL HIGH (ref 0–99)
Total CHOL/HDL Ratio: 4.2 RATIO
Triglycerides: 232 mg/dL — ABNORMAL HIGH (ref <150)
VLDL: 46 mg/dL — ABNORMAL HIGH (ref 0–40)

## 2021-12-10 LAB — HEPATIC FUNCTION PANEL
ALT: 28 (ref 7–35)
AST: 30 (ref 13–35)
Alkaline Phosphatase: 100 (ref 25–125)
Bilirubin, Total: 0.4

## 2021-12-10 LAB — COMPREHENSIVE METABOLIC PANEL
Albumin: 4.3 (ref 3.5–5.0)
Calcium: 9.3 (ref 8.7–10.7)

## 2021-12-10 LAB — CBC
MCV: 87 (ref 81–99)
RBC: 4.37 (ref 3.87–5.11)

## 2021-12-10 LAB — TSH: TSH: 2.642 u[IU]/mL (ref 0.350–4.500)

## 2021-12-12 ENCOUNTER — Telehealth: Payer: Self-pay

## 2021-12-12 NOTE — Telephone Encounter (Signed)
-----   Message from Derwood Kaplan, MD sent at 12/12/2021  3:44 PM EST ----- Regarding: call Tell her cholesterol not bad, 204, better than it was years ago.  Thyroid looks good. Send copies of labs to Dr. Meredith Pel, Endocrine in [facility], and her PCP, anyone else she wishes

## 2021-12-12 NOTE — Telephone Encounter (Signed)
Patient notified and labs will be faxed to PCP, Dr. Meredith Pel and Dr. Agustin Cree per patient request.

## 2021-12-17 ENCOUNTER — Ambulatory Visit (INDEPENDENT_AMBULATORY_CARE_PROVIDER_SITE_OTHER): Payer: Medicare PPO | Admitting: *Deleted

## 2021-12-17 DIAGNOSIS — J309 Allergic rhinitis, unspecified: Secondary | ICD-10-CM | POA: Diagnosis not present

## 2022-01-01 ENCOUNTER — Ambulatory Visit (INDEPENDENT_AMBULATORY_CARE_PROVIDER_SITE_OTHER): Payer: Medicare PPO | Admitting: *Deleted

## 2022-01-01 DIAGNOSIS — J309 Allergic rhinitis, unspecified: Secondary | ICD-10-CM

## 2022-01-14 DIAGNOSIS — R7303 Prediabetes: Secondary | ICD-10-CM | POA: Diagnosis not present

## 2022-01-14 DIAGNOSIS — K760 Fatty (change of) liver, not elsewhere classified: Secondary | ICD-10-CM | POA: Diagnosis not present

## 2022-01-14 DIAGNOSIS — I10 Essential (primary) hypertension: Secondary | ICD-10-CM | POA: Diagnosis not present

## 2022-01-14 DIAGNOSIS — E049 Nontoxic goiter, unspecified: Secondary | ICD-10-CM | POA: Diagnosis not present

## 2022-01-14 DIAGNOSIS — C50411 Malignant neoplasm of upper-outer quadrant of right female breast: Secondary | ICD-10-CM | POA: Diagnosis not present

## 2022-01-14 DIAGNOSIS — G4733 Obstructive sleep apnea (adult) (pediatric): Secondary | ICD-10-CM | POA: Diagnosis not present

## 2022-01-14 DIAGNOSIS — Z6841 Body Mass Index (BMI) 40.0 and over, adult: Secondary | ICD-10-CM | POA: Diagnosis not present

## 2022-01-15 DIAGNOSIS — H40003 Preglaucoma, unspecified, bilateral: Secondary | ICD-10-CM | POA: Diagnosis not present

## 2022-01-15 DIAGNOSIS — H43813 Vitreous degeneration, bilateral: Secondary | ICD-10-CM | POA: Diagnosis not present

## 2022-01-15 DIAGNOSIS — H2513 Age-related nuclear cataract, bilateral: Secondary | ICD-10-CM | POA: Diagnosis not present

## 2022-01-20 ENCOUNTER — Ambulatory Visit (INDEPENDENT_AMBULATORY_CARE_PROVIDER_SITE_OTHER): Payer: Medicare PPO | Admitting: *Deleted

## 2022-01-20 DIAGNOSIS — J309 Allergic rhinitis, unspecified: Secondary | ICD-10-CM

## 2022-01-26 ENCOUNTER — Other Ambulatory Visit: Payer: Self-pay | Admitting: Cardiology

## 2022-01-27 DIAGNOSIS — L602 Onychogryphosis: Secondary | ICD-10-CM | POA: Diagnosis not present

## 2022-02-05 ENCOUNTER — Ambulatory Visit (INDEPENDENT_AMBULATORY_CARE_PROVIDER_SITE_OTHER): Payer: Medicare PPO | Admitting: *Deleted

## 2022-02-05 DIAGNOSIS — J309 Allergic rhinitis, unspecified: Secondary | ICD-10-CM

## 2022-02-10 DIAGNOSIS — Z03818 Encounter for observation for suspected exposure to other biological agents ruled out: Secondary | ICD-10-CM | POA: Diagnosis not present

## 2022-02-10 DIAGNOSIS — I10 Essential (primary) hypertension: Secondary | ICD-10-CM | POA: Diagnosis not present

## 2022-02-10 DIAGNOSIS — R519 Headache, unspecified: Secondary | ICD-10-CM | POA: Diagnosis not present

## 2022-02-10 DIAGNOSIS — J00 Acute nasopharyngitis [common cold]: Secondary | ICD-10-CM | POA: Diagnosis not present

## 2022-02-11 DIAGNOSIS — J029 Acute pharyngitis, unspecified: Secondary | ICD-10-CM | POA: Diagnosis not present

## 2022-02-11 DIAGNOSIS — I10 Essential (primary) hypertension: Secondary | ICD-10-CM | POA: Diagnosis not present

## 2022-02-11 DIAGNOSIS — R509 Fever, unspecified: Secondary | ICD-10-CM | POA: Diagnosis not present

## 2022-02-12 ENCOUNTER — Encounter: Payer: Self-pay | Admitting: Allergy and Immunology

## 2022-02-12 NOTE — Telephone Encounter (Signed)
Catherine [name] is probably infected again.  Odds are that it is a viral infection.  If it is persistent past 10 days then there is a problem.  I will be very happy to see her in the clinic if this is a persistent problem. ?

## 2022-02-18 ENCOUNTER — Encounter: Payer: Self-pay | Admitting: Allergy and Immunology

## 2022-02-18 ENCOUNTER — Ambulatory Visit (INDEPENDENT_AMBULATORY_CARE_PROVIDER_SITE_OTHER): Payer: Medicare PPO | Admitting: Allergy and Immunology

## 2022-02-18 VITALS — BP 140/82 | HR 70 | Resp 16

## 2022-02-18 DIAGNOSIS — K219 Gastro-esophageal reflux disease without esophagitis: Secondary | ICD-10-CM | POA: Diagnosis not present

## 2022-02-18 DIAGNOSIS — J069 Acute upper respiratory infection, unspecified: Secondary | ICD-10-CM | POA: Diagnosis not present

## 2022-02-18 DIAGNOSIS — J3089 Other allergic rhinitis: Secondary | ICD-10-CM | POA: Diagnosis not present

## 2022-02-18 NOTE — Patient Instructions (Addendum)
?  1.  For this recent episode: ? ? A. Nasal saline multiple times per day ? B. Prednisone 10 mg - 1 tablet 1 time per day for 5 days ? ?2.  Continue immunotherapy ? ?3.  Continue Nexium 20 mg daily ? ?4.  Continue Zyrtec 10 mg tablet 1 time per day if needed ? ?5.  Continue OTC Nasacort 1-2 sprays each nostril 1 time per day if needed ? ?6.  Return to clinic in 1 year or earlier if problem ?
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
## 2022-02-19 ENCOUNTER — Encounter: Payer: Self-pay | Admitting: Allergy and Immunology

## 2022-02-25 ENCOUNTER — Ambulatory Visit (INDEPENDENT_AMBULATORY_CARE_PROVIDER_SITE_OTHER): Payer: Medicare PPO | Admitting: *Deleted

## 2022-02-25 DIAGNOSIS — J309 Allergic rhinitis, unspecified: Secondary | ICD-10-CM

## 2022-03-04 DIAGNOSIS — J3089 Other allergic rhinitis: Secondary | ICD-10-CM | POA: Diagnosis not present

## 2022-03-04 NOTE — Progress Notes (Signed)
VIALS EXP 03-05-23 ?

## 2022-03-05 DIAGNOSIS — J302 Other seasonal allergic rhinitis: Secondary | ICD-10-CM | POA: Diagnosis not present

## 2022-03-12 ENCOUNTER — Encounter: Payer: Self-pay | Admitting: Allergy and Immunology

## 2022-03-12 ENCOUNTER — Ambulatory Visit (INDEPENDENT_AMBULATORY_CARE_PROVIDER_SITE_OTHER): Payer: Medicare PPO | Admitting: Allergy and Immunology

## 2022-03-12 VITALS — BP 148/62 | HR 60 | Resp 18

## 2022-03-12 DIAGNOSIS — J3089 Other allergic rhinitis: Secondary | ICD-10-CM | POA: Diagnosis not present

## 2022-03-12 DIAGNOSIS — K219 Gastro-esophageal reflux disease without esophagitis: Secondary | ICD-10-CM | POA: Diagnosis not present

## 2022-03-12 DIAGNOSIS — J0140 Acute pansinusitis, unspecified: Secondary | ICD-10-CM

## 2022-03-12 MED ORDER — CLINDAMYCIN HCL 150 MG PO CAPS
ORAL_CAPSULE | ORAL | 0 refills | Status: DC
Start: 2022-03-12 — End: 2023-01-12
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
## 2022-03-12 NOTE — Patient Instructions (Addendum)
?  1.  Continue nasal saline multiple times per day ? ?2.  Continue immunotherapy ? ?3.  Continue Nexium 20 mg daily ? ?4.  Continue Zyrtec 10 mg tablet 1 time per day if needed ? ?5.  Start clindamycin 150 mg - 1 tablet 3 times per day for 10 days ? ?6.  Evaluation with ENT for sinusitis ? ?7.  Return to clinic in 1 year or earlier if problem ?

## 2022-03-16 ENCOUNTER — Telehealth: Payer: Self-pay

## 2022-03-16 ENCOUNTER — Encounter: Payer: Self-pay | Admitting: Allergy and Immunology

## 2022-03-16 NOTE — Telephone Encounter (Signed)
Faxed referral request to [facility] for diagnosis of sinusitis.  Will follow-up next week. ?

## 2022-03-17 ENCOUNTER — Ambulatory Visit (INDEPENDENT_AMBULATORY_CARE_PROVIDER_SITE_OTHER): Payer: Medicare PPO | Admitting: *Deleted

## 2022-03-17 DIAGNOSIS — J309 Allergic rhinitis, unspecified: Secondary | ICD-10-CM | POA: Diagnosis not present

## 2022-03-31 ENCOUNTER — Ambulatory Visit (INDEPENDENT_AMBULATORY_CARE_PROVIDER_SITE_OTHER): Payer: Medicare PPO | Admitting: *Deleted

## 2022-03-31 DIAGNOSIS — J309 Allergic rhinitis, unspecified: Secondary | ICD-10-CM | POA: Diagnosis not present

## 2022-03-31 DIAGNOSIS — L820 Inflamed seborrheic keratosis: Secondary | ICD-10-CM | POA: Diagnosis not present

## 2022-04-01 NOTE — Telephone Encounter (Signed)
[facility] and they did not have record of the referral so I re-faxed referral.

## 2022-04-09 NOTE — Telephone Encounter (Signed)
Patient is scheduled for 06/15/2022.

## 2022-04-14 ENCOUNTER — Telehealth: Payer: Self-pay | Admitting: Cardiology

## 2022-04-14 MED ORDER — CANDESARTAN CILEXETIL 8 MG PO TABS
8.0000 mg | ORAL_TABLET | Freq: Every day | ORAL | 1 refills | Status: DC
Start: 2022-04-14 — End: 2022-10-09

## 2022-04-14 NOTE — Telephone Encounter (Signed)
*  STAT* If patient is at the pharmacy, call can be transferred to refill team.   1. Which medications need to be refilled? (please list name of each medication and dose if known) candesartan (ATACAND) 8 MG tablet  2. Which pharmacy/location (including street and city if local pharmacy) is medication to be sent to? [id number]- [place], [place] - [address] 3. Do they need a 30 day or 90 day supply? 90 day supply

## 2022-04-14 NOTE — Telephone Encounter (Signed)
Pt has appt with Dr. Agustin Cree 07-28-22. Ref Atacand #90 1 ref. Sent to pharmacy.

## 2022-04-15 ENCOUNTER — Ambulatory Visit (INDEPENDENT_AMBULATORY_CARE_PROVIDER_SITE_OTHER): Payer: Medicare PPO | Admitting: *Deleted

## 2022-04-15 DIAGNOSIS — J309 Allergic rhinitis, unspecified: Secondary | ICD-10-CM | POA: Diagnosis not present

## 2022-04-16 ENCOUNTER — Ambulatory Visit: Payer: Medicare PPO | Admitting: Cardiology

## 2022-04-24 ENCOUNTER — Other Ambulatory Visit: Payer: Self-pay | Admitting: Cardiology

## 2022-04-28 ENCOUNTER — Ambulatory Visit (INDEPENDENT_AMBULATORY_CARE_PROVIDER_SITE_OTHER): Payer: Medicare PPO | Admitting: *Deleted

## 2022-04-28 DIAGNOSIS — J309 Allergic rhinitis, unspecified: Secondary | ICD-10-CM

## 2022-05-06 ENCOUNTER — Ambulatory Visit (INDEPENDENT_AMBULATORY_CARE_PROVIDER_SITE_OTHER): Payer: Medicare PPO | Admitting: *Deleted

## 2022-05-06 DIAGNOSIS — J309 Allergic rhinitis, unspecified: Secondary | ICD-10-CM

## 2022-05-19 ENCOUNTER — Ambulatory Visit (INDEPENDENT_AMBULATORY_CARE_PROVIDER_SITE_OTHER): Payer: Medicare PPO | Admitting: *Deleted

## 2022-05-19 DIAGNOSIS — J309 Allergic rhinitis, unspecified: Secondary | ICD-10-CM

## 2022-05-21 ENCOUNTER — Other Ambulatory Visit: Payer: Self-pay | Admitting: Cardiology

## 2022-05-21 DIAGNOSIS — L602 Onychogryphosis: Secondary | ICD-10-CM | POA: Diagnosis not present

## 2022-06-02 ENCOUNTER — Ambulatory Visit (INDEPENDENT_AMBULATORY_CARE_PROVIDER_SITE_OTHER): Payer: Medicare PPO | Admitting: *Deleted

## 2022-06-02 DIAGNOSIS — J309 Allergic rhinitis, unspecified: Secondary | ICD-10-CM

## 2022-06-30 ENCOUNTER — Ambulatory Visit (INDEPENDENT_AMBULATORY_CARE_PROVIDER_SITE_OTHER): Payer: Medicare PPO | Admitting: *Deleted

## 2022-06-30 DIAGNOSIS — J309 Allergic rhinitis, unspecified: Secondary | ICD-10-CM

## 2022-07-01 ENCOUNTER — Ambulatory Visit: Payer: Medicare PPO | Admitting: Pulmonary Disease

## 2022-07-08 DIAGNOSIS — Z853 Personal history of malignant neoplasm of breast: Secondary | ICD-10-CM | POA: Diagnosis not present

## 2022-07-08 DIAGNOSIS — R928 Other abnormal and inconclusive findings on diagnostic imaging of breast: Secondary | ICD-10-CM | POA: Diagnosis not present

## 2022-07-08 DIAGNOSIS — C50411 Malignant neoplasm of upper-outer quadrant of right female breast: Secondary | ICD-10-CM | POA: Diagnosis not present
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
## 2022-07-14 ENCOUNTER — Other Ambulatory Visit: Payer: Self-pay | Admitting: Oncology

## 2022-07-14 ENCOUNTER — Encounter: Payer: Self-pay | Admitting: Oncology

## 2022-07-14 ENCOUNTER — Inpatient Hospital Stay: Payer: Medicare PPO | Attending: Oncology

## 2022-07-14 ENCOUNTER — Inpatient Hospital Stay (INDEPENDENT_AMBULATORY_CARE_PROVIDER_SITE_OTHER): Payer: Medicare PPO | Admitting: Oncology

## 2022-07-14 VITALS — BP 152/72 | HR 64 | Temp 97.9°F | Resp 16 | Ht 65.0 in | Wt 295.3 lb

## 2022-07-14 DIAGNOSIS — E049 Nontoxic goiter, unspecified: Secondary | ICD-10-CM

## 2022-07-14 DIAGNOSIS — Z170 Estrogen receptor positive status [ER+]: Secondary | ICD-10-CM

## 2022-07-14 DIAGNOSIS — D485 Neoplasm of uncertain behavior of skin: Secondary | ICD-10-CM | POA: Diagnosis not present

## 2022-07-14 DIAGNOSIS — T7840XA Allergy, unspecified, initial encounter: Secondary | ICD-10-CM | POA: Diagnosis not present

## 2022-07-14 DIAGNOSIS — L299 Pruritus, unspecified: Secondary | ICD-10-CM | POA: Diagnosis not present

## 2022-07-14 DIAGNOSIS — C50411 Malignant neoplasm of upper-outer quadrant of right female breast: Secondary | ICD-10-CM

## 2022-07-14 DIAGNOSIS — D649 Anemia, unspecified: Secondary | ICD-10-CM | POA: Diagnosis not present

## 2022-07-14 DIAGNOSIS — E039 Hypothyroidism, unspecified: Secondary | ICD-10-CM | POA: Insufficient documentation

## 2022-07-14 DIAGNOSIS — E785 Hyperlipidemia, unspecified: Secondary | ICD-10-CM

## 2022-07-14 DIAGNOSIS — M8580 Other specified disorders of bone density and structure, unspecified site: Secondary | ICD-10-CM

## 2022-07-14 LAB — LIPID PANEL
Cholesterol: 187 mg/dL (ref 0–200)
HDL: 40 mg/dL — ABNORMAL LOW (ref >40)
LDL Cholesterol: 95 mg/dL (ref 0–99)
Total CHOL/HDL Ratio: 4.7 RATIO
Triglycerides: 258 mg/dL — ABNORMAL HIGH (ref <150)
VLDL: 52 mg/dL — ABNORMAL HIGH (ref 0–40)

## 2022-07-14 LAB — HEPATIC FUNCTION PANEL
ALT: 28 U/L (ref 7–35)
AST: 30 (ref 13–35)
Alkaline Phosphatase: 82 (ref 25–125)
Bilirubin, Total: 0.6

## 2022-07-14 LAB — BASIC METABOLIC PANEL
BUN: 17 (ref 4–21)
CO2: 24 — AB (ref 13–22)
Chloride: 106 (ref 99–108)
Creatinine: 0.7 (ref 0.5–1.1)
Glucose: 104
Potassium: 4.4 mEq/L (ref 3.5–5.1)
Sodium: 139 (ref 137–147)

## 2022-07-14 LAB — CBC: RBC: 4.2 (ref 3.87–5.11)

## 2022-07-14 LAB — COMPREHENSIVE METABOLIC PANEL
Albumin: 4.4 (ref 3.5–5.0)
Calcium: 9.6 (ref 8.7–10.7)

## 2022-07-14 LAB — TSH: TSH: 2.521 u[IU]/mL (ref 0.350–4.500)

## 2022-07-14 LAB — CBC AND DIFFERENTIAL
HCT: 37 (ref 36–46)
Hemoglobin: 12.3 (ref 12.0–16.0)
Neutrophils Absolute: 3.84
Platelets: 316 10*3/uL (ref 150–400)
WBC: 6.2

## 2022-07-15 ENCOUNTER — Encounter: Payer: Self-pay | Admitting: Pulmonary Disease

## 2022-07-15 ENCOUNTER — Other Ambulatory Visit: Payer: Self-pay | Admitting: Pharmacist

## 2022-07-15 ENCOUNTER — Ambulatory Visit (HOSPITAL_BASED_OUTPATIENT_CLINIC_OR_DEPARTMENT_OTHER): Payer: Medicare PPO | Admitting: Obstetrics & Gynecology

## 2022-07-15 ENCOUNTER — Ambulatory Visit: Payer: Medicare PPO | Admitting: Pulmonary Disease

## 2022-07-15 VITALS — BP 130/68 | HR 70 | Temp 98.0°F | Ht 65.0 in | Wt 297.2 lb

## 2022-07-15 DIAGNOSIS — G4733 Obstructive sleep apnea (adult) (pediatric): Secondary | ICD-10-CM

## 2022-07-15 DIAGNOSIS — Z23 Encounter for immunization: Secondary | ICD-10-CM | POA: Insufficient documentation

## 2022-07-15 LAB — T4: T4, Total: 9.8 ug/dL (ref 4.5–12.0)

## 2022-07-15 LAB — T3 UPTAKE: T3 Uptake Ratio: 22 % — ABNORMAL LOW (ref 24–39)
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
## 2022-07-15 NOTE — Patient Instructions (Signed)
Will arrange for new CPAP machine  Follow up in 4 months 

## 2022-07-16 ENCOUNTER — Other Ambulatory Visit: Payer: Medicare PPO

## 2022-07-16 ENCOUNTER — Ambulatory Visit: Payer: Medicare PPO | Admitting: Oncology

## 2022-07-17 DIAGNOSIS — I10 Essential (primary) hypertension: Secondary | ICD-10-CM | POA: Diagnosis not present

## 2022-07-17 DIAGNOSIS — R0981 Nasal congestion: Secondary | ICD-10-CM | POA: Diagnosis not present

## 2022-07-17 DIAGNOSIS — J3489 Other specified disorders of nose and nasal sinuses: Secondary | ICD-10-CM | POA: Diagnosis not present

## 2022-07-17 DIAGNOSIS — J309 Allergic rhinitis, unspecified: Secondary | ICD-10-CM | POA: Diagnosis not present

## 2022-07-17 DIAGNOSIS — J342 Deviated nasal septum: Secondary | ICD-10-CM | POA: Diagnosis not present

## 2022-07-17 DIAGNOSIS — J343 Hypertrophy of nasal turbinates: Secondary | ICD-10-CM | POA: Diagnosis not present

## 2022-07-17 DIAGNOSIS — J3089 Other allergic rhinitis: Secondary | ICD-10-CM | POA: Diagnosis not present

## 2022-07-17 DIAGNOSIS — J351 Hypertrophy of tonsils: Secondary | ICD-10-CM | POA: Diagnosis not present

## 2022-07-21 ENCOUNTER — Ambulatory Visit (INDEPENDENT_AMBULATORY_CARE_PROVIDER_SITE_OTHER): Payer: Medicare PPO

## 2022-07-21 DIAGNOSIS — J309 Allergic rhinitis, unspecified: Secondary | ICD-10-CM

## 2022-07-22 ENCOUNTER — Encounter (HOSPITAL_BASED_OUTPATIENT_CLINIC_OR_DEPARTMENT_OTHER): Payer: Self-pay | Admitting: Obstetrics & Gynecology

## 2022-07-22 ENCOUNTER — Ambulatory Visit (INDEPENDENT_AMBULATORY_CARE_PROVIDER_SITE_OTHER): Payer: Medicare PPO | Admitting: Obstetrics & Gynecology

## 2022-07-22 ENCOUNTER — Other Ambulatory Visit (INDEPENDENT_AMBULATORY_CARE_PROVIDER_SITE_OTHER): Payer: Medicare PPO

## 2022-07-22 ENCOUNTER — Other Ambulatory Visit (HOSPITAL_BASED_OUTPATIENT_CLINIC_OR_DEPARTMENT_OTHER): Payer: Self-pay | Admitting: Obstetrics & Gynecology

## 2022-07-22 VITALS — BP 177/63 | HR 68 | Ht 66.0 in | Wt 296.6 lb

## 2022-07-22 DIAGNOSIS — B009 Herpesviral infection, unspecified: Secondary | ICD-10-CM | POA: Diagnosis not present

## 2022-07-22 DIAGNOSIS — Z9189 Other specified personal risk factors, not elsewhere classified: Secondary | ICD-10-CM

## 2022-07-22 DIAGNOSIS — K069 Disorder of gingiva and edentulous alveolar ridge, unspecified: Secondary | ICD-10-CM

## 2022-07-22 DIAGNOSIS — G4733 Obstructive sleep apnea (adult) (pediatric): Secondary | ICD-10-CM | POA: Diagnosis not present

## 2022-07-22 DIAGNOSIS — E042 Nontoxic multinodular goiter: Secondary | ICD-10-CM | POA: Diagnosis not present

## 2022-07-22 DIAGNOSIS — E559 Vitamin D deficiency, unspecified: Secondary | ICD-10-CM | POA: Diagnosis not present

## 2022-07-22 DIAGNOSIS — C50411 Malignant neoplasm of upper-outer quadrant of right female breast: Secondary | ICD-10-CM

## 2022-07-22 DIAGNOSIS — K760 Fatty (change of) liver, not elsewhere classified: Secondary | ICD-10-CM | POA: Diagnosis not present

## 2022-07-22 DIAGNOSIS — Z170 Estrogen receptor positive status [ER+]: Secondary | ICD-10-CM | POA: Diagnosis not present

## 2022-07-22 DIAGNOSIS — N83201 Unspecified ovarian cyst, right side: Secondary | ICD-10-CM

## 2022-07-22 DIAGNOSIS — N83202 Unspecified ovarian cyst, left side: Secondary | ICD-10-CM

## 2022-07-22 DIAGNOSIS — I10 Essential (primary) hypertension: Secondary | ICD-10-CM | POA: Diagnosis not present

## 2022-07-22 DIAGNOSIS — M8580 Other specified disorders of bone density and structure, unspecified site: Secondary | ICD-10-CM

## 2022-07-22 DIAGNOSIS — Z780 Asymptomatic menopausal state: Secondary | ICD-10-CM

## 2022-07-22 DIAGNOSIS — Z6841 Body Mass Index (BMI) 40.0 and over, adult: Secondary | ICD-10-CM | POA: Diagnosis not present

## 2022-07-22 MED ORDER — VALACYCLOVIR HCL 1 G PO TABS: ORAL_TABLET | ORAL | 1 refills | Status: DC
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
## 2022-07-25 ENCOUNTER — Other Ambulatory Visit: Payer: Self-pay | Admitting: Cardiology

## 2022-07-28 ENCOUNTER — Ambulatory Visit: Payer: Medicare PPO | Attending: Cardiology | Admitting: Cardiology

## 2022-07-28 ENCOUNTER — Encounter: Payer: Self-pay | Admitting: Cardiology

## 2022-07-28 VITALS — BP 138/62 | HR 76 | Ht 66.0 in | Wt 295.2 lb

## 2022-07-28 DIAGNOSIS — I059 Rheumatic mitral valve disease, unspecified: Secondary | ICD-10-CM

## 2022-07-28 DIAGNOSIS — G4733 Obstructive sleep apnea (adult) (pediatric): Secondary | ICD-10-CM

## 2022-07-28 DIAGNOSIS — E785 Hyperlipidemia, unspecified: Secondary | ICD-10-CM | POA: Diagnosis not present

## 2022-07-28 DIAGNOSIS — I272 Pulmonary hypertension, unspecified: Secondary | ICD-10-CM

## 2022-07-28 NOTE — Addendum Note (Signed)
Addended by: Jacobo Forest D on: 07/28/2022 02:47 PM   Modules accepted: Orders

## 2022-07-28 NOTE — Patient Instructions (Addendum)
Medication Instructions:  Your physician recommends that you continue on your current medications as directed. Please refer to the Current Medication list given to you today.  *If you need a refill on your cardiac medications before your next appointment, please call your pharmacy*   Lab Work: None Ordered If you have labs (blood work) drawn today and your tests are completely normal, you will receive your results only by: [place] (if you have MyChart) OR A paper copy in the mail If you have any lab test that is abnormal or we need to change your treatment, we will call you to review the results.   Testing/Procedures: Your physician has requested that you have an echocardiogram. Echocardiography is a painless test that uses sound waves to create images of your heart. It provides your doctor with information about the size and shape of your heart and how well your heart's chambers and valves are working. This procedure takes approximately one hour. There are no restrictions for this procedure.   We will order CT coronary calcium score. It will cost $99.00 and is not covered by insurance.  Please call to schedule.    [facility] [address] [place], [place] [address] [phone]  - Call to schedule   Follow-Up: At [facility] you and your health needs are our priority.  As part of our continuing mission to provide you with exceptional heart care, we have created designated Provider Care Teams.  These Care Teams include your primary Cardiologist (physician) and Advanced Practice Providers (APPs -  Physician Assistants and Nurse Practitioners) who all work together to provide you with the care you need, when you need it.  We recommend signing up for the patient portal called "MyChart".  Sign up information is provided on this After Visit Summary.  MyChart is used to connect with patients for Virtual Visits (Telemedicine).  Patients are able to view lab/test  results, encounter notes, upcoming appointments, etc.  Non-urgent messages can be sent to your provider as well.   To learn more about what you can do with MyChart, go to [email or web address].    Your next appointment:   12 month(s)  The format for your next appointment:   In Person  Provider:   Jenne Campus, MD    Other Instructions NA

## 2022-07-28 NOTE — Progress Notes (Signed)
Cardiology Office Note:    Date:  07/28/2022   ID:  [place], [place] 02/05/1951, MRN [id number]  PCP:  Patient, No Pcp Per  Cardiologist:  Jenne Campus, MD    Referring MD: No ref. provider found   Chief Complaint  Patient presents with   Follow-up    History of Present Illness:    Catherine [name] is a 71 y.o. female with past medical history significant for essential hypertension, dyslipidemia, hypothyroidism, obstructive sleep apnea on CPAP, mild pulmonary hypertension with pulmonary pressures stable at 40 mmHg felt to be related to obstructive sleep apnea.  She is coming today to my office for follow-up overall she seems to be doing well but complain of having some knee pain.  She does have 5 dogs that she walks with them on the regular basis.  Denies have any cardiac complaints, no chest pain tightness squeezing pressure burning chest  Past Medical History:  Diagnosis Date   Adiposity 05/26/2014   Allergic rhinoconjunctivitis 06/28/2015   Allergies    grasses, dander, dust, etc, etc   Anxiety    situational   Arthritis    Avitaminosis D 05/26/2014   Breast cancer ([place])    De Quervain's tenosynovitis, left 11/08/2019   Elevation of level of transaminase or lactic acid dehydrogenase (LDH) 05/26/2014   Essential (primary) hypertension 05/26/2014   Family history of breast cancer    Family history of pancreatic cancer    Fatty infiltration of liver 05/26/2014   Fibromyalgia    Foot injury, left, initial encounter 05/17/2018   Genetic testing 08/01/2018   The Common Hereditary Cancer Panel offered by [facility] includes sequencing and/or deletion duplication testing of the following 55 genes: APC, ATM, AXIN2, BARD1, BLM, BMPR1A, BRCA1, BRCA2, BRIP1, BUB1B, CDH1, CDK4, CDKN2A, CEP57, CHEK2, CTNNA1, DICER1, ENG, EPCAM, GALNT12, GREM1, HOXB13, KIT, MEN1, MLH1, MLH3, MSH2, MSH3, MSH6, MUTYH, NBN, NF1, NTHL1, PALB2, PDGFRA, PMS2, POLD1, POLE, PTEN, RAD50,    GERD (gastroesophageal reflux  disease)    Heart murmur    "not sure"   HLD (hyperlipidemia) 05/26/2014   Hypertension    Laryngopharyngeal reflux 06/28/2015   Low back pain 05/26/2014   Malignant neoplasm of upper-outer quadrant of right breast in female, estrogen receptor positive ([place]) 08/18/2018   Mild pulmonary hypertension ([place]) 12/03/2015   Overview:  48 mmHg in August 2017  Formatting of this note might be different from the original. Overview:  48 mmHg in August 2017 Formatting of this note might be different from the original. 48 mmHg in August 2017   Mitral valve disease 05/26/2014   MVP (mitral valve prolapse)    Myalgia and myositis 05/26/2014   Nodular goiter, non-toxic 05/26/2014   Obesity    OSA (obstructive sleep apnea) 05/26/2014   Perimenopausal    Plantar fasciitis 05/26/2014   Pulmonary hypertension, primary ([place]) 05/24/2017   Most likely related to sleep apnea   Shingles    Sleep apnea    uses CPAP   Treatment-emergent central sleep apnea 06/30/2017    Past Surgical History:  Procedure Laterality Date   BREAST LUMPECTOMY WITH RADIOACTIVE SEED AND SENTINEL LYMPH NODE BIOPSY Right 08/11/2018   Procedure: RIGHT BREAST LUMPECTOMY WITH BRACKETED RADIOACTIVE SEED AND RIGHT SENTINEL LYMPH NODE BIOPSY;  Surgeon: Rolm Bookbinder, MD;  Location: [facility];  Service: General;  Laterality: Right;   KNEE SURGERY Right 06/1994    Current Medications: Current Meds  Medication Sig   anastrozole (ARIMIDEX) 1 MG tablet TAKE 1 TABLET BY MOUTH  EVERY DAY (Patient taking differently: Take 1 mg by mouth daily.)   calcium carbonate (OS-CAL - DOSED IN MG OF ELEMENTAL CALCIUM) 1250 (500 Ca) MG tablet Take 1 tablet by mouth daily.   candesartan (ATACAND) 8 MG tablet Take 1 tablet (8 mg total) by mouth daily.   cetirizine (ZYRTEC) 10 MG tablet Take 10 mg by mouth daily.   Cholecalciferol (VITAMIN D) 2000 units CAPS Take 2,000 Units by mouth daily.    clindamycin (CLEOCIN) 150 MG capsule Take one capsule by mouth three times  daily for ten days. (Patient taking differently: Take 150 mg by mouth 3 (three) times daily. Take one capsule by mouth three times daily for ten days.)   EPINEPHrine 0.3 mg/0.3 mL IJ SOAJ injection Use as directed for life-threatening allergic reaction. (Patient taking differently: Inject 0.3 mg into the muscle as needed for anaphylaxis. Use as directed for life-threatening allergic reaction.)   esomeprazole (NEXIUM) 20 MG capsule Take 20 mg by mouth daily at 12 noon.   ezetimibe (ZETIA) 10 MG tablet Take 1 tablet (10 mg total) by mouth daily. Patient must keep appointment for 07/28/22 for further refills. 3 rd/final attempt   guaifenesin (HUMIBID E) 400 MG TABS tablet Take 400 mg by mouth daily as needed (for cough/congestion).   icosapent Ethyl (VASCEPA) 1 g capsule TAKE 1 CAPSULE BY MOUTH THREE TIMES A DAY (Patient taking differently: Take 2 g by mouth in the morning, at noon, and at bedtime.)   Magnesium 400 MG TABS Take 400 mg by mouth daily.   meloxicam (MOBIC) 7.5 MG tablet Take 7.5 mg by mouth daily.   NASACORT ALLERGY 24HR 55 MCG/ACT AERO nasal inhaler Use one spray in each nostril once daily as directed. (Patient taking differently: Place 2 sprays into the nose daily. Use one spray in each nostril once daily as directed.)   NASAL SALINE NA Place 1 spray into the nose daily.   Olopatadine HCl 0.6 % SOLN Can use one to two sprays in each nostril one to two times daily if needed. (Patient taking differently: Place 2 sprays into both nostrils daily as needed (dryness). Can use one to two sprays in each nostril one to two times daily if needed.)   triamcinolone ointment (KENALOG) 0.1 % Apply 1 Application topically 2 (two) times daily.   valACYclovir (VALTREX) 1000 MG tablet Take 2 tabs and repeat in 12 hours. (Patient taking differently: Take 1,000 mg by mouth 2 (two) times daily. Take 2 tabs and repeat in 12 hours.)     Allergies:   Penicillin g, Zinacef [cefuroxime in sterile water],  Fenofibrate, Rosuvastatin, Tape, Augmentin [amoxicillin-pot clavulanate], Desipramine hcl, Iodine, Tetanus antitoxin, Tetanus toxoids, Tetanus-diphtheria toxoids td, Tetanus-diphtheria toxoids td, Avelox [moxifloxacin hcl in nacl], Diclofenac sodium, Diclofenac sodium, Moxifloxacin, and Sulfa antibiotics   Social History   Socioeconomic History   Marital status: Married    Spouse name: Not on file   Number of children: Not on file   Years of education: Not on file   Highest education level: Not on file  Occupational History   Not on file  Tobacco Use   Smoking status: Never   Smokeless tobacco: Never  Vaping Use   Vaping Use: Never used  Substance and Sexual Activity   Alcohol use: No   Drug use: No   Sexual activity: Yes    Birth control/protection: Post-menopausal  Other Topics Concern   Not on file  Social History Narrative   Not on file   Social  Determinants of Health   Financial Resource Strain: Not on file  Food Insecurity: Not on file  Transportation Needs: Not on file  Physical Activity: Not on file  Stress: Not on file  Social Connections: Not on file     Family History: The patient's family history includes Allergic rhinitis in her mother; Asthma in her maternal grandfather; Breast cancer (age of onset: 51) in an other family member; Cancer in her paternal grandmother; Diabetes in her mother; Heart attack in her father; Heart disease in her paternal aunt; Hypertension in her father and mother; Infertility in her sister; Kidney failure in her mother; Other in her cousin; Pancreatic cancer (age of onset: 64) in her maternal grandfather. ROS:   Please see the history of present illness.    All 14 point review of systems negative except as described per history of present illness  EKGs/Labs/Other Studies Reviewed:      Recent Labs: 07/14/2022: ALT 28; BUN 17; Creatinine 0.7; Hemoglobin 12.3; Platelets 316; Potassium 4.4; Sodium 139; TSH 2.521  Recent Lipid  Panel    Component Value Date/Time   CHOL 187 07/14/2022 1126   CHOL 185 04/17/2021 0821   TRIG 258 (H) 07/14/2022 1126   HDL 40 (L) 07/14/2022 1126   HDL 46 04/17/2021 0821   CHOLHDL 4.7 07/14/2022 1126   VLDL 52 (H) 07/14/2022 1126   LDLCALC 95 07/14/2022 1126   LDLCALC 108 (H) 04/17/2021 0821    Physical Exam:    VS:  BP 138/62 (BP Location: Left Arm, Patient Position: Sitting)   Pulse 76   Ht $R'5\' 6"'Cc$  (1.676 m)   Wt 295 lb 3.2 oz (133.9 kg)   LMP 11/02/2002 (Approximate)   SpO2 95%   BMI 47.65 kg/m     Wt Readings from Last 3 Encounters:  07/28/22 295 lb 3.2 oz (133.9 kg)  07/22/22 296 lb 9.6 oz (134.5 kg)  07/15/22 297 lb 3.2 oz (134.8 kg)     GEN:  Well nourished, well developed in no acute distress HEENT: Normal NECK: No JVD; No carotid bruits LYMPHATICS: No lymphadenopathy CARDIAC: RRR, no murmurs, no rubs, no gallops RESPIRATORY:  Clear to auscultation without rales, wheezing or rhonchi  ABDOMEN: Soft, non-tender, non-distended MUSCULOSKELETAL:  No edema; No deformity  SKIN: Warm and dry LOWER EXTREMITIES: no swelling NEUROLOGIC:  Alert and oriented x 3 PSYCHIATRIC:  Normal affect   ASSESSMENT:    1. Mild pulmonary hypertension ([place])   2. Mitral valve disease   3. OSA (obstructive sleep apnea)   4. Dyslipidemia    PLAN:    In order of problems listed above:  Dyslipidemia I did review her K PN which show me her LDL of 95 HDL 40 with triglycerides 258.  She is taking Zetia she is intolerant to statin I calculated 10 years predicted risk which is 12.5% which is intermediate and I think there is a value of doing coronary calcium score if coronary calcium score is elevated then consider known statin medication to reduce her blood pressure. Pulmonary hypertension denies have any worsening of shortness of breath.  But I think there is reasonable approach to repeat her echocardiogram to check pulmonary pressure which I will do Mitral valve disease.  Again we  will do echocardiogram to assess that. Obstructive sleep apnea she does use CPAP on the regular basis and is happy with the results of it   Medication Adjustments/Labs and Tests Ordered: Current medicines are reviewed at length with the patient today.  Concerns regarding  medicines are outlined above.  No orders of the defined types were placed in this encounter.  Medication changes: No orders of the defined types were placed in this encounter.   Signed, Park Liter, MD, [facility] 07/28/2022 2:30 PM    [place]

## 2022-07-29 ENCOUNTER — Telehealth (HOSPITAL_BASED_OUTPATIENT_CLINIC_OR_DEPARTMENT_OTHER): Payer: Self-pay

## 2022-07-30 ENCOUNTER — Encounter: Payer: Self-pay | Admitting: Oncology

## 2022-07-31 ENCOUNTER — Telehealth: Payer: Self-pay

## 2022-07-31 NOTE — Telephone Encounter (Signed)
Patient notified

## 2022-07-31 NOTE — Telephone Encounter (Signed)
-----   Message from Derwood Kaplan, MD sent at 07/27/2022  4:25 PM EDT ----- Regarding: call Tell her thyroid is okay, send copy to Dr. Alanson Aly, not in [facility], now I think is in [place]

## 2022-08-03 ENCOUNTER — Other Ambulatory Visit (HOSPITAL_BASED_OUTPATIENT_CLINIC_OR_DEPARTMENT_OTHER): Payer: Self-pay | Admitting: Obstetrics & Gynecology

## 2022-08-03 ENCOUNTER — Ambulatory Visit
Admission: RE | Admit: 2022-08-03 | Discharge: 2022-08-03 | Disposition: A | Discharge status: Home / Self Care | Payer: Medicare PPO | Source: Ambulatory Visit | Attending: Cardiology | Admitting: Cardiology

## 2022-08-03 DIAGNOSIS — E785 Hyperlipidemia, unspecified: Secondary | ICD-10-CM | POA: Insufficient documentation

## 2022-08-03 DIAGNOSIS — B009 Herpesviral infection, unspecified: Secondary | ICD-10-CM

## 2022-08-03 DIAGNOSIS — I272 Pulmonary hypertension, unspecified: Secondary | ICD-10-CM | POA: Insufficient documentation

## 2022-08-04 ENCOUNTER — Ambulatory Visit (INDEPENDENT_AMBULATORY_CARE_PROVIDER_SITE_OTHER): Payer: Medicare PPO

## 2022-08-04 DIAGNOSIS — I272 Pulmonary hypertension, unspecified: Secondary | ICD-10-CM

## 2022-08-04 LAB — ECHOCARDIOGRAM COMPLETE
Area-P 1/2: 3 cm2
S' Lateral: 3.5 cm

## 2022-08-06 ENCOUNTER — Telehealth: Payer: Self-pay | Admitting: Cardiology

## 2022-08-06 ENCOUNTER — Telehealth: Payer: Self-pay

## 2022-08-06 NOTE — Telephone Encounter (Signed)
Patient is returning CMA's call for her echo results.

## 2022-08-06 NOTE — Telephone Encounter (Signed)
Results reviewed with pt as per Dr. Krasowski's note.  Pt verbalized understanding and had no additional questions.   

## 2022-08-13 ENCOUNTER — Inpatient Hospital Stay: Payer: Medicare PPO | Attending: Oncology

## 2022-08-13 VITALS — BP 156/60 | HR 65 | Temp 98.1°F | Resp 18 | Ht 66.0 in | Wt 295.0 lb

## 2022-08-13 DIAGNOSIS — Z23 Encounter for immunization: Secondary | ICD-10-CM | POA: Diagnosis not present

## 2022-08-13 MED ORDER — INFLUENZA VAC SPLIT QUAD 0.5 ML IM SUSY
0.5000 mL | PREFILLED_SYRINGE | Freq: Once | INTRAMUSCULAR | Status: AC
  Administered 2022-08-13: 0.5 mL via INTRAMUSCULAR
  Filled 2022-08-13: qty 0.5

## 2022-08-13 NOTE — Patient Instructions (Signed)

## 2022-08-19 ENCOUNTER — Ambulatory Visit (INDEPENDENT_AMBULATORY_CARE_PROVIDER_SITE_OTHER): Payer: Medicare PPO

## 2022-08-19 DIAGNOSIS — J309 Allergic rhinitis, unspecified: Secondary | ICD-10-CM | POA: Diagnosis not present

## 2022-08-27 DIAGNOSIS — L602 Onychogryphosis: Secondary | ICD-10-CM | POA: Diagnosis not present

## 2022-09-09 ENCOUNTER — Ambulatory Visit (INDEPENDENT_AMBULATORY_CARE_PROVIDER_SITE_OTHER): Payer: Medicare PPO

## 2022-09-09 DIAGNOSIS — J309 Allergic rhinitis, unspecified: Secondary | ICD-10-CM | POA: Diagnosis not present

## 2022-09-15 DIAGNOSIS — J1189 Influenza due to unidentified influenza virus with other manifestations: Secondary | ICD-10-CM | POA: Diagnosis not present

## 2022-09-16 ENCOUNTER — Encounter: Payer: Self-pay | Admitting: Allergy and Immunology

## 2022-09-16 ENCOUNTER — Ambulatory Visit: Payer: Medicare PPO | Admitting: Allergy and Immunology

## 2022-09-16 VITALS — BP 140/82 | HR 66 | Resp 16

## 2022-09-16 DIAGNOSIS — K219 Gastro-esophageal reflux disease without esophagitis: Secondary | ICD-10-CM

## 2022-09-16 MED ORDER — FAMOTIDINE 40 MG PO TABS: ORAL_TABLET | ORAL | 5 refills | Status: DC

## 2022-09-16 NOTE — Progress Notes (Unsigned)
Catherine [name] presents to this clinic to discuss her Nexium use.  She questions whether or not Nexium is responsible for her very significant muscle cramping that does not appear to be responsive to the ministration of magnesium or potassium.  I made the recommendation that she try famotidine 40 mg tablet-1/2-1 tablet to replace her Nexium.

## 2022-09-17 ENCOUNTER — Encounter: Payer: Self-pay | Admitting: Allergy and Immunology

## 2022-09-29 ENCOUNTER — Ambulatory Visit (INDEPENDENT_AMBULATORY_CARE_PROVIDER_SITE_OTHER): Payer: Medicare PPO

## 2022-09-29 DIAGNOSIS — J309 Allergic rhinitis, unspecified: Secondary | ICD-10-CM | POA: Diagnosis not present

## 2022-09-30 DIAGNOSIS — M8589 Other specified disorders of bone density and structure, multiple sites: Secondary | ICD-10-CM | POA: Diagnosis not present

## 2022-10-06 DIAGNOSIS — D485 Neoplasm of uncertain behavior of skin: Secondary | ICD-10-CM | POA: Diagnosis not present

## 2022-10-08 ENCOUNTER — Encounter: Payer: Self-pay | Admitting: Oncology

## 2022-10-08 DIAGNOSIS — J3089 Other allergic rhinitis: Secondary | ICD-10-CM | POA: Diagnosis not present

## 2022-10-08 NOTE — Progress Notes (Signed)
EXP 10/09/23 

## 2022-10-09 ENCOUNTER — Other Ambulatory Visit: Payer: Self-pay | Admitting: Cardiology

## 2022-10-09 DIAGNOSIS — J302 Other seasonal allergic rhinitis: Secondary | ICD-10-CM | POA: Diagnosis not present

## 2022-10-19 DIAGNOSIS — H43813 Vitreous degeneration, bilateral: Secondary | ICD-10-CM | POA: Diagnosis not present

## 2022-10-19 DIAGNOSIS — H2513 Age-related nuclear cataract, bilateral: Secondary | ICD-10-CM | POA: Diagnosis not present

## 2022-10-19 DIAGNOSIS — H40013 Open angle with borderline findings, low risk, bilateral: Secondary | ICD-10-CM | POA: Diagnosis not present

## 2022-10-21 ENCOUNTER — Other Ambulatory Visit: Payer: Self-pay | Admitting: Hematology and Oncology

## 2022-10-21 ENCOUNTER — Ambulatory Visit (INDEPENDENT_AMBULATORY_CARE_PROVIDER_SITE_OTHER): Payer: Medicare PPO | Admitting: *Deleted

## 2022-10-21 DIAGNOSIS — J309 Allergic rhinitis, unspecified: Secondary | ICD-10-CM | POA: Diagnosis not present

## 2022-10-21 DIAGNOSIS — Z170 Estrogen receptor positive status [ER+]: Secondary | ICD-10-CM

## 2022-10-21 DIAGNOSIS — C50411 Malignant neoplasm of upper-outer quadrant of right female breast: Secondary | ICD-10-CM

## 2022-10-22 ENCOUNTER — Encounter: Payer: Self-pay | Admitting: Oncology

## 2022-11-09 ENCOUNTER — Encounter: Payer: Self-pay | Admitting: Allergy and Immunology

## 2022-11-09 ENCOUNTER — Ambulatory Visit (INDEPENDENT_AMBULATORY_CARE_PROVIDER_SITE_OTHER): Payer: Medicare PPO | Admitting: Allergy and Immunology

## 2022-11-09 VITALS — BP 156/92 | HR 76 | Resp 16

## 2022-11-09 DIAGNOSIS — Z889 Allergy status to unspecified drugs, medicaments and biological substances status: Secondary | ICD-10-CM | POA: Diagnosis not present

## 2022-11-09 DIAGNOSIS — J309 Allergic rhinitis, unspecified: Secondary | ICD-10-CM | POA: Diagnosis not present

## 2022-11-09 DIAGNOSIS — M5136 Other intervertebral disc degeneration, lumbar region: Secondary | ICD-10-CM | POA: Diagnosis not present

## 2022-11-09 DIAGNOSIS — R252 Cramp and spasm: Secondary | ICD-10-CM

## 2022-11-09 DIAGNOSIS — M545 Low back pain, unspecified: Secondary | ICD-10-CM | POA: Diagnosis not present

## 2022-11-09 DIAGNOSIS — K219 Gastro-esophageal reflux disease without esophagitis: Secondary | ICD-10-CM

## 2022-11-09 DIAGNOSIS — G8929 Other chronic pain: Secondary | ICD-10-CM

## 2022-11-09 DIAGNOSIS — T50905D Adverse effect of unspecified drugs, medicaments and biological substances, subsequent encounter: Secondary | ICD-10-CM

## 2022-11-09 NOTE — Patient Instructions (Signed)
  1.  Continue nasal saline multiple times per day  2.  Continue immunotherapy  3.  Continue Zyrtec 10 mg tablet 1 time per day if needed  4.  Try OTC Prevacid (lansoprazole) 1 time per day. Report back about muscle cramps and reflux control.  5. Obtain lumbar series X-Ray for back pain.  6. Obtain RSV vaccine  7. Further treatment???
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
## 2022-11-10 ENCOUNTER — Encounter: Payer: Self-pay | Admitting: Allergy and Immunology

## 2022-11-11 ENCOUNTER — Encounter: Payer: Self-pay | Admitting: *Deleted

## 2022-11-18 DIAGNOSIS — F419 Anxiety disorder, unspecified: Secondary | ICD-10-CM | POA: Diagnosis not present

## 2022-11-18 DIAGNOSIS — I10 Essential (primary) hypertension: Secondary | ICD-10-CM | POA: Diagnosis not present

## 2022-11-18 DIAGNOSIS — G4733 Obstructive sleep apnea (adult) (pediatric): Secondary | ICD-10-CM | POA: Diagnosis not present

## 2022-11-18 DIAGNOSIS — F32A Depression, unspecified: Secondary | ICD-10-CM | POA: Diagnosis not present

## 2022-11-23 DIAGNOSIS — I10 Essential (primary) hypertension: Secondary | ICD-10-CM | POA: Diagnosis not present

## 2022-11-23 DIAGNOSIS — G4733 Obstructive sleep apnea (adult) (pediatric): Secondary | ICD-10-CM | POA: Diagnosis not present

## 2022-11-26 ENCOUNTER — Ambulatory Visit (INDEPENDENT_AMBULATORY_CARE_PROVIDER_SITE_OTHER): Payer: Medicare PPO | Admitting: *Deleted

## 2022-11-26 DIAGNOSIS — J309 Allergic rhinitis, unspecified: Secondary | ICD-10-CM | POA: Diagnosis not present

## 2022-12-08 DIAGNOSIS — L602 Onychogryphosis: Secondary | ICD-10-CM | POA: Diagnosis not present

## 2022-12-09 DIAGNOSIS — K08109 Complete loss of teeth, unspecified cause, unspecified class: Secondary | ICD-10-CM | POA: Insufficient documentation

## 2022-12-16 ENCOUNTER — Ambulatory Visit (INDEPENDENT_AMBULATORY_CARE_PROVIDER_SITE_OTHER): Payer: Medicare PPO | Admitting: *Deleted

## 2022-12-16 DIAGNOSIS — J309 Allergic rhinitis, unspecified: Secondary | ICD-10-CM | POA: Diagnosis not present

## 2022-12-19 DIAGNOSIS — F419 Anxiety disorder, unspecified: Secondary | ICD-10-CM | POA: Diagnosis not present

## 2022-12-19 DIAGNOSIS — F32A Depression, unspecified: Secondary | ICD-10-CM | POA: Diagnosis not present

## 2022-12-19 DIAGNOSIS — G4733 Obstructive sleep apnea (adult) (pediatric): Secondary | ICD-10-CM | POA: Diagnosis not present

## 2022-12-19 DIAGNOSIS — I10 Essential (primary) hypertension: Secondary | ICD-10-CM | POA: Diagnosis not present

## 2022-12-22 ENCOUNTER — Other Ambulatory Visit: Payer: Self-pay | Admitting: Cardiology

## 2022-12-28 ENCOUNTER — Ambulatory Visit (INDEPENDENT_AMBULATORY_CARE_PROVIDER_SITE_OTHER): Payer: Medicare PPO | Admitting: *Deleted

## 2022-12-28 DIAGNOSIS — J309 Allergic rhinitis, unspecified: Secondary | ICD-10-CM

## 2023-01-11 ENCOUNTER — Other Ambulatory Visit: Payer: Self-pay | Admitting: Oncology

## 2023-01-11 DIAGNOSIS — Z170 Estrogen receptor positive status [ER+]: Secondary | ICD-10-CM

## 2023-01-12 ENCOUNTER — Encounter: Payer: Self-pay | Admitting: Oncology

## 2023-01-12 ENCOUNTER — Other Ambulatory Visit: Payer: Self-pay | Admitting: Oncology

## 2023-01-12 ENCOUNTER — Inpatient Hospital Stay: Payer: Medicare PPO | Attending: Oncology | Admitting: Oncology

## 2023-01-12 ENCOUNTER — Other Ambulatory Visit: Payer: Self-pay

## 2023-01-12 ENCOUNTER — Inpatient Hospital Stay: Payer: Medicare PPO

## 2023-01-12 VITALS — BP 144/78 | HR 63 | Temp 98.1°F | Resp 18 | Ht 66.0 in | Wt 293.9 lb

## 2023-01-12 DIAGNOSIS — M8589 Other specified disorders of bone density and structure, multiple sites: Secondary | ICD-10-CM | POA: Diagnosis not present

## 2023-01-12 DIAGNOSIS — Z170 Estrogen receptor positive status [ER+]: Secondary | ICD-10-CM | POA: Diagnosis not present

## 2023-01-12 DIAGNOSIS — C50411 Malignant neoplasm of upper-outer quadrant of right female breast: Secondary | ICD-10-CM

## 2023-01-12 DIAGNOSIS — Z780 Asymptomatic menopausal state: Secondary | ICD-10-CM

## 2023-01-12 DIAGNOSIS — M8580 Other specified disorders of bone density and structure, unspecified site: Secondary | ICD-10-CM | POA: Diagnosis not present

## 2023-01-12 DIAGNOSIS — E049 Nontoxic goiter, unspecified: Secondary | ICD-10-CM

## 2023-01-12 DIAGNOSIS — Z79811 Long term (current) use of aromatase inhibitors: Secondary | ICD-10-CM | POA: Insufficient documentation

## 2023-01-12 DIAGNOSIS — E039 Hypothyroidism, unspecified: Secondary | ICD-10-CM | POA: Diagnosis not present

## 2023-01-12 DIAGNOSIS — N83209 Unspecified ovarian cyst, unspecified side: Secondary | ICD-10-CM | POA: Diagnosis not present

## 2023-01-12 LAB — CMP (CANCER CENTER ONLY)
ALT: 19 U/L (ref 0–44)
AST: 21 U/L (ref 15–41)
Albumin: 4 g/dL (ref 3.5–5.0)
Alkaline Phosphatase: 67 U/L (ref 38–126)
Anion gap: 7 (ref 5–15)
BUN: 20 mg/dL (ref 8–23)
CO2: 25 mmol/L (ref 22–32)
Calcium: 8.9 mg/dL (ref 8.9–10.3)
Chloride: 106 mmol/L (ref 98–111)
Creatinine: 0.8 mg/dL (ref 0.44–1.00)
GFR, Estimated: >60 mL/min (ref >60)
Glucose, Bld: 95 mg/dL (ref 70–99)
Potassium: 3.9 mmol/L (ref 3.5–5.1)
Sodium: 138 mmol/L (ref 135–145)
Total Bilirubin: 0.4 mg/dL (ref 0.3–1.2)
Total Protein: 7.7 g/dL (ref 6.5–8.1)

## 2023-01-12 LAB — CBC WITH DIFFERENTIAL (CANCER CENTER ONLY)
Abs Immature Granulocytes: 0.02 10*3/uL (ref 0.00–0.07)
Basophils Absolute: 0 10*3/uL (ref 0.0–0.1)
Basophils Relative: 1 %
Eosinophils Absolute: 0.1 10*3/uL (ref 0.0–0.5)
Eosinophils Relative: 1 %
HCT: 38.4 % (ref 36.0–46.0)
Hemoglobin: 12.4 g/dL (ref 12.0–15.0)
Immature Granulocytes: 0 %
Lymphocytes Relative: 29 %
Lymphs Abs: 1.9 10*3/uL (ref 0.7–4.0)
MCH: 29.8 pg (ref 26.0–34.0)
MCHC: 32.3 g/dL (ref 30.0–36.0)
MCV: 92.3 fL (ref 80.0–100.0)
Monocytes Absolute: 0.5 10*3/uL (ref 0.1–1.0)
Monocytes Relative: 8 %
Neutro Abs: 4.1 10*3/uL (ref 1.7–7.7)
Neutrophils Relative %: 61 %
Platelet Count: 191 10*3/uL (ref 150–400)
RBC: 4.16 MIL/uL (ref 3.87–5.11)
RDW: 13.1 % (ref 11.5–15.5)
WBC Count: 6.6 10*3/uL (ref 4.0–10.5)
nRBC: 0 % (ref 0.0–0.2)
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
## 2023-01-13 ENCOUNTER — Ambulatory Visit (INDEPENDENT_AMBULATORY_CARE_PROVIDER_SITE_OTHER): Payer: Medicare PPO | Admitting: *Deleted

## 2023-01-13 DIAGNOSIS — J309 Allergic rhinitis, unspecified: Secondary | ICD-10-CM

## 2023-01-14 LAB — T3 UPTAKE: T3 Uptake Ratio: 23 % — ABNORMAL LOW (ref 24–39)

## 2023-01-14 LAB — T4: T4, Total: 7.9 ug/dL (ref 4.5–12.0)

## 2023-01-17 DIAGNOSIS — F419 Anxiety disorder, unspecified: Secondary | ICD-10-CM | POA: Diagnosis not present

## 2023-01-17 DIAGNOSIS — F32A Depression, unspecified: Secondary | ICD-10-CM | POA: Diagnosis not present

## 2023-01-17 DIAGNOSIS — I10 Essential (primary) hypertension: Secondary | ICD-10-CM | POA: Diagnosis not present

## 2023-01-17 DIAGNOSIS — G4733 Obstructive sleep apnea (adult) (pediatric): Secondary | ICD-10-CM | POA: Diagnosis not present

## 2023-01-19 LAB — VITAMIN D 1,25 DIHYDROXY
Vitamin D 1, 25 (OH)2 Total: 55 pg/mL
Vitamin D2 1, 25 (OH)2: <10 pg/mL
Vitamin D3 1, 25 (OH)2: 53 pg/mL

## 2023-01-20 DIAGNOSIS — E042 Nontoxic multinodular goiter: Secondary | ICD-10-CM | POA: Diagnosis not present

## 2023-01-20 DIAGNOSIS — E559 Vitamin D deficiency, unspecified: Secondary | ICD-10-CM | POA: Diagnosis not present

## 2023-01-20 DIAGNOSIS — G4733 Obstructive sleep apnea (adult) (pediatric): Secondary | ICD-10-CM | POA: Diagnosis not present

## 2023-01-20 DIAGNOSIS — E782 Mixed hyperlipidemia: Secondary | ICD-10-CM | POA: Diagnosis not present

## 2023-01-20 DIAGNOSIS — C50411 Malignant neoplasm of upper-outer quadrant of right female breast: Secondary | ICD-10-CM | POA: Diagnosis not present

## 2023-01-20 DIAGNOSIS — I10 Essential (primary) hypertension: Secondary | ICD-10-CM | POA: Diagnosis not present

## 2023-01-22 DIAGNOSIS — I10 Essential (primary) hypertension: Secondary | ICD-10-CM | POA: Diagnosis not present

## 2023-01-22 DIAGNOSIS — G4733 Obstructive sleep apnea (adult) (pediatric): Secondary | ICD-10-CM | POA: Diagnosis not present

## 2023-01-26 ENCOUNTER — Telehealth: Payer: Self-pay

## 2023-01-26 ENCOUNTER — Ambulatory Visit (INDEPENDENT_AMBULATORY_CARE_PROVIDER_SITE_OTHER): Payer: Medicare PPO

## 2023-01-26 DIAGNOSIS — J309 Allergic rhinitis, unspecified: Secondary | ICD-10-CM

## 2023-01-26 NOTE — Telephone Encounter (Signed)
Patient notified and labs faxed to Dr. Meredith Pel

## 2023-01-26 NOTE — Telephone Encounter (Signed)
-----   Message from Derwood Kaplan, MD sent at 01/24/2023  5:07 PM EDT ----- Regarding: call Tell her labs all look great, send copies to Dr. Alanson Aly, endocrinologist

## 2023-02-09 ENCOUNTER — Encounter: Payer: Self-pay | Admitting: Oncology

## 2023-02-10 ENCOUNTER — Ambulatory Visit (INDEPENDENT_AMBULATORY_CARE_PROVIDER_SITE_OTHER): Payer: Medicare PPO | Admitting: Allergy and Immunology

## 2023-02-10 ENCOUNTER — Encounter: Payer: Self-pay | Admitting: Allergy and Immunology

## 2023-02-10 VITALS — BP 128/68 | HR 60 | Resp 10 | Ht 64.5 in | Wt 294.0 lb

## 2023-02-10 DIAGNOSIS — K219 Gastro-esophageal reflux disease without esophagitis: Secondary | ICD-10-CM | POA: Diagnosis not present

## 2023-02-10 DIAGNOSIS — J3089 Other allergic rhinitis: Secondary | ICD-10-CM

## 2023-02-10 MED ORDER — ESOMEPRAZOLE MAGNESIUM 20 MG PO CPDR: DELAYED_RELEASE_CAPSULE | ORAL | 3 refills | Status: DC

## 2023-02-10 NOTE — Patient Instructions (Signed)
  1.  Continue immunotherapy  2.  Continue Zyrtec 10 mg 1 time per day & nasal saline if needed  3.  Try Nexium 20 mg 1 time per day. (Insurance coverage???)  4. Return to clinic in 1 year or earlier if problem
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
## 2023-02-11 ENCOUNTER — Encounter: Payer: Self-pay | Admitting: Allergy and Immunology

## 2023-02-15 ENCOUNTER — Encounter: Payer: Self-pay | Admitting: *Deleted

## 2023-02-17 DIAGNOSIS — G4733 Obstructive sleep apnea (adult) (pediatric): Secondary | ICD-10-CM | POA: Diagnosis not present

## 2023-02-17 DIAGNOSIS — F32A Depression, unspecified: Secondary | ICD-10-CM | POA: Diagnosis not present

## 2023-02-17 DIAGNOSIS — I10 Essential (primary) hypertension: Secondary | ICD-10-CM | POA: Diagnosis not present

## 2023-02-17 DIAGNOSIS — F419 Anxiety disorder, unspecified: Secondary | ICD-10-CM | POA: Diagnosis not present

## 2023-02-18 ENCOUNTER — Ambulatory Visit (INDEPENDENT_AMBULATORY_CARE_PROVIDER_SITE_OTHER): Payer: Medicare PPO | Admitting: *Deleted

## 2023-02-18 DIAGNOSIS — J309 Allergic rhinitis, unspecified: Secondary | ICD-10-CM | POA: Diagnosis not present

## 2023-02-27 DIAGNOSIS — G4733 Obstructive sleep apnea (adult) (pediatric): Secondary | ICD-10-CM | POA: Diagnosis not present

## 2023-02-27 DIAGNOSIS — I10 Essential (primary) hypertension: Secondary | ICD-10-CM | POA: Diagnosis not present

## 2023-03-11 ENCOUNTER — Ambulatory Visit (INDEPENDENT_AMBULATORY_CARE_PROVIDER_SITE_OTHER): Payer: Medicare PPO | Admitting: *Deleted

## 2023-03-11 DIAGNOSIS — J309 Allergic rhinitis, unspecified: Secondary | ICD-10-CM | POA: Diagnosis not present

## 2023-03-11 DIAGNOSIS — G4733 Obstructive sleep apnea (adult) (pediatric): Secondary | ICD-10-CM | POA: Diagnosis not present

## 2023-03-11 DIAGNOSIS — L602 Onychogryphosis: Secondary | ICD-10-CM | POA: Diagnosis not present

## 2023-03-18 ENCOUNTER — Other Ambulatory Visit: Payer: Self-pay | Admitting: Allergy and Immunology

## 2023-03-19 DIAGNOSIS — F32A Depression, unspecified: Secondary | ICD-10-CM | POA: Diagnosis not present

## 2023-03-19 DIAGNOSIS — I10 Essential (primary) hypertension: Secondary | ICD-10-CM | POA: Diagnosis not present

## 2023-03-19 DIAGNOSIS — F419 Anxiety disorder, unspecified: Secondary | ICD-10-CM | POA: Diagnosis not present

## 2023-03-19 DIAGNOSIS — G4733 Obstructive sleep apnea (adult) (pediatric): Secondary | ICD-10-CM | POA: Diagnosis not present

## 2023-03-30 ENCOUNTER — Other Ambulatory Visit: Payer: Self-pay | Admitting: Cardiology

## 2023-03-30 NOTE — Telephone Encounter (Signed)
Rx refill sent to pharmacy. 

## 2023-04-06 ENCOUNTER — Ambulatory Visit (INDEPENDENT_AMBULATORY_CARE_PROVIDER_SITE_OTHER): Payer: Medicare PPO

## 2023-04-06 DIAGNOSIS — J309 Allergic rhinitis, unspecified: Secondary | ICD-10-CM | POA: Diagnosis not present

## 2023-04-07 DIAGNOSIS — H40013 Open angle with borderline findings, low risk, bilateral: Secondary | ICD-10-CM | POA: Diagnosis not present

## 2023-04-12 ENCOUNTER — Encounter: Payer: Self-pay | Admitting: Allergy and Immunology

## 2023-04-12 ENCOUNTER — Ambulatory Visit (INDEPENDENT_AMBULATORY_CARE_PROVIDER_SITE_OTHER): Payer: Medicare PPO | Admitting: Allergy and Immunology

## 2023-04-12 ENCOUNTER — Other Ambulatory Visit: Payer: Self-pay

## 2023-04-12 VITALS — BP 132/86 | HR 75 | Temp 98.4°F | Resp 16 | Ht 63.39 in | Wt 296.2 lb

## 2023-04-12 DIAGNOSIS — H6993 Unspecified Eustachian tube disorder, bilateral: Secondary | ICD-10-CM

## 2023-04-12 DIAGNOSIS — K219 Gastro-esophageal reflux disease without esophagitis: Secondary | ICD-10-CM

## 2023-04-12 DIAGNOSIS — J3089 Other allergic rhinitis: Secondary | ICD-10-CM
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
## 2023-04-12 NOTE — Patient Instructions (Signed)
  1.  Continue immunotherapy  2.  Start Ryaltris - 2 sprays each nostril 1-2 times per day (replaces Nasacort)  3.  Continue Zyrtec 10 mg 1 time per day & nasal saline & positive pressure inflation of ears if needed  4.  Continue Nexium 20 mg 1 time per day.    5. Return to clinic in 1 year or earlier if problem

## 2023-04-13 ENCOUNTER — Encounter: Payer: Self-pay | Admitting: Allergy and Immunology

## 2023-04-13 ENCOUNTER — Other Ambulatory Visit: Payer: Self-pay

## 2023-04-13 MED ORDER — ESOMEPRAZOLE MAGNESIUM 20 MG PO CPDR: DELAYED_RELEASE_CAPSULE | ORAL | 3 refills | Status: AC

## 2023-04-13 MED ORDER — RYALTRIS 665-25 MCG/ACT NA SUSP: NASAL | 5 refills | Status: DC

## 2023-04-14 ENCOUNTER — Encounter: Payer: Self-pay | Admitting: Oncology

## 2023-04-14 ENCOUNTER — Other Ambulatory Visit (HOSPITAL_COMMUNITY): Payer: Self-pay

## 2023-04-14 ENCOUNTER — Telehealth: Payer: Self-pay

## 2023-04-14 NOTE — Telephone Encounter (Signed)
Patient Advocate Encounter   Received notification from [facility] that prior authorization is required for Ryaltris 665-25MCG/ACT suspension   Submitted: 04-14-2023 Key [id number]  Status is pending

## 2023-04-14 NOTE — Telephone Encounter (Signed)
Patient Advocate Encounter  Prior Authorization for Ryaltris 665-25MCG/ACT suspension has been approved through [place].  Key: [id number]    Effective: 04-14-2023 to 11-01-2023   Test claim shows a $64.00 co-pay for 30 day supply.  Insurance is stating that pharmacy is trying to process for 3 (three) days. So if a rejection is sent in from them, that is probably why.

## 2023-04-19 DIAGNOSIS — F32A Depression, unspecified: Secondary | ICD-10-CM | POA: Diagnosis not present

## 2023-04-19 DIAGNOSIS — F419 Anxiety disorder, unspecified: Secondary | ICD-10-CM | POA: Diagnosis not present

## 2023-04-19 DIAGNOSIS — G4733 Obstructive sleep apnea (adult) (pediatric): Secondary | ICD-10-CM | POA: Diagnosis not present

## 2023-04-19 DIAGNOSIS — I10 Essential (primary) hypertension: Secondary | ICD-10-CM | POA: Diagnosis not present

## 2023-04-26 DIAGNOSIS — J3089 Other allergic rhinitis: Secondary | ICD-10-CM | POA: Diagnosis not present

## 2023-04-26 NOTE — Progress Notes (Signed)
EXP 04/25/24 

## 2023-04-27 ENCOUNTER — Encounter (HOSPITAL_BASED_OUTPATIENT_CLINIC_OR_DEPARTMENT_OTHER): Payer: Self-pay | Admitting: Pulmonary Disease

## 2023-04-27 ENCOUNTER — Ambulatory Visit (HOSPITAL_BASED_OUTPATIENT_CLINIC_OR_DEPARTMENT_OTHER): Payer: Medicare PPO | Admitting: Pulmonary Disease

## 2023-04-27 VITALS — BP 106/80 | HR 66 | Temp 98.2°F | Ht 65.0 in | Wt 294.6 lb

## 2023-04-27 DIAGNOSIS — G4733 Obstructive sleep apnea (adult) (pediatric): Secondary | ICD-10-CM | POA: Diagnosis not present

## 2023-04-27 DIAGNOSIS — J302 Other seasonal allergic rhinitis: Secondary | ICD-10-CM | POA: Diagnosis not present

## 2023-04-27 NOTE — Patient Instructions (Signed)
Follow up in 1 year.
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
## 2023-04-28 ENCOUNTER — Ambulatory Visit (INDEPENDENT_AMBULATORY_CARE_PROVIDER_SITE_OTHER): Payer: Medicare PPO

## 2023-04-28 DIAGNOSIS — J309 Allergic rhinitis, unspecified: Secondary | ICD-10-CM | POA: Diagnosis not present

## 2023-05-19 DIAGNOSIS — G4733 Obstructive sleep apnea (adult) (pediatric): Secondary | ICD-10-CM | POA: Diagnosis not present

## 2023-05-19 DIAGNOSIS — F419 Anxiety disorder, unspecified: Secondary | ICD-10-CM | POA: Diagnosis not present

## 2023-05-19 DIAGNOSIS — I10 Essential (primary) hypertension: Secondary | ICD-10-CM | POA: Diagnosis not present

## 2023-05-19 DIAGNOSIS — F32A Depression, unspecified: Secondary | ICD-10-CM | POA: Diagnosis not present

## 2023-05-27 DIAGNOSIS — L300 Nummular dermatitis: Secondary | ICD-10-CM | POA: Diagnosis not present

## 2023-05-27 DIAGNOSIS — L739 Follicular disorder, unspecified: Secondary | ICD-10-CM | POA: Diagnosis not present

## 2023-05-27 DIAGNOSIS — L821 Other seborrheic keratosis: Secondary | ICD-10-CM | POA: Diagnosis not present

## 2023-05-27 DIAGNOSIS — L720 Epidermal cyst: Secondary | ICD-10-CM | POA: Diagnosis not present

## 2023-05-31 DIAGNOSIS — G4733 Obstructive sleep apnea (adult) (pediatric): Secondary | ICD-10-CM | POA: Diagnosis not present

## 2023-06-01 DIAGNOSIS — L602 Onychogryphosis: Secondary | ICD-10-CM | POA: Diagnosis not present

## 2023-06-03 ENCOUNTER — Ambulatory Visit (INDEPENDENT_AMBULATORY_CARE_PROVIDER_SITE_OTHER): Payer: Medicare PPO | Admitting: *Deleted

## 2023-06-03 DIAGNOSIS — J309 Allergic rhinitis, unspecified: Secondary | ICD-10-CM

## 2023-06-19 DIAGNOSIS — F419 Anxiety disorder, unspecified: Secondary | ICD-10-CM | POA: Diagnosis not present

## 2023-06-19 DIAGNOSIS — G4733 Obstructive sleep apnea (adult) (pediatric): Secondary | ICD-10-CM | POA: Diagnosis not present

## 2023-06-19 DIAGNOSIS — I10 Essential (primary) hypertension: Secondary | ICD-10-CM | POA: Diagnosis not present

## 2023-06-19 DIAGNOSIS — F32A Depression, unspecified: Secondary | ICD-10-CM | POA: Diagnosis not present

## 2023-06-24 ENCOUNTER — Other Ambulatory Visit: Payer: Self-pay | Admitting: Cardiology

## 2023-06-24 ENCOUNTER — Ambulatory Visit (INDEPENDENT_AMBULATORY_CARE_PROVIDER_SITE_OTHER): Payer: Medicare PPO | Admitting: *Deleted

## 2023-06-24 DIAGNOSIS — J309 Allergic rhinitis, unspecified: Secondary | ICD-10-CM | POA: Diagnosis not present

## 2023-06-24 MED ORDER — ICOSAPENT ETHYL 1 G PO CAPS: ORAL_CAPSULE | ORAL | 0 refills | Status: DC

## 2023-06-24 NOTE — Addendum Note (Signed)
Addended by: Heywood Bene on: 06/24/2023 10:19 AM   Modules accepted: Orders

## 2023-06-28 DIAGNOSIS — G4733 Obstructive sleep apnea (adult) (pediatric): Secondary | ICD-10-CM | POA: Diagnosis not present

## 2023-07-08 ENCOUNTER — Ambulatory Visit (INDEPENDENT_AMBULATORY_CARE_PROVIDER_SITE_OTHER): Payer: Medicare PPO

## 2023-07-08 DIAGNOSIS — J309 Allergic rhinitis, unspecified: Secondary | ICD-10-CM | POA: Diagnosis not present

## 2023-07-13 DIAGNOSIS — Z853 Personal history of malignant neoplasm of breast: Secondary | ICD-10-CM | POA: Diagnosis not present

## 2023-07-13 DIAGNOSIS — Z1231 Encounter for screening mammogram for malignant neoplasm of breast: Secondary | ICD-10-CM | POA: Diagnosis not present

## 2023-07-13 LAB — HM MAMMOGRAPHY

## 2023-07-14 ENCOUNTER — Other Ambulatory Visit: Payer: Self-pay | Admitting: Oncology

## 2023-07-14 DIAGNOSIS — R921 Mammographic calcification found on diagnostic imaging of breast: Secondary | ICD-10-CM | POA: Diagnosis not present

## 2023-07-14 DIAGNOSIS — R928 Other abnormal and inconclusive findings on diagnostic imaging of breast: Secondary | ICD-10-CM | POA: Diagnosis not present

## 2023-07-14 DIAGNOSIS — R92322 Mammographic fibroglandular density, left breast: Secondary | ICD-10-CM | POA: Diagnosis not present

## 2023-07-14 DIAGNOSIS — N6325 Unspecified lump in the left breast, overlapping quadrants: Secondary | ICD-10-CM | POA: Diagnosis not present

## 2023-07-15 ENCOUNTER — Telehealth: Payer: Self-pay | Admitting: Oncology

## 2023-07-15 ENCOUNTER — Other Ambulatory Visit: Payer: Self-pay | Admitting: Oncology

## 2023-07-15 DIAGNOSIS — R921 Mammographic calcification found on diagnostic imaging of breast: Secondary | ICD-10-CM

## 2023-07-15 NOTE — Telephone Encounter (Signed)
Order faxed to [facility].    Scheduling Message Entered by Gery Pray H on 07/15/2023 at  1:41 PM Priority: High <No visit type provided>  Department: [facility]  Provider:  Scheduling Notes:  Order in for left breast bx for Monday 9/16, will still keep appt with me 9/13
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
## 2023-07-16 ENCOUNTER — Encounter: Payer: Self-pay | Admitting: Oncology

## 2023-07-16 ENCOUNTER — Inpatient Hospital Stay: Payer: Medicare PPO | Admitting: Oncology

## 2023-07-16 ENCOUNTER — Inpatient Hospital Stay: Payer: Medicare PPO | Attending: Oncology

## 2023-07-16 VITALS — BP 168/80 | HR 69 | Temp 97.9°F | Resp 16 | Ht 65.0 in | Wt 288.6 lb

## 2023-07-16 DIAGNOSIS — Z170 Estrogen receptor positive status [ER+]: Secondary | ICD-10-CM | POA: Insufficient documentation

## 2023-07-16 DIAGNOSIS — Z780 Asymptomatic menopausal state: Secondary | ICD-10-CM | POA: Diagnosis not present

## 2023-07-16 DIAGNOSIS — E039 Hypothyroidism, unspecified: Secondary | ICD-10-CM | POA: Diagnosis not present

## 2023-07-16 DIAGNOSIS — C50411 Malignant neoplasm of upper-outer quadrant of right female breast: Secondary | ICD-10-CM | POA: Insufficient documentation

## 2023-07-16 DIAGNOSIS — M8580 Other specified disorders of bone density and structure, unspecified site: Secondary | ICD-10-CM

## 2023-07-16 DIAGNOSIS — E049 Nontoxic goiter, unspecified: Secondary | ICD-10-CM

## 2023-07-16 LAB — CBC WITH DIFFERENTIAL (CANCER CENTER ONLY)
Abs Immature Granulocytes: 0.02 10*3/uL (ref 0.00–0.07)
Basophils Absolute: 0 10*3/uL (ref 0.0–0.1)
Basophils Relative: 1 %
Eosinophils Absolute: 0 10*3/uL (ref 0.0–0.5)
Eosinophils Relative: 1 %
HCT: 39.4 % (ref 36.0–46.0)
Hemoglobin: 12.7 g/dL (ref 12.0–15.0)
Immature Granulocytes: 0 %
Lymphocytes Relative: 22 %
Lymphs Abs: 1.5 10*3/uL (ref 0.7–4.0)
MCH: 29.4 pg (ref 26.0–34.0)
MCHC: 32.2 g/dL (ref 30.0–36.0)
MCV: 91.2 fL (ref 80.0–100.0)
Monocytes Absolute: 0.5 10*3/uL (ref 0.1–1.0)
Monocytes Relative: 7 %
Neutro Abs: 4.9 10*3/uL (ref 1.7–7.7)
Neutrophils Relative %: 69 %
Platelet Count: 162 10*3/uL (ref 150–400)
RBC: 4.32 MIL/uL (ref 3.87–5.11)
RDW: 13.2 % (ref 11.5–15.5)
WBC Count: 7 10*3/uL (ref 4.0–10.5)
nRBC: 0 % (ref 0.0–0.2)

## 2023-07-16 LAB — CMP (CANCER CENTER ONLY)
ALT: 23 U/L (ref 0–44)
AST: 24 U/L (ref 15–41)
Albumin: 3.9 g/dL (ref 3.5–5.0)
Alkaline Phosphatase: 72 U/L (ref 38–126)
Anion gap: 11 (ref 5–15)
BUN: 17 mg/dL (ref 8–23)
CO2: 24 mmol/L (ref 22–32)
Calcium: 9.2 mg/dL (ref 8.9–10.3)
Chloride: 104 mmol/L (ref 98–111)
Creatinine: 0.8 mg/dL (ref 0.44–1.00)
GFR, Estimated: >60 mL/min (ref >60)
Glucose, Bld: 96 mg/dL (ref 70–99)
Potassium: 4 mmol/L (ref 3.5–5.1)
Sodium: 139 mmol/L (ref 135–145)
Total Bilirubin: 0.5 mg/dL (ref 0.3–1.2)
Total Protein: 8.2 g/dL — ABNORMAL HIGH (ref 6.5–8.1)

## 2023-07-16 LAB — TSH: TSH: 2.323 u[IU]/mL (ref 0.350–4.500)

## 2023-07-17 LAB — T3 UPTAKE: T3 Uptake Ratio: 22 % — ABNORMAL LOW (ref 24–39)

## 2023-07-17 LAB — T4: T4, Total: 10.4 ug/dL (ref 4.5–12.0)

## 2023-07-19 DIAGNOSIS — C50812 Malignant neoplasm of overlapping sites of left female breast: Secondary | ICD-10-CM | POA: Diagnosis not present

## 2023-07-19 DIAGNOSIS — R928 Other abnormal and inconclusive findings on diagnostic imaging of breast: Secondary | ICD-10-CM | POA: Diagnosis not present

## 2023-07-19 DIAGNOSIS — N6325 Unspecified lump in the left breast, overlapping quadrants: Secondary | ICD-10-CM | POA: Diagnosis not present

## 2023-07-19 DIAGNOSIS — R599 Enlarged lymph nodes, unspecified: Secondary | ICD-10-CM | POA: Diagnosis not present

## 2023-07-20 ENCOUNTER — Ambulatory Visit: Payer: Medicare PPO | Admitting: Oncology

## 2023-07-20 ENCOUNTER — Other Ambulatory Visit: Payer: Medicare PPO

## 2023-07-20 DIAGNOSIS — I10 Essential (primary) hypertension: Secondary | ICD-10-CM | POA: Diagnosis not present

## 2023-07-20 DIAGNOSIS — G4733 Obstructive sleep apnea (adult) (pediatric): Secondary | ICD-10-CM | POA: Diagnosis not present

## 2023-07-20 DIAGNOSIS — F32A Depression, unspecified: Secondary | ICD-10-CM | POA: Diagnosis not present

## 2023-07-20 DIAGNOSIS — F419 Anxiety disorder, unspecified: Secondary | ICD-10-CM | POA: Diagnosis not present

## 2023-07-21 ENCOUNTER — Telehealth: Payer: Self-pay

## 2023-07-21 ENCOUNTER — Other Ambulatory Visit: Payer: Self-pay

## 2023-07-21 DIAGNOSIS — C50411 Malignant neoplasm of upper-outer quadrant of right female breast: Secondary | ICD-10-CM

## 2023-07-21 NOTE — Telephone Encounter (Signed)
Referral sent 07/21/23.

## 2023-07-21 NOTE — Telephone Encounter (Signed)
Catherine [name] with [facility] called to inform us that patient does have ductal carcinoma. Per Catherine [name] patient is aware of results. Patient would like to be sent to Dr. Dwain Sarna due to Dr. Dwain Sarna did prior surgery.

## 2023-07-26 ENCOUNTER — Other Ambulatory Visit: Payer: Self-pay | Admitting: General Surgery

## 2023-07-26 ENCOUNTER — Ambulatory Visit (HOSPITAL_BASED_OUTPATIENT_CLINIC_OR_DEPARTMENT_OTHER): Payer: Medicare PPO | Admitting: Obstetrics & Gynecology

## 2023-07-26 DIAGNOSIS — Z170 Estrogen receptor positive status [ER+]: Secondary | ICD-10-CM | POA: Diagnosis not present

## 2023-07-26 DIAGNOSIS — C50212 Malignant neoplasm of upper-inner quadrant of left female breast: Secondary | ICD-10-CM | POA: Diagnosis not present

## 2023-07-27 DIAGNOSIS — C50919 Malignant neoplasm of unspecified site of unspecified female breast: Secondary | ICD-10-CM | POA: Diagnosis not present

## 2023-07-27 DIAGNOSIS — E782 Mixed hyperlipidemia: Secondary | ICD-10-CM | POA: Diagnosis not present

## 2023-07-27 DIAGNOSIS — E042 Nontoxic multinodular goiter: Secondary | ICD-10-CM | POA: Diagnosis not present

## 2023-07-27 DIAGNOSIS — K760 Fatty (change of) liver, not elsewhere classified: Secondary | ICD-10-CM | POA: Diagnosis not present

## 2023-07-27 DIAGNOSIS — I10 Essential (primary) hypertension: Secondary | ICD-10-CM | POA: Diagnosis not present

## 2023-07-28 ENCOUNTER — Ambulatory Visit: Payer: Medicare PPO | Attending: Cardiology | Admitting: Cardiology

## 2023-07-28 ENCOUNTER — Encounter: Payer: Self-pay | Admitting: Cardiology

## 2023-07-28 VITALS — BP 130/72 | HR 71 | Ht 66.0 in | Wt 293.0 lb

## 2023-07-28 DIAGNOSIS — I10 Essential (primary) hypertension: Secondary | ICD-10-CM

## 2023-07-28 DIAGNOSIS — C50411 Malignant neoplasm of upper-outer quadrant of right female breast: Secondary | ICD-10-CM | POA: Diagnosis not present

## 2023-07-28 DIAGNOSIS — K219 Gastro-esophageal reflux disease without esophagitis: Secondary | ICD-10-CM | POA: Insufficient documentation

## 2023-07-28 DIAGNOSIS — Z170 Estrogen receptor positive status [ER+]: Secondary | ICD-10-CM | POA: Diagnosis not present

## 2023-07-28 DIAGNOSIS — I059 Rheumatic mitral valve disease, unspecified: Secondary | ICD-10-CM

## 2023-07-28 DIAGNOSIS — E785 Hyperlipidemia, unspecified: Secondary | ICD-10-CM

## 2023-07-28 DIAGNOSIS — I272 Pulmonary hypertension, unspecified: Secondary | ICD-10-CM

## 2023-07-28 NOTE — Addendum Note (Signed)
Addended by: Baldo Ash D on: 07/28/2023 01:59 PM   Modules accepted: Orders

## 2023-07-28 NOTE — Progress Notes (Signed)
Cardiology Office Note:    Date:  07/28/2023   ID:  Catherine [name] [place], [place] Mar 27, 1951, MRN [id number]  PCP:  Patient, No Pcp Per  Cardiologist:  Gypsy Balsam, MD    Referring MD: No ref. provider found   Chief Complaint  Patient presents with   Follow-up    History of Present Illness:    Catherine [name] is a 72 y.o. female with past medical history significant for essential hypertension, dyslipidemia, hypothyroidism, obstructive sleep apnea, mild pulmonary hypertension with pressure in the impaired of 40 mmHg probably related to obstructive sleep apnea.  Recently she was find to have some breast cancer and lumpectomy is contemplated.  She denies having any chest pain tightness squeezing pressure burning chest.  She she can walk the knee giving her problem but still quite good exercise tolerance.  Past Medical History:  Diagnosis Date   Adiposity 05/26/2014   Allergic rhinoconjunctivitis 06/28/2015   Allergies    grasses, dander, dust, etc, etc   Anxiety    situational   Arthritis    Avitaminosis D 05/26/2014   Breast cancer (HCC)    De Quervain's tenosynovitis, left 11/08/2019   Elevation of level of transaminase or lactic acid dehydrogenase (LDH) 05/26/2014   Essential (primary) hypertension 05/26/2014   Family history of breast cancer    Family history of pancreatic cancer    Fatty infiltration of liver 05/26/2014   Fibromyalgia    Foot injury, left, initial encounter 05/17/2018   Genetic testing 08/01/2018   The Common Hereditary Cancer Panel offered by [facility] includes sequencing and/or deletion duplication testing of the following 55 genes: APC, ATM, AXIN2, BARD1, BLM, BMPR1A, BRCA1, BRCA2, BRIP1, BUB1B, CDH1, CDK4, CDKN2A, CEP57, CHEK2, CTNNA1, DICER1, ENG, EPCAM, GALNT12, GREM1, HOXB13, KIT, MEN1, MLH1, MLH3, MSH2, MSH3, MSH6, MUTYH, NBN, NF1, NTHL1, PALB2, PDGFRA, PMS2, POLD1, POLE, PTEN, RAD50,    GERD (gastroesophageal reflux disease)    Heart murmur    "not sure"   HLD  (hyperlipidemia) 05/26/2014   Hypertension    Laryngopharyngeal reflux 06/28/2015   Low back pain 05/26/2014   Malignant neoplasm of upper-outer quadrant of right breast in female, estrogen receptor positive (HCC) 08/18/2018   Mild pulmonary hypertension (HCC) 12/03/2015   Overview:  48 mmHg in August 2017  Formatting of this note might be different from the original. Overview:  48 mmHg in August 2017 Formatting of this note might be different from the original. 48 mmHg in August 2017   Mitral valve disease 05/26/2014   MVP (mitral valve prolapse)    Myalgia and myositis 05/26/2014   Nodular goiter, non-toxic 05/26/2014   Obesity    OSA (obstructive sleep apnea) 05/26/2014   Perimenopausal    Plantar fasciitis 05/26/2014   Pulmonary hypertension, primary (HCC) 05/24/2017   Most likely related to sleep apnea   Shingles    Sleep apnea    uses CPAP   Treatment-emergent central sleep apnea 06/30/2017    Past Surgical History:  Procedure Laterality Date   BIOPSY BREAST Left    BREAST LUMPECTOMY WITH RADIOACTIVE SEED AND SENTINEL LYMPH NODE BIOPSY Right 08/11/2018   Procedure: RIGHT BREAST LUMPECTOMY WITH BRACKETED RADIOACTIVE SEED AND RIGHT SENTINEL LYMPH NODE BIOPSY;  Surgeon: Emelia Loron, MD;  Location: [facility] OR;  Service: General;  Laterality: Right;   KNEE SURGERY Right 06/02/1994    Current Medications: Current Meds  Medication Sig   anastrozole (ARIMIDEX) 1 MG tablet TAKE 1 TABLET BY MOUTH EVERY DAY (Patient taking differently: Take 1 mg  by mouth daily.)   calcium carbonate (OS-CAL - DOSED IN MG OF ELEMENTAL CALCIUM) 1250 (500 Ca) MG tablet Take 1 tablet by mouth daily.   candesartan (ATACAND) 8 MG tablet TAKE 1 TABLET BY MOUTH EVERY DAY   cetirizine (ZYRTEC) 10 MG tablet Take 10 mg by mouth daily.   Cholecalciferol (VITAMIN D) 2000 units CAPS Take 2,000 Units by mouth daily.    EPINEPHrine 0.3 mg/0.3 mL IJ SOAJ injection Use as directed for life-threatening allergic reaction.  (Patient taking differently: Inject 0.3 mg into the muscle as needed for anaphylaxis. Use as directed for life-threatening allergic reaction.)   esomeprazole (NEXIUM) 20 MG capsule Take one capsule by mouth once daily. (Patient taking differently: Take 20 mg by mouth daily at 12 noon. Take one capsule by mouth once daily.)   ezetimibe (ZETIA) 10 MG tablet TAKE 1 TABLET (10 MG TOTAL) BY MOUTH DAILY. PATIENT MUST KEEP APPOINTMENT FOR 07/28/22 FOR FURTHER REFILLS. 3 RD/FINAL ATTEMPT   guaifenesin (HUMIBID E) 400 MG TABS tablet Take 400 mg by mouth daily as needed (for cough/congestion).   icosapent Ethyl (VASCEPA) 1 g capsule 1 Capsule by mouth 3 times daily 1rst attempt, patient needs and appt for additional refills (Patient taking differently: Take 2 g by mouth 3 (three) times daily. 1 Capsule by mouth 3 times daily 1rst attempt, patient needs and appt for additional refills)   meloxicam (MOBIC) 7.5 MG tablet Take 7.5 mg by mouth daily.   NASACORT ALLERGY 24HR 55 MCG/ACT AERO nasal inhaler Use one spray in each nostril once daily as directed. (Patient taking differently: Place 2 sprays into the nose daily. Use one spray in each nostril once daily as directed.)   NASAL SALINE NA Place 1 spray into the nose daily.   Olopatadine HCl 0.6 % SOLN Can use one to two sprays in each nostril one to two times daily if needed. (Patient taking differently: Place 2 sprays into both nostrils daily as needed (dryness). Can use one to two sprays in each nostril one to two times daily if needed.)   Olopatadine-Mometasone (RYALTRIS) 665-25 MCG/ACT SUSP 2 sprays each nostril 1-2 times per day (Patient taking differently: Place 2 sprays into the nose See admin instructions. 2 sprays each nostril 1-2 times per day)   triamcinolone ointment (KENALOG) 0.1 % Apply 1 Application topically 2 (two) times daily.   valACYclovir (VALTREX) 1000 MG tablet Take 2 tabs and repeat in 12 hours. (Patient taking differently: Take 1,000 mg by  mouth 2 (two) times daily. Take 2 tabs and repeat in 12 hours.)   [DISCONTINUED] esomeprazole (NEXIUM) 20 MG capsule Take 20 mg by mouth daily.     Allergies:   Other, Penicillin g, Zinacef [cefuroxime in sterile water], Fenofibrate, Rosuvastatin, Tape, Augmentin [amoxicillin-pot clavulanate], Desipramine hcl, Silicone, Sulfasalazine, Tetanus antitoxin, Tetanus toxoids, Tetanus-diphtheria toxoids td, Tetanus-diphtheria toxoids td, Thimerosal and related, Avelox [moxifloxacin hcl in nacl], Diclofenac sodium, Diclofenac sodium, Iodine, Moxifloxacin, and Sulfa antibiotics   Social History   Socioeconomic History   Marital status: Married    Spouse name: Not on file   Number of children: Not on file   Years of education: Not on file   Highest education level: Not on file  Occupational History   Not on file  Tobacco Use   Smoking status: Never   Smokeless tobacco: Never  Vaping Use   Vaping status: Never Used  Substance and Sexual Activity   Alcohol use: No   Drug use: No   Sexual activity:  Yes    Birth control/protection: Post-menopausal  Other Topics Concern   Not on file  Social History Narrative   Not on file   Social Determinants of Health   Financial Resource Strain: Not on file  Food Insecurity: Not on file  Transportation Needs: Not on file  Physical Activity: Not on file  Stress: Not on file  Social Connections: Not on file     Family History: The patient's family history includes Allergic rhinitis in her mother; Asthma in her maternal grandfather; Breast cancer (age of onset: 60) in an other family member; Cancer in her paternal grandmother; Diabetes in her mother; Heart attack in her father; Heart disease in her paternal aunt; Hypertension in her father and mother; Infertility in her sister; Kidney failure in her mother; Other in her cousin; Pancreatic cancer (age of onset: 64) in her maternal grandfather. ROS:   Please see the history of present illness.    All 14  point review of systems negative except as described per history of present illness  EKGs/Labs/Other Studies Reviewed:    EKG Interpretation Date/Time:  Wednesday July 28 2023 13:24:20 EDT Ventricular Rate:  71 PR Interval:  176 QRS Duration:  72 QT Interval:  380 QTC Calculation: 412 R Axis:   -28  Text Interpretation: Normal sinus rhythm Normal ECG When compared with ECG of 08-Aug-2018 15:29, No significant change was found Confirmed by Gypsy Balsam [phone]) on 07/28/2023 1:37:17 PM    Recent Labs: 07/16/2023: ALT 23; BUN 17; Creatinine 0.80; Hemoglobin 12.7; Platelet Count 162; Potassium 4.0; Sodium 139; TSH 2.323  Recent Lipid Panel    Component Value Date/Time   CHOL 187 07/14/2022 1126   CHOL 185 04/17/2021 0821   TRIG 258 (H) 07/14/2022 1126   HDL 40 (L) 07/14/2022 1126   HDL 46 04/17/2021 0821   CHOLHDL 4.7 07/14/2022 1126   VLDL 52 (H) 07/14/2022 1126   LDLCALC 95 07/14/2022 1126   LDLCALC 108 (H) 04/17/2021 0821    Physical Exam:    VS:  BP 130/72 (BP Location: Left Arm, Patient Position: Sitting)   Pulse 71   Ht 5\' 6"  (1.676 m)   Wt 293 lb (132.9 kg)   LMP 11/02/2002 (Approximate)   SpO2 94%   BMI 47.29 kg/m     Wt Readings from Last 3 Encounters:  07/28/23 293 lb (132.9 kg)  07/16/23 288 lb 9.6 oz (130.9 kg)  04/27/23 294 lb 9.6 oz (133.6 kg)     GEN:  Well nourished, well developed in no acute distress HEENT: Normal NECK: No JVD; No carotid bruits LYMPHATICS: No lymphadenopathy CARDIAC: RRR, no murmurs, no rubs, no gallops RESPIRATORY:  Clear to auscultation without rales, wheezing or rhonchi  ABDOMEN: Soft, non-tender, non-distended MUSCULOSKELETAL:  No edema; No deformity  SKIN: Warm and dry LOWER EXTREMITIES: no swelling NEUROLOGIC:  Alert and oriented x 3 PSYCHIATRIC:  Normal affect   ASSESSMENT:    1. Mitral valve disease   2. Mild pulmonary hypertension (HCC)   3. Essential (primary) hypertension   4. Dyslipidemia   5.  Malignant neoplasm of upper-outer quadrant of right breast in female, estrogen receptor positive (HCC)    PLAN:    In order of problems listed above:  Mitral valve disease.  Will schedule her to have another echocardiogram to look at the valve. Mild pulmonary hypertension typically neighborhood of 40.  Again echocardiogram will be done. Essential hypertension blood pressure well-controlled continue present management. Dyslipidemia, I did review K PN which show  me her LDL of 95 HDL 40 with her calcium score being 0 its acceptable number she is taking only Zetia 10 mg daily. Cardiovascular preop evaluation for lumpectomy overall is considered very low risk surgery.  With her ability to exercise calcium score 0 I would I think we can proceed but first I will get echocardiogram to look at the valve   Medication Adjustments/Labs and Tests Ordered: Current medicines are reviewed at length with the patient today.  Concerns regarding medicines are outlined above.  Orders Placed This Encounter  Procedures   EKG 12-Lead   Medication changes: No orders of the defined types were placed in this encounter.   Signed, Georgeanna Lea, MD, [facility] 07/28/2023 1:47 PM    [facility]

## 2023-07-28 NOTE — Patient Instructions (Addendum)
Medication Instructions:  Your physician recommends that you continue on your current medications as directed. Please refer to the Current Medication list given to you today.  *If you need a refill on your cardiac medications before your next appointment, please call your pharmacy*   Lab Work: None Ordered If you have labs (blood work) drawn today and your tests are completely normal, you will receive your results only by: MyChart Message (if you have MyChart) OR A paper copy in the mail If you have any lab test that is abnormal or we need to change your treatment, we will call you to review the results.   Testing/Procedures: Your physician has requested that you have an echocardiogram. Echocardiography is a painless test that uses sound waves to create images of your heart. It provides your doctor with information about the size and shape of your heart and how well your heart's chambers and valves are working. This procedure takes approximately one hour. There are no restrictions for this procedure. Please do NOT wear cologne, perfume, aftershave, or lotions (deodorant is allowed). Please arrive 15 minutes prior to your appointment time.    Follow-Up: At [facility], you and your health needs are our priority.  As part of our continuing mission to provide you with exceptional heart care, we have created designated Provider Care Teams.  These Care Teams include your primary Cardiologist (physician) and Advanced Practice Providers (APPs -  Physician Assistants and Nurse Practitioners) who all work together to provide you with the care you need, when you need it.  We recommend signing up for the patient portal called "MyChart".  Sign up information is provided on this After Visit Summary.  MyChart is used to connect with patients for Virtual Visits (Telemedicine).  Patients are able to view lab/test results, encounter notes, upcoming appointments, etc.  Non-urgent messages can be sent to your  provider as well.   To learn more about what you can do with MyChart, go to [email or web address].    Your next appointment:   12 month(s)  The format for your next appointment:   In Person  Provider:   Gypsy Balsam, MD    Other Instructions NA

## 2023-07-29 ENCOUNTER — Ambulatory Visit (INDEPENDENT_AMBULATORY_CARE_PROVIDER_SITE_OTHER): Payer: Medicare PPO | Admitting: *Deleted

## 2023-07-29 ENCOUNTER — Other Ambulatory Visit: Payer: Self-pay | Admitting: General Surgery

## 2023-07-29 ENCOUNTER — Other Ambulatory Visit: Payer: Self-pay | Admitting: Hematology and Oncology

## 2023-07-29 DIAGNOSIS — C50411 Malignant neoplasm of upper-outer quadrant of right female breast: Secondary | ICD-10-CM

## 2023-07-29 DIAGNOSIS — J309 Allergic rhinitis, unspecified: Secondary | ICD-10-CM | POA: Diagnosis not present

## 2023-07-29 DIAGNOSIS — Z170 Estrogen receptor positive status [ER+]: Secondary | ICD-10-CM

## 2023-07-30 ENCOUNTER — Telehealth: Payer: Self-pay

## 2023-07-30 NOTE — Telephone Encounter (Signed)
Pathology with FISH results printed and placed in your box for your review.

## 2023-07-30 NOTE — Telephone Encounter (Signed)
-----   Message from Dellia Beckwith sent at 07/30/2023 12:41 PM EDT ----- See if FISH was done yet on the HER2 ----- Message ----- From: Emelia Loron, MD Sent: 07/26/2023   2:59 PM EDT To: Dellia Beckwith, MD  Thayer Ohm I saw her today.  Waiting on the her 2 but will assume its negative for this. We talked about lumpectomy.  She has a small er pos tumor and is now over 71. I would be fine based on Choosing Wisely and the SOUND data on omitting a sn on her. I assume you are going to do oncotype and then have her see rad onc.  Let me know if you are ok with that plan and I will get her scheduled. She said she would do whatever you wanted! Thanks, [employer]

## 2023-08-02 DIAGNOSIS — I361 Nonrheumatic tricuspid (valve) insufficiency: Secondary | ICD-10-CM | POA: Diagnosis not present

## 2023-08-02 DIAGNOSIS — R0600 Dyspnea, unspecified: Secondary | ICD-10-CM | POA: Diagnosis not present

## 2023-08-02 DIAGNOSIS — I342 Nonrheumatic mitral (valve) stenosis: Secondary | ICD-10-CM | POA: Diagnosis not present

## 2023-08-03 NOTE — Telephone Encounter (Signed)
Stopping this due to new breast cancer

## 2023-08-09 ENCOUNTER — Encounter (HOSPITAL_BASED_OUTPATIENT_CLINIC_OR_DEPARTMENT_OTHER): Payer: Self-pay | Admitting: General Surgery

## 2023-08-09 ENCOUNTER — Other Ambulatory Visit: Payer: Self-pay

## 2023-08-09 NOTE — Progress Notes (Signed)
   08/09/23 1131  Pre-op Phone Call  Surgery Date Verified 08/17/23  Arrival Time Verified 0845  Surgery Location Verified [facility] [place]  Medical History Reviewed Yes  Is the patient taking a GLP-1 receptor agonist? No  Does the patient have diabetes? No diagnosis of diabetes  Do you have a history of heart problems? Yes  Cardiologist Name Dr. Bing Matter  Have you ever had tests on your heart? Yes  What cardiac tests were performed? EKG;Echo  Results viewable: Care Everywhere  Does patient have other implanted devices? No  Patient Teaching Enhanced Recovery;Pre / Post Procedure  Patient educated about smoking cessation 24 hours prior to surgery. N/A Non-Smoker  Patient verbalizes understanding of bowel prep? N/A  THA/TKA patients only:  By your surgery date, will you have been taking narcotics for 90 days or greater? No  Med Rec Completed Yes  Take the Following Meds the Morning of Surgery nexium  Recent  Lab Work, EKG, CXR? Yes  NPO (Including gum & candy) After midnight  Allowed clear liquids Water;Gatorade  (diabetics please choose diet or no sugar options)  Patient instructed to stop clear liquids including Carb loading drink at: 0615  Stop Solids, Milk, Candy, and Gum STARTING AT MIDNIGHT  Did patient view EMMI videos? No  Responsible adult to drive and be with you for 24 hours? Yes  Name & Phone Number for Ride/Caregiver Husband Iantha Fallen  No Jewelry, money, nail polish or make-up.  No lotions, powders, perfumes. No shaving  48 hrs. prior to surgery. Yes  Contacts, Dentures & Glasses Will Have to be Removed Before OR. Yes  Please bring your ID and Insurance Card the morning of your surgery. (Surgery Centers Only) Yes  Bring any papers or x-rays with you that your surgeon gave you. Yes  Instructed to contact the location of procedure/ provider if they or anyone in their household develops symptoms or tests positive for COVID-19, has close contact with someone who tests positive for COVID,  or has known exposure to any contagious illness. Yes  Call this number the morning of surgery  with any problems that may cancel your surgery. [phone]  Covid-19 Assessment  Have you had a positive COVID-19 test within the previous 90 days? No  COVID Testing Guidance Proceed with the additional questions.  Patient's surgery required a COVID-19 test (cardiothoracic, complex ENT, and bronchoscopies/ EBUS) No  Have you been unmasked and in close contact with anyone with COVID-19 or COVID-19 symptoms within the past 10 days? No  Do you or anyone in your household currently have any COVID-19 symptoms? No

## 2023-08-10 ENCOUNTER — Encounter: Payer: Self-pay | Admitting: Oncology

## 2023-08-10 DIAGNOSIS — C50912 Malignant neoplasm of unspecified site of left female breast: Secondary | ICD-10-CM | POA: Insufficient documentation

## 2023-08-11 ENCOUNTER — Ambulatory Visit (HOSPITAL_BASED_OUTPATIENT_CLINIC_OR_DEPARTMENT_OTHER): Payer: Medicare PPO | Admitting: Obstetrics & Gynecology

## 2023-08-11 ENCOUNTER — Other Ambulatory Visit (HOSPITAL_BASED_OUTPATIENT_CLINIC_OR_DEPARTMENT_OTHER): Payer: Medicare PPO

## 2023-08-12 ENCOUNTER — Ambulatory Visit (INDEPENDENT_AMBULATORY_CARE_PROVIDER_SITE_OTHER): Payer: Medicare PPO

## 2023-08-12 DIAGNOSIS — L602 Onychogryphosis: Secondary | ICD-10-CM | POA: Diagnosis not present

## 2023-08-12 DIAGNOSIS — J309 Allergic rhinitis, unspecified: Secondary | ICD-10-CM

## 2023-08-16 ENCOUNTER — Ambulatory Visit
Admission: RE | Admit: 2023-08-16 | Discharge: 2023-08-16 | Disposition: A | Discharge status: Home / Self Care | Payer: Medicare PPO | Source: Ambulatory Visit | Attending: General Surgery | Admitting: General Surgery

## 2023-08-16 ENCOUNTER — Other Ambulatory Visit: Payer: Self-pay | Admitting: General Surgery

## 2023-08-16 DIAGNOSIS — N6489 Other specified disorders of breast: Secondary | ICD-10-CM | POA: Diagnosis not present

## 2023-08-16 DIAGNOSIS — C50411 Malignant neoplasm of upper-outer quadrant of right female breast: Secondary | ICD-10-CM

## 2023-08-16 HISTORY — PX: BREAST BIOPSY: SHX20

## 2023-08-16 NOTE — Anesthesia Preprocedure Evaluation (Signed)
Anesthesia Evaluation  Patient identified by MRN, date of birth, ID band Patient awake    Reviewed: Allergy & Precautions, NPO status , Patient's Chart, lab work & pertinent test results  Airway Mallampati: III  TM Distance: >3 FB Neck ROM: Full    Dental no notable dental hx. (+) Teeth Intact, Dental Advisory Given   Pulmonary sleep apnea and Continuous Positive Airway Pressure Ventilation    Pulmonary exam normal breath sounds clear to auscultation       Cardiovascular hypertension, Normal cardiovascular exam Rhythm:Regular Rate:Normal     Neuro/Psych   Anxiety        GI/Hepatic ,GERD  ,,  Endo/Other  46.6  Renal/GU      Musculoskeletal  (+)  Fibromyalgia -  Abdominal   Peds  Hematology   Anesthesia Other Findings All: see list  Reproductive/Obstetrics                              Anesthesia Physical Anesthesia Plan  ASA: 3  Anesthesia Plan: General   Post-op Pain Management: Precedex and Ofirmev IV (intra-op)*   Induction: Intravenous  PONV Risk Score and Plan: 3 and Propofol infusion, Treatment may vary due to age or medical condition, TIVA and Ondansetron  Airway Management Planned: LMA  Additional Equipment: None  Intra-op Plan:   Post-operative Plan:   Informed Consent: I have reviewed the patients History and Physical, chart, labs and discussed the procedure including the risks, benefits and alternatives for the proposed anesthesia with the patient or authorized representative who has indicated his/her understanding and acceptance.     Dental advisory given  Plan Discussed with: CRNA  Anesthesia Plan Comments:          Anesthesia Quick Evaluation

## 2023-08-16 NOTE — Progress Notes (Signed)
Gave patient CHG soap with instructions, patient verbalized understanding. Pt given Ensure and instructed to finish by 0615 morning of surgery. Pt verbalized understanding

## 2023-08-17 ENCOUNTER — Ambulatory Visit
Admission: RE | Admit: 2023-08-17 | Discharge: 2023-08-17 | Disposition: A | Discharge status: Home / Self Care | Payer: Medicare PPO | Source: Ambulatory Visit | Attending: General Surgery | Admitting: General Surgery

## 2023-08-17 ENCOUNTER — Ambulatory Visit (HOSPITAL_BASED_OUTPATIENT_CLINIC_OR_DEPARTMENT_OTHER)
Admission: RE | Admit: 2023-08-17 | Discharge: 2023-08-17 | Disposition: A | Discharge status: Home / Self Care | Payer: Medicare PPO | Attending: General Surgery | Admitting: General Surgery

## 2023-08-17 ENCOUNTER — Ambulatory Visit (HOSPITAL_BASED_OUTPATIENT_CLINIC_OR_DEPARTMENT_OTHER): Payer: Self-pay | Admitting: Anesthesiology

## 2023-08-17 ENCOUNTER — Other Ambulatory Visit: Payer: Self-pay

## 2023-08-17 ENCOUNTER — Encounter (HOSPITAL_BASED_OUTPATIENT_CLINIC_OR_DEPARTMENT_OTHER): Payer: Self-pay | Admitting: General Surgery

## 2023-08-17 ENCOUNTER — Encounter (HOSPITAL_BASED_OUTPATIENT_CLINIC_OR_DEPARTMENT_OTHER)
Admission: RE | Disposition: A | Discharge status: Home / Self Care | Payer: Self-pay | Source: Home / Self Care | Attending: General Surgery

## 2023-08-17 ENCOUNTER — Ambulatory Visit (HOSPITAL_BASED_OUTPATIENT_CLINIC_OR_DEPARTMENT_OTHER): Payer: Medicare PPO | Admitting: Anesthesiology

## 2023-08-17 DIAGNOSIS — Z170 Estrogen receptor positive status [ER+]: Secondary | ICD-10-CM | POA: Diagnosis not present

## 2023-08-17 DIAGNOSIS — G4733 Obstructive sleep apnea (adult) (pediatric): Secondary | ICD-10-CM | POA: Diagnosis not present

## 2023-08-17 DIAGNOSIS — C50212 Malignant neoplasm of upper-inner quadrant of left female breast: Secondary | ICD-10-CM | POA: Insufficient documentation

## 2023-08-17 DIAGNOSIS — Z1732 Human epidermal growth factor receptor 2 negative status: Secondary | ICD-10-CM | POA: Insufficient documentation

## 2023-08-17 DIAGNOSIS — Z1722 Progesterone receptor negative status: Secondary | ICD-10-CM | POA: Insufficient documentation

## 2023-08-17 DIAGNOSIS — C50912 Malignant neoplasm of unspecified site of left female breast: Secondary | ICD-10-CM | POA: Diagnosis not present

## 2023-08-17 DIAGNOSIS — N6489 Other specified disorders of breast: Secondary | ICD-10-CM | POA: Diagnosis not present

## 2023-08-17 HISTORY — PX: BREAST LUMPECTOMY WITH RADIOACTIVE SEED LOCALIZATION: SHX6424

## 2023-08-17 SURGERY — BREAST LUMPECTOMY WITH RADIOACTIVE SEED LOCALIZATION
Anesthesia: General | Site: Breast | Laterality: Left

## 2023-08-17 MED ORDER — ONDANSETRON HCL 4 MG/2ML IJ SOLN
INTRAMUSCULAR | Status: AC
  Filled 2023-08-17: qty 2

## 2023-08-17 MED ORDER — ENSURE PRE-SURGERY PO LIQD: 296.0000 mL | Freq: Once | ORAL | Status: DC

## 2023-08-17 MED ORDER — VANCOMYCIN HCL 1500 MG/300ML IV SOLN
1500.0000 mg | INTRAVENOUS | Status: AC
  Administered 2023-08-17: 1500 mg via INTRAVENOUS
  Filled 2023-08-17: qty 300

## 2023-08-17 MED ORDER — ROCURONIUM BROMIDE 10 MG/ML (PF) SYRINGE
PREFILLED_SYRINGE | INTRAVENOUS | Status: AC
  Filled 2023-08-17: qty 10

## 2023-08-17 MED ORDER — LIDOCAINE 2% (20 MG/ML) 5 ML SYRINGE
INTRAMUSCULAR | Status: AC
  Filled 2023-08-17: qty 5

## 2023-08-17 MED ORDER — DEXMEDETOMIDINE HCL IN NACL 80 MCG/20ML IV SOLN
INTRAVENOUS | Status: DC | PRN
Start: 2023-08-17 — End: 2023-08-17
  Administered 2023-08-17: 12 ug via INTRAVENOUS
  Administered 2023-08-17: 4 ug via INTRAVENOUS

## 2023-08-17 MED ORDER — CHLORHEXIDINE GLUCONATE CLOTH 2 % EX PADS: 6.0000 | MEDICATED_PAD | Freq: Once | CUTANEOUS | Status: DC

## 2023-08-17 MED ORDER — BUPIVACAINE HCL (PF) 0.25 % IJ SOLN
INTRAMUSCULAR | Status: DC | PRN
  Administered 2023-08-17: 10 mL

## 2023-08-17 MED ORDER — PROPOFOL 10 MG/ML IV BOLUS
INTRAVENOUS | Status: DC | PRN
  Administered 2023-08-17: 150 mg via INTRAVENOUS

## 2023-08-17 MED ORDER — PROPOFOL 10 MG/ML IV BOLUS
INTRAVENOUS | Status: AC
  Filled 2023-08-17: qty 20

## 2023-08-17 MED ORDER — ONDANSETRON HCL 4 MG/2ML IJ SOLN: 4.0000 mg | Freq: Once | INTRAMUSCULAR | Status: DC | PRN

## 2023-08-17 MED ORDER — DEXAMETHASONE SODIUM PHOSPHATE 10 MG/ML IJ SOLN
INTRAMUSCULAR | Status: DC | PRN
  Administered 2023-08-17: 5 mg via INTRAVENOUS

## 2023-08-17 MED ORDER — PROPOFOL 500 MG/50ML IV EMUL
INTRAVENOUS | Status: DC | PRN
Start: 2023-08-17 — End: 2023-08-17
  Administered 2023-08-17: 80 ug/kg/min via INTRAVENOUS

## 2023-08-17 MED ORDER — LACTATED RINGERS IV SOLN: INTRAVENOUS | Status: DC

## 2023-08-17 MED ORDER — VANCOMYCIN HCL 500 MG/100ML IV SOLN
INTRAVENOUS | Status: AC
  Filled 2023-08-17: qty 100

## 2023-08-17 MED ORDER — ACETAMINOPHEN 500 MG PO TABS
1000.0000 mg | ORAL_TABLET | ORAL | Status: AC
  Administered 2023-08-17: 1000 mg via ORAL

## 2023-08-17 MED ORDER — MIDAZOLAM HCL 2 MG/2ML IJ SOLN
INTRAMUSCULAR | Status: AC
  Filled 2023-08-17: qty 2

## 2023-08-17 MED ORDER — EPHEDRINE SULFATE (PRESSORS) 50 MG/ML IJ SOLN
INTRAMUSCULAR | Status: DC | PRN
  Administered 2023-08-17: 5 mg via INTRAVENOUS
  Administered 2023-08-17 (×2): 10 mg via INTRAVENOUS

## 2023-08-17 MED ORDER — DEXAMETHASONE SODIUM PHOSPHATE 10 MG/ML IJ SOLN
INTRAMUSCULAR | Status: AC
  Filled 2023-08-17: qty 1

## 2023-08-17 MED ORDER — FENTANYL CITRATE (PF) 100 MCG/2ML IJ SOLN
INTRAMUSCULAR | Status: AC
  Filled 2023-08-17: qty 2

## 2023-08-17 MED ORDER — LACTATED RINGERS IV SOLN: INTRAVENOUS | Status: DC | PRN

## 2023-08-17 MED ORDER — FENTANYL CITRATE (PF) 100 MCG/2ML IJ SOLN
INTRAMUSCULAR | Status: DC | PRN
  Administered 2023-08-17 (×2): 25 ug via INTRAVENOUS

## 2023-08-17 MED ORDER — ACETAMINOPHEN 500 MG PO TABS
ORAL_TABLET | ORAL | Status: AC
  Filled 2023-08-17: qty 2

## 2023-08-17 MED ORDER — LIDOCAINE HCL (CARDIAC) PF 100 MG/5ML IV SOSY
PREFILLED_SYRINGE | INTRAVENOUS | Status: DC | PRN
  Administered 2023-08-17: 100 mg via INTRAVENOUS

## 2023-08-17 MED ORDER — ACETAMINOPHEN 10 MG/ML IV SOLN: 1000.0000 mg | Freq: Once | INTRAVENOUS | Status: DC | PRN

## 2023-08-17 MED ORDER — FENTANYL CITRATE (PF) 100 MCG/2ML IJ SOLN: 25.0000 ug | INTRAMUSCULAR | Status: DC | PRN

## 2023-08-17 MED ORDER — VANCOMYCIN HCL IN DEXTROSE 1-5 GM/200ML-% IV SOLN
INTRAVENOUS | Status: AC
  Filled 2023-08-17: qty 200

## 2023-08-17 MED ORDER — ONDANSETRON HCL 4 MG/2ML IJ SOLN
INTRAMUSCULAR | Status: DC | PRN
  Administered 2023-08-17: 4 mg via INTRAVENOUS

## 2023-08-17 SURGICAL SUPPLY — 59 items
ADH SKN CLS APL DERMABOND .7 (GAUZE/BANDAGES/DRESSINGS) ×1
APL PRP STRL LF DISP 70% ISPRP (MISCELLANEOUS) ×1
APPLIER CLIP 9.375 MED OPEN (MISCELLANEOUS)
APR CLP MED 9.3 20 MLT OPN (MISCELLANEOUS)
BINDER BREAST 3XL (GAUZE/BANDAGES/DRESSINGS) IMPLANT
BINDER BREAST LRG (GAUZE/BANDAGES/DRESSINGS) IMPLANT
BINDER BREAST MEDIUM (GAUZE/BANDAGES/DRESSINGS) IMPLANT
BINDER BREAST XLRG (GAUZE/BANDAGES/DRESSINGS) IMPLANT
BINDER BREAST XXLRG (GAUZE/BANDAGES/DRESSINGS) IMPLANT
BLADE SURG 15 STRL LF DISP TIS (BLADE) ×2 IMPLANT
BLADE SURG 15 STRL SS (BLADE) ×1
CANISTER SUC SOCK COL 7IN (MISCELLANEOUS) IMPLANT
CANISTER SUCT 1200ML W/VALVE (MISCELLANEOUS) IMPLANT
CHLORAPREP W/TINT 26 (MISCELLANEOUS) ×2 IMPLANT
CLIP APPLIE 9.375 MED OPEN (MISCELLANEOUS) IMPLANT
CLIP TI WIDE RED SMALL 6 (CLIP) IMPLANT
COVER BACK TABLE 60X90IN (DRAPES) ×2 IMPLANT
COVER MAYO STAND STRL (DRAPES) ×2 IMPLANT
COVER PROBE CYLINDRICAL 5X96 (MISCELLANEOUS) ×2 IMPLANT
DERMABOND ADVANCED .7 DNX12 (GAUZE/BANDAGES/DRESSINGS) ×2 IMPLANT
DRAPE LAPAROSCOPIC ABDOMINAL (DRAPES) ×2 IMPLANT
DRAPE UTILITY XL STRL (DRAPES) ×2 IMPLANT
DRSG TEGADERM 4X4.75 (GAUZE/BANDAGES/DRESSINGS) IMPLANT
ELECT COATED BLADE 2.86 ST (ELECTRODE) ×2 IMPLANT
ELECT REM PT RETURN 9FT ADLT (ELECTROSURGICAL) ×1
ELECTRODE REM PT RTRN 9FT ADLT (ELECTROSURGICAL) ×2 IMPLANT
GAUZE SPONGE 4X4 12PLY STRL LF (GAUZE/BANDAGES/DRESSINGS) IMPLANT
GLOVE BIO SURGEON STRL SZ7 (GLOVE) ×4 IMPLANT
GLOVE BIOGEL PI IND STRL 7.5 (GLOVE) ×2 IMPLANT
GOWN STRL REUS W/ TWL LRG LVL3 (GOWN DISPOSABLE) ×4 IMPLANT
GOWN STRL REUS W/TWL 2XL LVL3 (GOWN DISPOSABLE) IMPLANT
GOWN STRL REUS W/TWL LRG LVL3 (GOWN DISPOSABLE) ×2
HEMOSTAT ARISTA ABSORB 3G PWDR (HEMOSTASIS) IMPLANT
KIT MARKER MARGIN INK (KITS) ×2 IMPLANT
MAT PREVALON FULL STRYKER (MISCELLANEOUS) IMPLANT
NDL HYPO 25X1 1.5 SAFETY (NEEDLE) ×2 IMPLANT
NEEDLE HYPO 25X1 1.5 SAFETY (NEEDLE) ×1
NS IRRIG 1000ML POUR BTL (IV SOLUTION) IMPLANT
PACK BASIN DAY SURGERY FS (CUSTOM PROCEDURE TRAY) ×2 IMPLANT
PENCIL SMOKE EVACUATOR (MISCELLANEOUS) ×2 IMPLANT
RETRACTOR ONETRAX LX 90X20 (MISCELLANEOUS) IMPLANT
SLEEVE SCD COMPRESS KNEE MED (STOCKING) ×2 IMPLANT
SPIKE FLUID TRANSFER (MISCELLANEOUS) IMPLANT
SPONGE T-LAP 4X18 ~~LOC~~+RFID (SPONGE) ×2 IMPLANT
STRIP CLOSURE SKIN 1/2X4 (GAUZE/BANDAGES/DRESSINGS) ×2 IMPLANT
SUT MNCRL AB 4-0 PS2 18 (SUTURE) ×2 IMPLANT
SUT MON AB 5-0 PS2 18 (SUTURE) IMPLANT
SUT SILK 2 0 PERMA HAND 18 BK (SUTURE) IMPLANT
SUT SILK 2 0 SH (SUTURE) IMPLANT
SUT VIC AB 2-0 SH 27 (SUTURE) ×1
SUT VIC AB 2-0 SH 27XBRD (SUTURE) ×2 IMPLANT
SUT VIC AB 3-0 SH 27 (SUTURE) ×1
SUT VIC AB 3-0 SH 27X BRD (SUTURE) ×2 IMPLANT
SUT VIC AB 5-0 PS2 18 (SUTURE) IMPLANT
SYR CONTROL 10ML LL (SYRINGE) ×2 IMPLANT
TOWEL GREEN STERILE FF (TOWEL DISPOSABLE) ×2 IMPLANT
TRAY FAXITRON CT DISP (TRAY / TRAY PROCEDURE) ×2 IMPLANT
TUBE CONNECTING 20X1/4 (TUBING) IMPLANT
YANKAUER SUCT BULB TIP NO VENT (SUCTIONS) IMPLANT

## 2023-08-17 NOTE — Op Note (Signed)
Preoperative diagnosis: Clinical stage I left breast cancer Postoperative diagnosis: Same as above Procedure: Left breast radioactive seed guided lumpectomy Surgeon: Dr. Harden Mo Anesthesia: General Estimated blood loss: Minimal Specimens: 1.  Left breast tissue containing seed and clip and overlying skin marked with paint 2.  Additional superior, medial, posterior margins marked short stitch superior, long stitch lateral, double stitch deep Complications: None Drains: None Sponge and count was correct completion Disposition recovery stable condition  Indications: This is a 72 year old female I know from prior lumpectomy and sentinel node in 2019.  She had a routine screening mammogram at left breast calcifications and an asymmetry noted.  There were 2 small adjacent asymmetries noted with a total span of  1.3 cm.  She had a node that had some cortical thickening that was negative.  Biopsy of the breast mass was a grade 2 invasive ductal carcinoma was ER positive, PR negative, HER2 negative.  We discussed all the options and elected to proceed with lumpectomy alone.  Procedure: After informed consent was obtained she was taken to the operating room.  She was given vancomycin due to her multiple allergies.  SCDs were in place.  She was placed under general anesthesia without complication.  She was prepped and draped in a standard sterile surgical fashion.  Surgical timeout was then performed.  I located the seed in the lower outer quadrant.  It was fairly close to the skin.  I elected to make an elliptical incision and excised the overlying skin to hopefully make my margins clear.  I then used cautery and the neoprobe to guide excision of the seed in the surrounding tissue with an attempt to get a clear margin.  I remove the lumpectomy specimen and marked it with paint I then did a mammogram which confirmed removal of the seed and the clip.  I thought I might be close to the 3 margin so I  remove these as above.  The posterior margin is now the pectoralis muscle.  I placed 2 clips in the cavity.  I then closed the cavity down after obtaining hemostasis with 2-0 Vicryl.  The skin was closed with 3-0 Vicryl and 4 Monocryl.  Glue and Steri-Strips were applied.  She tolerated this well was extubated and transferred recovery stable.

## 2023-08-17 NOTE — Transfer of Care (Signed)
Immediate Anesthesia Transfer of Care Note  Patient: [facility]  Procedure(s) Performed: LEFT BREAST SEED GUIDED LUMPECTOMY (Left: Breast)  Patient Location: PACU  Anesthesia Type:General  Level of Consciousness: awake, alert , and oriented  Airway & Oxygen Therapy: Patient Spontanous Breathing and Patient connected to face mask oxygen  Post-op Assessment: Report given to RN and Post -op Vital signs reviewed and stable  Post vital signs: Reviewed and stable  Last Vitals:  Vitals Value Taken Time  BP 133/62 08/17/23 1145  Temp    Pulse 74 08/17/23 1146  Resp 21 08/17/23 1146  SpO2 97 % 08/17/23 1146  Vitals shown include unfiled device data.  Last Pain:  Vitals:   08/17/23 0844  TempSrc: Temporal  PainSc: 0-No pain      Patients Stated Pain Goal: 4 (08/17/23 0844)  Complications: No notable events documented.

## 2023-08-17 NOTE — Discharge Instructions (Addendum)
[facility] Office Phone Number [phone]  POST OP INSTRUCTIONS Take 400 mg of ibuprofen every 8 hours or 650 mg tylenol every 6 hours for next 72 hours then as needed. Use ice several times daily also.  A prescription for pain medication may be given to you upon discharge.  Take your pain medication as prescribed, if needed.  If narcotic pain medicine is not needed, then you may take acetaminophen (Tylenol), naprosyn (Alleve) or ibuprofen (Advil) as needed. Take your usually prescribed medications unless otherwise directed If you need a refill on your pain medication, please contact your pharmacy.  They will contact our office to request authorization.  Prescriptions will not be filled after 5pm or on week-ends. You should eat very light the first 24 hours after surgery, such as soup, crackers, pudding, etc.  Resume your normal diet the day after surgery. Most patients will experience some swelling and bruising in the breast.  Ice packs and a good support bra will help.  Wear the breast binder provided or a sports bra for 72 hours day and night.  After that wear a sports bra during the day until you return to the office. Swelling and bruising can take several days to resolve.  It is common to experience some constipation if taking pain medication after surgery.  Increasing fluid intake and taking a stool softener will usually help or prevent this problem from occurring.  A mild laxative (Milk of Magnesia or Miralax) should be taken according to package directions if there are no bowel movements after 48 hours. I used skin glue on the incision, you may shower in 24 hours.  The glue will flake off over the next 2-3 weeks.  Any sutures or staples will be removed at the office during your follow-up visit. ACTIVITIES:  You may resume regular daily activities (gradually increasing) beginning the next day.  Wearing a good support bra or sports bra minimizes pain and swelling.  You may have  sexual intercourse when it is comfortable. You may drive when you no longer are taking prescription pain medication, you can comfortably wear a seatbelt, and you can safely maneuver your car and apply brakes. RETURN TO WORK:  ______________________________________________________________________________________ Bonita Quin should see your doctor in the office for a follow-up appointment approximately two weeks after your surgery.  Your doctor's nurse will typically make your follow-up appointment when she calls you with your pathology report.  Expect your pathology report 3-4 business days after your surgery.  You may call to check if you do not hear from [place] after three days. OTHER INSTRUCTIONS: _______________________________________________________________________________________________ _____________________________________________________________________________________________________________________________________ _____________________________________________________________________________________________________________________________________ _____________________________________________________________________________________________________________________________________  WHEN TO CALL DR WAKEFIELD: Fever over 101.0 Nausea and/or vomiting. Extreme swelling or bruising. Continued bleeding from incision. Increased pain, redness, or drainage from the incision.  The clinic staff is available to answer your questions during regular business hours.  Please don't hesitate to call and ask to speak to one of the nurses for clinical concerns.  If you have a medical emergency, go to the nearest emergency room or call 911.  A surgeon from [facility] is always on call at the hospital.  For further questions, please visit [email or web address]    Post Anesthesia Home Care Instructions  Activity: Get plenty of rest for the remainder of the day. A responsible individual must stay  with you for 24 hours following the procedure.  For the next 24 hours, DO NOT: -Drive a car -Advertising copywriter -Drink alcoholic beverages -Take any medication unless instructed  by your physician -Make any legal decisions or sign important papers.  Meals: Start with liquid foods such as gelatin or soup. Progress to regular foods as tolerated. Avoid greasy, spicy, heavy foods. If nausea and/or vomiting occur, drink only clear liquids until the nausea and/or vomiting subsides. Call your physician if vomiting continues.  Special Instructions/Symptoms: Your throat may feel dry or sore from the anesthesia or the breathing tube placed in your throat during surgery. If this causes discomfort, gargle with warm salt water. The discomfort should disappear within 24 hours.  If you had a scopolamine patch placed behind your ear for the management of post- operative nausea and/or vomiting:  1. The medication in the patch is effective for 72 hours, after which it should be removed.  Wrap patch in a tissue and discard in the trash. Wash hands thoroughly with soap and water. 2. You may remove the patch earlier than 72 hours if you experience unpleasant side effects which may include dry mouth, dizziness or visual disturbances. 3. Avoid touching the patch. Wash your hands with soap and water after contact with the patch.     Last dose of tylenol given at 850am

## 2023-08-17 NOTE — H&P (Signed)
72 year old female I know from surgery in 2019. She underwent a lumpectomy and sentinel lymph node biopsy then. This had a 1.2 cm grade 3 invasive ductal carcinoma and she had 7 negative sentinel lymph nodes. I did have to reexcised her margins. This was hormone receptor positive and HER2 negative. Oncotype was a 21. She did undergo radiotherapy. She then has been treated with an antiestrogen. She had her routine screening mammogram recently. She has B density breast tissue. She had some left calcifications and an asymmetry noted. There are 2 small adjacent asymmetries in her left breast on ultrasound. These measured 6 mm in greatest dimension. Total span was 1.3 cm. She had a single node with cortical thickening of 4 mm. This was after she had received a tetanus vaccination. Biopsy of the node was negative. Biopsy of the breast tumor was a grade 2 invasive ductal carcinoma that is ER positive at 95%, PR negative, her 2 negative and the Ki-67 is 10%. She is here to discuss options   Review of Systems: A complete review of systems was obtained from the patient. I have reviewed this information and discussed as appropriate with the patient. See HPI as well for other ROS.  Review of Systems  HENT: Positive for congestion.  All other systems reviewed and are negative.   Medical History: Past Medical History:  Diagnosis Date  Arthritis  GERD (gastroesophageal reflux disease)  History of cancer  Sleep apnea   There is no problem list on file for this patient.  Past Surgical History:  Procedure Laterality Date  MASTECTOMY PARTIAL / LUMPECTOMY Right 08/11/2018  DEEP AXILLARY SENTINEL NODE BIOPSY / EXCISION Right 08/11/2018  ORIF PROXIMAL TIBIAL PLATEAU FRACTURE    Allergies  Allergen Reactions  Zinacef [Cefuroxime Sodium] Anaphylaxis  Adhesive Tape-Silicones Itching  Augmentin [Amoxicillin-Pot Clavulanate] Other (See Comments)  GI  Avelox [Moxifloxacin] Dizziness  Fenofibrate Headache   Penicillin Itching  Rosuvastatin Other (See Comments)  BRAIN FOG  Sulfa (Sulfonamide Antibiotics) Itching   Current Outpatient Medications on File Prior to Visit  Medication Sig Dispense Refill  calcium carbonate 500 mg calcium (1,250 mg) tablet Take by mouth  candesartan (ATACAND) 8 MG tablet Take 8 mg by mouth once daily  cholecalciferol (VITAMIN D3) 2,000 unit capsule Take 2,000 Units by mouth once daily  esomeprazole (NEXIUM) 40 MG DR capsule Take 20 mg by mouth once daily  ezetimibe (ZETIA) 10 mg tablet Take by mouth  VASCEPA 1 gram capsule 1 CAPSULE BY MOUTH 3 TIMES DAILY 1RST ATTEMPT, PATIENT NEEDS AND APPT FOR ADDITIONAL REFILLS   No current facility-administered medications on file prior to visit.   Family History  Problem Relation Age of Onset  High blood pressure (Hypertension) Mother  Hyperlipidemia (Elevated cholesterol) Mother  Diabetes Mother  Obesity Mother  High blood pressure (Hypertension) Father  Hyperlipidemia (Elevated cholesterol) Father  Coronary Artery Disease (Blocked arteries around heart) Father    Social History   Tobacco Use  Smoking Status Never  Smokeless Tobacco Never    Social History   Socioeconomic History  Marital status: Married  Tobacco Use  Smoking status: Never  Smokeless tobacco: Never  Vaping Use  Vaping status: Never Used  Substance and Sexual Activity  Alcohol use: Not Currently  Drug use: Never   Objective:   Vitals:  07/26/23 1425  BP: (!) 160/70  Pulse: 74  Temp: 36.3 C (97.3 F)  SpO2: 97%  Weight: (!) 132.8 kg (292 lb 12.8 oz)  Height: 165.1 cm (5\' 5" )  PainSc: 0-No pain   Body mass index is 48.72 kg/m.  Physical Exam Vitals reviewed.  Constitutional:  Appearance: Normal appearance.  Chest:  Breasts: Right: No inverted nipple, mass or nipple discharge.  Left: No inverted nipple, mass or nipple discharge.  Lymphadenopathy:  Upper Body:  Right upper body: No supraclavicular or axillary  adenopathy.  Left upper body: No supraclavicular or axillary adenopathy.  Neurological:  Mental Status: She is alert.     Assessment and Plan:   Malignant neoplasm of upper-inner quadrant of left breast in female, estrogen receptor positive (CMS/HHS-HCC)  Right breast seed guided lumpectomy  We discussed the staging and pathophysiology of breast cancer. We discussed all of the different options for treatment for breast cancer including surgery, chemotherapy, radiation therapy, Herceptin, and antiestrogen therapy.  We discussed a sentinel lymph node biopsy. Will await the HER2 final prior to a decision. If this is negative I will discuss with oncology based on choosing wisely data and the sound trial omitting a sentinel lymph node biopsy on her.  We discussed the options for treatment of the breast cancer which included lumpectomy versus a mastectomy. We discussed the performance of the lumpectomy with radioactive seed placement. We discussed a 5-10% chance of a positive margin requiring reexcision in the operating room. We also discussed that she will likely need radiation therapy if she undergoes lumpectomy. We discussed mastectomy and the postoperative care for that as well. Mastectomy can be followed by reconstruction. The decision for lumpectomy vs mastectomy has no impact on decision for chemotherapy. Most mastectomy patients will not need radiation therapy. We discussed that there is no difference in her survival whether she undergoes lumpectomy with radiation therapy or antiestrogen therapy versus a mastectomy. There is also no real difference between her recurrence in the breast.  We discussed the risks of operation including bleeding, infection, possible reoperation. She understands her further therapy will be based on what her stages at the time of her operation.

## 2023-08-17 NOTE — Interval H&P Note (Signed)
History and Physical Interval Note:  08/17/2023 10:12 AM  [facility]  has presented today for surgery, with the diagnosis of LEFT BREAST CANCER.  The various methods of treatment have been discussed with the patient and family. After consideration of risks, benefits and other options for treatment, the patient has consented to  Procedure(s) with comments: LEFT BREAST SEED GUIDED LUMPECTOMY (Left) - LMA as a surgical intervention.  The patient's history has been reviewed, patient examined, no change in status, stable for surgery.  I have reviewed the patient's chart and labs.  Questions were answered to the patient's satisfaction.     Catherine [name]

## 2023-08-17 NOTE — Anesthesia Procedure Notes (Addendum)
Procedure Name: LMA Insertion Date/Time: 08/17/2023 10:48 AM  Performed by: Karen Kitchens, CRNAPre-anesthesia Checklist: Patient identified, Emergency Drugs available, Suction available and Patient being monitored Patient Re-evaluated:Patient Re-evaluated prior to induction Oxygen Delivery Method: Circle system utilized Preoxygenation: Pre-oxygenation with 100% oxygen Induction Type: IV induction Ventilation: Mask ventilation without difficulty LMA: LMA inserted LMA Size: 4.0 Number of attempts: 1 Airway Equipment and Method: Bite block Placement Confirmation: positive ETCO2 Tube secured with: Tape Dental Injury: Teeth and Oropharynx as per pre-operative assessment

## 2023-08-17 NOTE — Anesthesia Postprocedure Evaluation (Signed)
Anesthesia Post Note  Patient: Catherine [name]  Procedure(s) Performed: LEFT BREAST SEED GUIDED LUMPECTOMY (Left: Breast)     Patient location during evaluation: PACU Anesthesia Type: General Level of consciousness: awake and alert Pain management: pain level controlled Vital Signs Assessment: post-procedure vital signs reviewed and stable Respiratory status: spontaneous breathing, nonlabored ventilation, respiratory function stable and patient connected to nasal cannula oxygen Cardiovascular status: blood pressure returned to baseline and stable Postop Assessment: no apparent nausea or vomiting Anesthetic complications: no   No notable events documented.  Last Vitals:  Vitals:   08/17/23 1208 08/17/23 1228  BP: (!) 158/70 (!) 163/80  Pulse: 69   Resp: 14 18  Temp:  (!) 36.3 C  SpO2: 94% 97%    Last Pain:  Vitals:   08/17/23 1228  TempSrc:   PainSc: 0-No pain                 Trevor Iha

## 2023-08-18 ENCOUNTER — Encounter: Payer: Self-pay | Admitting: Oncology

## 2023-08-18 ENCOUNTER — Telehealth: Payer: Self-pay | Admitting: Cardiology

## 2023-08-18 ENCOUNTER — Other Ambulatory Visit: Payer: Self-pay

## 2023-08-18 ENCOUNTER — Other Ambulatory Visit: Payer: Self-pay | Admitting: Cardiology

## 2023-08-18 ENCOUNTER — Other Ambulatory Visit (HOSPITAL_BASED_OUTPATIENT_CLINIC_OR_DEPARTMENT_OTHER): Payer: Medicare PPO

## 2023-08-18 ENCOUNTER — Ambulatory Visit (HOSPITAL_BASED_OUTPATIENT_CLINIC_OR_DEPARTMENT_OTHER): Payer: Self-pay | Admitting: Obstetrics & Gynecology

## 2023-08-18 ENCOUNTER — Other Ambulatory Visit: Payer: Self-pay | Admitting: Hematology and Oncology

## 2023-08-18 DIAGNOSIS — Z170 Estrogen receptor positive status [ER+]: Secondary | ICD-10-CM

## 2023-08-18 DIAGNOSIS — C50411 Malignant neoplasm of upper-outer quadrant of right female breast: Secondary | ICD-10-CM

## 2023-08-18 LAB — SURGICAL PATHOLOGY

## 2023-08-18 MED ORDER — EZETIMIBE 10 MG PO TABS: 10.0000 mg | ORAL_TABLET | Freq: Every day | ORAL | 3 refills | Status: DC

## 2023-08-18 NOTE — Telephone Encounter (Signed)
Rx refill sent to pharmacy. 

## 2023-08-18 NOTE — Telephone Encounter (Signed)
*  STAT* If patient is at the pharmacy, call can be transferred to refill team.   1. Which medications need to be refilled? (please list name of each medication and dose if known) ezetimibe (ZETIA) 10 MG tablet   2. Which pharmacy/location (including street and city if local pharmacy) is medication to be sent to?  [id number] - , [place] - [address]      3. Do they need a 30 day or 90 day supply? 90 day    Pt is out of this medication

## 2023-08-18 NOTE — Telephone Encounter (Signed)
Called patient and informed her that her Zetia medication had been refilled and sent to her pharmacy. Patient verbalized understanding and had no further questions at this time.

## 2023-08-19 ENCOUNTER — Encounter (HOSPITAL_BASED_OUTPATIENT_CLINIC_OR_DEPARTMENT_OTHER): Payer: Self-pay | Admitting: General Surgery

## 2023-08-19 DIAGNOSIS — I10 Essential (primary) hypertension: Secondary | ICD-10-CM | POA: Diagnosis not present

## 2023-08-19 DIAGNOSIS — F419 Anxiety disorder, unspecified: Secondary | ICD-10-CM | POA: Diagnosis not present

## 2023-08-19 DIAGNOSIS — G4733 Obstructive sleep apnea (adult) (pediatric): Secondary | ICD-10-CM | POA: Diagnosis not present

## 2023-08-19 DIAGNOSIS — F32A Depression, unspecified: Secondary | ICD-10-CM | POA: Diagnosis not present

## 2023-08-26 ENCOUNTER — Ambulatory Visit (INDEPENDENT_AMBULATORY_CARE_PROVIDER_SITE_OTHER): Payer: Medicare PPO | Admitting: *Deleted

## 2023-08-26 DIAGNOSIS — J309 Allergic rhinitis, unspecified: Secondary | ICD-10-CM | POA: Diagnosis not present

## 2023-08-31 ENCOUNTER — Ambulatory Visit: Payer: Medicare PPO | Admitting: Oncology
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
## 2023-09-01 ENCOUNTER — Inpatient Hospital Stay: Payer: Medicare PPO | Attending: Oncology | Admitting: Oncology

## 2023-09-01 ENCOUNTER — Telehealth: Payer: Self-pay

## 2023-09-01 ENCOUNTER — Other Ambulatory Visit: Payer: Self-pay

## 2023-09-01 ENCOUNTER — Encounter: Payer: Self-pay | Admitting: Oncology

## 2023-09-01 VITALS — BP 142/80 | HR 63 | Temp 97.9°F | Resp 18 | Ht 66.0 in | Wt 295.2 lb

## 2023-09-01 DIAGNOSIS — Z853 Personal history of malignant neoplasm of breast: Secondary | ICD-10-CM | POA: Diagnosis not present

## 2023-09-01 DIAGNOSIS — M8589 Other specified disorders of bone density and structure, multiple sites: Secondary | ICD-10-CM | POA: Insufficient documentation

## 2023-09-01 DIAGNOSIS — C50812 Malignant neoplasm of overlapping sites of left female breast: Secondary | ICD-10-CM | POA: Insufficient documentation

## 2023-09-01 DIAGNOSIS — E039 Hypothyroidism, unspecified: Secondary | ICD-10-CM | POA: Diagnosis not present

## 2023-09-01 DIAGNOSIS — N83209 Unspecified ovarian cyst, unspecified side: Secondary | ICD-10-CM | POA: Diagnosis not present

## 2023-09-01 DIAGNOSIS — Z170 Estrogen receptor positive status [ER+]: Secondary | ICD-10-CM | POA: Insufficient documentation

## 2023-09-01 DIAGNOSIS — C50212 Malignant neoplasm of upper-inner quadrant of left female breast: Secondary | ICD-10-CM | POA: Diagnosis not present

## 2023-09-01 NOTE — Telephone Encounter (Signed)
-----   Message from Dellia Beckwith sent at 08/31/2023  5:32 PM EDT ----- Regarding: oncotype Any chance her Oncotype is back yet?

## 2023-09-01 NOTE — Telephone Encounter (Signed)
Waiting on insurance authorization but should be resulted by 09/11/23.

## 2023-09-01 NOTE — Telephone Encounter (Signed)
Need Flu vaccine scheduled. Please call [phone].

## 2023-09-02 ENCOUNTER — Ambulatory Visit (HOSPITAL_BASED_OUTPATIENT_CLINIC_OR_DEPARTMENT_OTHER): Payer: Medicare PPO | Admitting: Obstetrics & Gynecology

## 2023-09-06 ENCOUNTER — Encounter: Payer: Self-pay | Admitting: Oncology

## 2023-09-06 DIAGNOSIS — Z170 Estrogen receptor positive status [ER+]: Secondary | ICD-10-CM | POA: Diagnosis not present

## 2023-09-06 DIAGNOSIS — C50912 Malignant neoplasm of unspecified site of left female breast: Secondary | ICD-10-CM | POA: Diagnosis not present

## 2023-09-07 ENCOUNTER — Encounter: Payer: Self-pay | Admitting: Oncology

## 2023-09-07 ENCOUNTER — Inpatient Hospital Stay: Payer: Medicare PPO | Attending: Oncology

## 2023-09-07 VITALS — BP 184/78 | HR 65 | Temp 98.2°F | Resp 18 | Wt 258.0 lb

## 2023-09-07 DIAGNOSIS — Z23 Encounter for immunization: Secondary | ICD-10-CM | POA: Diagnosis not present

## 2023-09-07 MED ORDER — INFLUENZA VAC A&B SURF ANT ADJ 0.5 ML IM SUSY
0.5000 mL | PREFILLED_SYRINGE | Freq: Once | INTRAMUSCULAR | Status: AC
  Administered 2023-09-07: 0.5 mL via INTRAMUSCULAR
  Filled 2023-09-07: qty 0.5

## 2023-09-07 NOTE — Patient Instructions (Signed)
Influenza Nasal Vaccine, Live What is this medication? INFLUENZA NASAL VACCINE (in floo EN zuh NEY zuhl vak SEEN) reduces the risk of the influenza (flu). It does not treat influenza. It is still possible to get influenza after receiving this vaccine, but the symptoms may be less severe or not last as long. It works by helping your immune system learn how to fight off a future infection. This medicine may be used for other purposes; ask your health care provider or pharmacist if you have questions. COMMON BRAND NAME(S): FluMist What should I tell my care team before I take this medication? They need to know if you have any of these conditions: Asthma or wheezing Guillain-Barre syndrome or other neurological problems Immune system problems Other chronic health problems Under 18 and taking aspirin An unusual or allergic reaction to influenza vaccine, other medications, foods, dyes, or preservatives. Different brands of vaccines contain different allergens. Some may contain latex or eggs. Talk to your care team about your allergies to make sure that you get the right vaccine. Pregnant or trying to get pregnant Breastfeeding How should I use this medication? This vaccine is for use in the nose. Do not take by mouth. It is given by your care team. A copy of Vaccine Information Statements will be given before each vaccination. Be sure to read this information carefully each time. This sheet may change often. Talk to your care team about the use of this medication in children. While it may be prescribed for children as young as 2 years for selected conditions, precautions do apply. This medication is not approved for use in people over 16 years old. Overdosage: If you think you have taken too much of this medicine contact a poison control center or emergency room at once. NOTE: This medicine is only for you. Do not share this medicine with others. What if I miss a dose? This does not apply. What may  interact with this medication? Do not take this medication with any of the following : Anakinra This medication may also interact with the following: Aspirin and aspirin-like medications Certain medications for arthritis Medications for organ transplant Medications to treat cancer Medications to treat the flu Other medications used in the nose Other vaccines Steroid medications, such as prednisone or cortisone This list may not describe all possible interactions. Give your health care provider a list of all the medicines, herbs, non-prescription drugs, or dietary supplements you use. Also tell them if you smoke, drink alcohol, or use illegal drugs. Some items may interact with your medicine. What should I watch for while using this medication? Report any side effects to your care team right away. After receiving this vaccine, stay away from people who have severe immune system problems for 7 days. You may give them the flu. This vaccine lowers your risk of getting the flu. You may still get a milder flu infection if you are around others with the flu. The flu vaccine will not protect against colds or other illnesses. A yearly vaccination for the flu is recommended. What side effects may I notice from receiving this medication? Side effects that you should report to your care team as soon as possible: Allergic reactions--skin rash, itching, hives, swelling of the face, lips, tongue, or throat Wheezing or trouble breathing Side effects that usually do not require medical attention (report these to your care team if they continue or are bothersome): Fatigue Headache Irritability Loss or appetite Muscle pain Runny or stuffy nose Sore throat  This list may not describe all possible side effects. Call your doctor for medical advice about side effects. You may report side effects to FDA at 1-800-FDA-1088. Where should I keep my medication? This vaccine is only given by your care team. It will  not be stored at home. NOTE: This sheet is a summary. It may not cover all possible information. If you have questions about this medicine, talk to your doctor, pharmacist, or health care provider.  2024 Elsevier/Gold Standard (2022-03-31 00:00:00)

## 2023-09-08 ENCOUNTER — Other Ambulatory Visit (HOSPITAL_BASED_OUTPATIENT_CLINIC_OR_DEPARTMENT_OTHER): Payer: Medicare PPO

## 2023-09-08 ENCOUNTER — Ambulatory Visit (HOSPITAL_BASED_OUTPATIENT_CLINIC_OR_DEPARTMENT_OTHER): Payer: Self-pay | Admitting: Obstetrics & Gynecology

## 2023-09-08 ENCOUNTER — Encounter (HOSPITAL_BASED_OUTPATIENT_CLINIC_OR_DEPARTMENT_OTHER): Payer: Self-pay

## 2023-09-08 DIAGNOSIS — M9903 Segmental and somatic dysfunction of lumbar region: Secondary | ICD-10-CM | POA: Diagnosis not present

## 2023-09-08 DIAGNOSIS — M9905 Segmental and somatic dysfunction of pelvic region: Secondary | ICD-10-CM | POA: Diagnosis not present

## 2023-09-08 DIAGNOSIS — M9902 Segmental and somatic dysfunction of thoracic region: Secondary | ICD-10-CM | POA: Diagnosis not present

## 2023-09-08 DIAGNOSIS — M62830 Muscle spasm of back: Secondary | ICD-10-CM | POA: Diagnosis not present

## 2023-09-08 DIAGNOSIS — S336XXA Sprain of sacroiliac joint, initial encounter: Secondary | ICD-10-CM | POA: Diagnosis not present

## 2023-09-10 ENCOUNTER — Encounter (HOSPITAL_COMMUNITY): Payer: Self-pay

## 2023-09-14 ENCOUNTER — Telehealth: Payer: Self-pay

## 2023-09-14 NOTE — Telephone Encounter (Signed)
-----   Message from Dellia Beckwith sent at 09/10/2023 12:28 PM EST ----- That's fine, will be about 1 month postop ----- Message ----- From: Dyane Dustman, RN Sent: 09/10/2023  10:55 AM EST To: Dellia Beckwith, MD  We have her scheduled for 09/22/2023.  Do we need to see sooner? ----- Message ----- From: Dellia Beckwith, MD Sent: 09/10/2023   8:41 AM EST To: Lance Bosch, MD; Adah Perl, PA-C; #  I called and discussed with her this am.  Her actual benefit of chemo is 5-6% with RS 27 and we discussed risks and benefits and will not pursue chemotherapy.  She will proceed with appt with Dr. Thersa Salt this month and wishes to proceed with radiation.  I answered her questions and will see her in Jan to start hormonal therapy ----- Message ----- From: Adah Perl, PA-C Sent: 09/07/2023   1:08 PM EST To: Dellia Beckwith, MD  Oncoype DX score 27. For patients >50 there is a >15% benefit from chemotherapy with score of 26 or higher. Do you want Dr Melvyn Neth or Rib[facility]ove to see to discuss chemo? She does not have a f/u. Thanks

## 2023-09-15 ENCOUNTER — Ambulatory Visit (INDEPENDENT_AMBULATORY_CARE_PROVIDER_SITE_OTHER): Payer: Medicare PPO | Admitting: *Deleted

## 2023-09-15 DIAGNOSIS — S336XXA Sprain of sacroiliac joint, initial encounter: Secondary | ICD-10-CM | POA: Diagnosis not present

## 2023-09-15 DIAGNOSIS — J309 Allergic rhinitis, unspecified: Secondary | ICD-10-CM

## 2023-09-15 DIAGNOSIS — M9902 Segmental and somatic dysfunction of thoracic region: Secondary | ICD-10-CM | POA: Diagnosis not present

## 2023-09-15 DIAGNOSIS — G4733 Obstructive sleep apnea (adult) (pediatric): Secondary | ICD-10-CM | POA: Diagnosis not present

## 2023-09-15 DIAGNOSIS — M62830 Muscle spasm of back: Secondary | ICD-10-CM | POA: Diagnosis not present

## 2023-09-15 DIAGNOSIS — M9905 Segmental and somatic dysfunction of pelvic region: Secondary | ICD-10-CM | POA: Diagnosis not present

## 2023-09-15 DIAGNOSIS — M9903 Segmental and somatic dysfunction of lumbar region: Secondary | ICD-10-CM | POA: Diagnosis not present

## 2023-09-21 DIAGNOSIS — S336XXA Sprain of sacroiliac joint, initial encounter: Secondary | ICD-10-CM | POA: Diagnosis not present

## 2023-09-21 DIAGNOSIS — M9902 Segmental and somatic dysfunction of thoracic region: Secondary | ICD-10-CM | POA: Diagnosis not present

## 2023-09-21 DIAGNOSIS — M9905 Segmental and somatic dysfunction of pelvic region: Secondary | ICD-10-CM | POA: Diagnosis not present

## 2023-09-21 DIAGNOSIS — M9903 Segmental and somatic dysfunction of lumbar region: Secondary | ICD-10-CM | POA: Diagnosis not present

## 2023-09-21 DIAGNOSIS — M62830 Muscle spasm of back: Secondary | ICD-10-CM | POA: Diagnosis not present

## 2023-09-22 ENCOUNTER — Encounter: Payer: Self-pay | Admitting: Oncology

## 2023-09-22 ENCOUNTER — Ambulatory Visit
Admission: RE | Admit: 2023-09-22 | Discharge: 2023-09-22 | Disposition: A | Discharge status: Home / Self Care | Payer: Medicare PPO | Source: Ambulatory Visit | Attending: Radiation Oncology | Admitting: Radiation Oncology

## 2023-09-22 VITALS — BP 180/70 | HR 68 | Temp 98.6°F | Resp 20 | Ht 66.0 in | Wt 298.2 lb

## 2023-09-22 DIAGNOSIS — C50812 Malignant neoplasm of overlapping sites of left female breast: Secondary | ICD-10-CM

## 2023-09-22 DIAGNOSIS — Z170 Estrogen receptor positive status [ER+]: Secondary | ICD-10-CM | POA: Diagnosis not present

## 2023-09-22 DIAGNOSIS — C50411 Malignant neoplasm of upper-outer quadrant of right female breast: Secondary | ICD-10-CM | POA: Diagnosis not present

## 2023-09-22 NOTE — Progress Notes (Signed)
Radiation Oncology         [phone] ________________________________  Name: Catherine [name]        MRN: [id number]  Date of Service: 09/22/2023 DOB: 09-27-1951  BM:[id number], No Pcp Per  No ref. provider found     REFERRING PHYSICIAN: No ref. provider found   DIAGNOSIS: {There were no encounter diagnoses. (Refresh or delete this SmartLink)}   HISTORY OF PRESENT ILLNESS: Catherine [name] is a 72 y.o. female seen at the request of Dr. Marland Kitchen    PREVIOUS RADIATION THERAPY: {EXAM; YES/NO:19492::"No"}   PAST MEDICAL HISTORY:  Past Medical History:  Diagnosis Date   Adiposity 05/26/2014   Allergic rhinoconjunctivitis 06/28/2015   Allergies    grasses, dander, dust, etc, etc   Anxiety    situational   Arthritis    Avitaminosis D 05/26/2014   Breast cancer (HCC)    De Quervain's tenosynovitis, left 11/08/2019   Elevation of level of transaminase or lactic acid dehydrogenase (LDH) 05/26/2014   Essential (primary) hypertension 05/26/2014   Family history of breast cancer    Family history of pancreatic cancer    Fatty infiltration of liver 05/26/2014   Fibromyalgia    Foot injury, left, initial encounter 05/17/2018   Genetic testing 08/01/2018   The Common Hereditary Cancer Panel offered by [facility] includes sequencing and/or deletion duplication testing of the following 55 genes: APC, ATM, AXIN2, BARD1, BLM, BMPR1A, BRCA1, BRCA2, BRIP1, BUB1B, CDH1, CDK4, CDKN2A, CEP57, CHEK2, CTNNA1, DICER1, ENG, EPCAM, GALNT12, GREM1, HOXB13, KIT, MEN1, MLH1, MLH3, MSH2, MSH3, MSH6, MUTYH, NBN, NF1, NTHL1, PALB2, PDGFRA, PMS2, POLD1, POLE, PTEN, RAD50,    GERD (gastroesophageal reflux disease)    Heart murmur    "not sure"   HLD (hyperlipidemia) 05/26/2014   Hypertension    Laryngopharyngeal reflux 06/28/2015   Low back pain 05/26/2014   Malignant neoplasm of upper-outer quadrant of right breast in female, estrogen receptor positive (HCC) 08/18/2018   Mild pulmonary hypertension (HCC) 12/03/2015    Overview:  48 mmHg in August 2017  Formatting of this note might be different from the original. Overview:  48 mmHg in August 2017 Formatting of this note might be different from the original. 48 mmHg in August 2017   Mitral valve disease 05/26/2014   MVP (mitral valve prolapse)    Myalgia and myositis 05/26/2014   Nodular goiter, non-toxic 05/26/2014   Obesity    OSA (obstructive sleep apnea) 05/26/2014   Perimenopausal    Plantar fasciitis 05/26/2014   Pulmonary hypertension, primary (HCC) 05/24/2017   Most likely related to sleep apnea   Shingles    Sleep apnea    uses CPAP   Treatment-emergent central sleep apnea 06/30/2017       PAST SURGICAL HISTORY: Past Surgical History:  Procedure Laterality Date   BIOPSY BREAST Left    BREAST BIOPSY Left 08/16/2023   [place] LT RADIOACTIVE SEED LOC 08/16/2023 [facility]   BREAST LUMPECTOMY WITH RADIOACTIVE SEED AND SENTINEL LYMPH NODE BIOPSY Right 08/11/2018   Procedure: RIGHT BREAST LUMPECTOMY WITH BRACKETED RADIOACTIVE SEED AND RIGHT SENTINEL LYMPH NODE BIOPSY;  Surgeon: Emelia Loron, MD;  Location: [facility] OR;  Service: General;  Laterality: Right;   BREAST LUMPECTOMY WITH RADIOACTIVE SEED LOCALIZATION Left 08/17/2023   Procedure: LEFT BREAST SEED GUIDED LUMPECTOMY;  Surgeon: Emelia Loron, MD;  Location: [facility];  Service: General;  Laterality: Left;  LMA   KNEE SURGERY Right 06/02/1994     FAMILY HISTORY:  Family History  Problem Relation Age  of Onset   Diabetes Mother    Hypertension Mother    Allergic rhinitis Mother    Kidney failure Mother    Hypertension Father    Heart attack Father    Infertility Sister    Cancer Paternal Grandmother    Asthma Maternal Grandfather    Pancreatic cancer Maternal Grandfather 4   Heart disease Paternal Aunt    Other Cousin        liver cancer/failure?   Breast cancer Other 80     SOCIAL HISTORY:  reports that she has never smoked. She has never used smokeless  tobacco. She reports that she does not drink alcohol and does not use drugs.   ALLERGIES: Other, Penicillin g, Zinacef [cefuroxime in sterile water], Fenofibrate, Rosuvastatin, Tape, Augmentin [amoxicillin-pot clavulanate], Desipramine hcl, Silicone, Sulfasalazine, Tetanus antitoxin, Tetanus toxoids, Tetanus-diphtheria toxoids td, Tetanus-diphtheria toxoids td, Thimerosal and related, Avelox [moxifloxacin hcl in nacl], Diclofenac sodium, Diclofenac sodium, Iodine, Moxifloxacin, and Sulfa antibiotics   MEDICATIONS:  Current Outpatient Medications  Medication Sig Dispense Refill   anastrozole (ARIMIDEX) 1 MG tablet TAKE 1 TABLET BY MOUTH EVERY DAY (Patient taking differently: Take 1 mg by mouth daily.) 90 tablet 3   calcium carbonate (OS-CAL - DOSED IN MG OF ELEMENTAL CALCIUM) 1250 (500 Ca) MG tablet Take 1 tablet by mouth daily.     candesartan (ATACAND) 8 MG tablet TAKE 1 TABLET BY MOUTH EVERY DAY 90 tablet 3   cetirizine (ZYRTEC) 10 MG tablet Take 10 mg by mouth daily.     Cholecalciferol (VITAMIN D) 2000 units CAPS Take 2,000 Units by mouth daily.      EPINEPHrine 0.3 mg/0.3 mL IJ SOAJ injection Use as directed for life-threatening allergic reaction. (Patient taking differently: Inject 0.3 mg into the muscle as needed for anaphylaxis. Use as directed for life-threatening allergic reaction.) 1 each 3   esomeprazole (NEXIUM) 20 MG capsule Take one capsule by mouth once daily. (Patient taking differently: Take 20 mg by mouth daily at 12 noon. Take one capsule by mouth once daily.) 90 capsule 3   ezetimibe (ZETIA) 10 MG tablet Take 1 tablet (10 mg total) by mouth daily. 90 tablet 3   guaifenesin (HUMIBID E) 400 MG TABS tablet Take 400 mg by mouth daily as needed (for cough/congestion).     icosapent Ethyl (VASCEPA) 1 g capsule Take 2 capsules (2 g total) by mouth 3 (three) times daily. 270 capsule 3   meloxicam (MOBIC) 7.5 MG tablet Take 7.5 mg by mouth daily.     NASACORT ALLERGY 24HR 55 MCG/ACT  AERO nasal inhaler Use one spray in each nostril once daily as directed. (Patient taking differently: Place 2 sprays into the nose daily. Use one spray in each nostril once daily as directed.) 1 Inhaler 0   NASAL SALINE NA Place 1 spray into the nose daily.     Olopatadine HCl 0.6 % SOLN Can use one to two sprays in each nostril one to two times daily if needed. (Patient taking differently: Place 2 sprays into both nostrils daily as needed (dryness). Can use one to two sprays in each nostril one to two times daily if needed.) 30.5 g 11   Olopatadine-Mometasone (RYALTRIS) 665-25 MCG/ACT SUSP 2 sprays each nostril 1-2 times per day (Patient taking differently: Place 2 sprays into the nose See admin instructions. 2 sprays each nostril 1-2 times per day) 29 g 5   triamcinolone ointment (KENALOG) 0.1 % Apply 1 Application topically 2 (two) times daily.  valACYclovir (VALTREX) 1000 MG tablet Take 2 tabs and repeat in 12 hours. (Patient taking differently: Take 1,000 mg by mouth 2 (two) times daily. Take 2 tabs and repeat in 12 hours.) 30 tablet 1   No current facility-administered medications for this encounter.     REVIEW OF SYSTEMS: On review of systems, the patient reports that *** is doing well overall. *** denies any chest pain, shortness of breath, cough, fevers, chills, night sweats, unintended weight changes. *** denies any bowel or bladder disturbances, and denies abdominal pain, nausea or vomiting. *** denies any new musculoskeletal or joint aches or pains. A complete review of systems is obtained and is otherwise negative.     PHYSICAL EXAM:  Wt Readings from Last 3 Encounters:  09/22/23 298 lb 3.2 oz (135.3 kg)  09/07/23 258 lb (117 kg)  09/01/23 295 lb 3.2 oz (133.9 kg)   Temp Readings from Last 3 Encounters:  09/22/23 98.6 F (37 C) (Oral)  09/07/23 98.2 F (36.8 C) (Oral)  09/01/23 97.9 F (36.6 C) (Oral)   BP Readings from Last 3 Encounters:  09/22/23 (!) 180/70  09/07/23  (!) 184/78  09/01/23 (!) 142/80   Pulse Readings from Last 3 Encounters:  09/22/23 68  09/07/23 65  09/01/23 63   Pain Assessment Pain Score: 0-No pain/10  In general this is a well appearing *** in no acute distress. ***'s alert and oriented x4 and appropriate throughout the examination. Cardiopulmonary assessment is negative for acute distress and *** exhibits normal effort.     ECOG = ***  0 - Asymptomatic (Fully active, able to carry on all predisease activities without restriction)  1 - Symptomatic but completely ambulatory (Restricted in physically strenuous activity but ambulatory and able to carry out work of a light or sedentary nature. For example, light housework, office work)  2 - Symptomatic, <50% in bed during the day (Ambulatory and capable of all self care but unable to carry out any work activities. Up and about more than 50% of waking hours)  3 - Symptomatic, >50% in bed, but not bedbound (Capable of only limited self-care, confined to bed or chair 50% or more of waking hours)  4 - Bedbound (Completely disabled. Cannot carry on any self-care. Totally confined to bed or chair)  5 - Death   Santiago Glad MM, Creech RH, Tormey DC, et al. [phone]). "Toxicity and response criteria of the [facility]". Am. Evlyn Clines. Oncol. 5 (6): 649-55    LABORATORY DATA:  Lab Results  Component Value Date   WBC 7.0 07/16/2023   HGB 12.7 07/16/2023   HCT 39.4 07/16/2023   MCV 91.2 07/16/2023   PLT 162 07/16/2023   Lab Results  Component Value Date   NA 139 07/16/2023   K 4.0 07/16/2023   CL 104 07/16/2023   CO2 24 07/16/2023   Lab Results  Component Value Date   ALT 23 07/16/2023   AST 24 07/16/2023   ALKPHOS 72 07/16/2023   BILITOT 0.5 07/16/2023      RADIOGRAPHY: No results found.     IMPRESSION/PLAN: 1.   In a visit lasting *** minutes, greater than 50% of the time was spent face to face discussing the patient's condition, in preparation for  the discussion, and coordinating the patient's care.    Mollie Rossano A. Thersa Salt, MD   **Disclaimer: This note was dictated with voice recognition software. Similar sounding words can inadvertently be transcribed and this note may contain transcription errors which may  not have been corrected upon publication of note.**

## 2023-09-22 NOTE — Progress Notes (Signed)
Face to face visit with pt in exam room. Pt is here for Rad Onc consult. Pt will start radiation after the first of the year. Pt reports that surgery went well but at this point her biggest problem is back issues. Pt will return for sim the first of January.

## 2023-09-23 DIAGNOSIS — C50411 Malignant neoplasm of upper-outer quadrant of right female breast: Secondary | ICD-10-CM | POA: Diagnosis not present

## 2023-09-23 DIAGNOSIS — Z170 Estrogen receptor positive status [ER+]: Secondary | ICD-10-CM | POA: Diagnosis not present

## 2023-09-27 DIAGNOSIS — M9902 Segmental and somatic dysfunction of thoracic region: Secondary | ICD-10-CM | POA: Diagnosis not present

## 2023-09-27 DIAGNOSIS — M9903 Segmental and somatic dysfunction of lumbar region: Secondary | ICD-10-CM | POA: Diagnosis not present

## 2023-09-27 DIAGNOSIS — M62830 Muscle spasm of back: Secondary | ICD-10-CM | POA: Diagnosis not present

## 2023-09-27 DIAGNOSIS — M9905 Segmental and somatic dysfunction of pelvic region: Secondary | ICD-10-CM | POA: Diagnosis not present

## 2023-09-27 DIAGNOSIS — S336XXA Sprain of sacroiliac joint, initial encounter: Secondary | ICD-10-CM | POA: Diagnosis not present

## 2023-10-06 DIAGNOSIS — M9905 Segmental and somatic dysfunction of pelvic region: Secondary | ICD-10-CM | POA: Diagnosis not present

## 2023-10-06 DIAGNOSIS — S336XXA Sprain of sacroiliac joint, initial encounter: Secondary | ICD-10-CM | POA: Diagnosis not present

## 2023-10-06 DIAGNOSIS — M62830 Muscle spasm of back: Secondary | ICD-10-CM | POA: Diagnosis not present

## 2023-10-06 DIAGNOSIS — M9903 Segmental and somatic dysfunction of lumbar region: Secondary | ICD-10-CM | POA: Diagnosis not present

## 2023-10-06 DIAGNOSIS — M9902 Segmental and somatic dysfunction of thoracic region: Secondary | ICD-10-CM | POA: Diagnosis not present

## 2023-10-12 ENCOUNTER — Ambulatory Visit (INDEPENDENT_AMBULATORY_CARE_PROVIDER_SITE_OTHER): Payer: Medicare PPO

## 2023-10-12 DIAGNOSIS — J309 Allergic rhinitis, unspecified: Secondary | ICD-10-CM

## 2023-10-13 DIAGNOSIS — M9902 Segmental and somatic dysfunction of thoracic region: Secondary | ICD-10-CM | POA: Diagnosis not present

## 2023-10-13 DIAGNOSIS — M62830 Muscle spasm of back: Secondary | ICD-10-CM | POA: Diagnosis not present

## 2023-10-13 DIAGNOSIS — M9903 Segmental and somatic dysfunction of lumbar region: Secondary | ICD-10-CM | POA: Diagnosis not present

## 2023-10-13 DIAGNOSIS — M9905 Segmental and somatic dysfunction of pelvic region: Secondary | ICD-10-CM | POA: Diagnosis not present

## 2023-10-13 DIAGNOSIS — S336XXA Sprain of sacroiliac joint, initial encounter: Secondary | ICD-10-CM | POA: Diagnosis not present

## 2023-10-17 DIAGNOSIS — G4733 Obstructive sleep apnea (adult) (pediatric): Secondary | ICD-10-CM | POA: Diagnosis not present

## 2023-10-18 DIAGNOSIS — H2513 Age-related nuclear cataract, bilateral: Secondary | ICD-10-CM | POA: Diagnosis not present

## 2023-10-18 DIAGNOSIS — H43813 Vitreous degeneration, bilateral: Secondary | ICD-10-CM | POA: Diagnosis not present

## 2023-10-18 DIAGNOSIS — H40013 Open angle with borderline findings, low risk, bilateral: Secondary | ICD-10-CM | POA: Diagnosis not present

## 2023-10-20 ENCOUNTER — Ambulatory Visit (HOSPITAL_BASED_OUTPATIENT_CLINIC_OR_DEPARTMENT_OTHER): Payer: Medicare PPO

## 2023-10-20 ENCOUNTER — Ambulatory Visit (HOSPITAL_BASED_OUTPATIENT_CLINIC_OR_DEPARTMENT_OTHER): Payer: Medicare PPO | Admitting: Obstetrics & Gynecology

## 2023-10-20 VITALS — BP 171/78 | HR 67 | Ht 66.0 in | Wt 295.0 lb

## 2023-10-20 DIAGNOSIS — N83202 Unspecified ovarian cyst, left side: Secondary | ICD-10-CM

## 2023-10-23 ENCOUNTER — Encounter (HOSPITAL_BASED_OUTPATIENT_CLINIC_OR_DEPARTMENT_OTHER): Payer: Self-pay | Admitting: Obstetrics & Gynecology

## 2023-10-23 NOTE — Progress Notes (Signed)
GYNECOLOGY  VISIT  CC:   Discuss ultrasound results  HPI: 72 y.o. G0P0000 Married White or Caucasian female here for discussion of ultrasound results.  Ultrasound obtained for ovarian cyst follow up.  Pt has been diagnosed again with breast cancer. Had incision biopsy obtained by Dr. Dwain Sarna.  She still has her oncologst, Dr Gilman Buttner, in [facility].    On ultrasound ,uterus is normal.  Endometrium thin at <2.62mm.  Right ovary not well seen.  Left ovary 2.4 x 2.6 x 2.0cm with cystic lesion measuring 2.3 x 1.5.  I went back several years and looks at prior images.  This is definitely smaller.  Feel reassured about this given decreased size.  She does desire to continue to have yearly ultrasound done.  Will also do pelvic exam next appointment as well.  Pt comfortable with plan.    ALLERGIES: Other, Penicillin g, Zinacef [cefuroxime in sterile water], Fenofibrate, Rosuvastatin, Tape, Augmentin [amoxicillin-pot clavulanate], Desipramine hcl, Silicone, Sulfasalazine, Tetanus antitoxin, Tetanus toxoids, Tetanus-diphtheria toxoids td, Tetanus-diphtheria toxoids td, Thimerosal and related, Avelox [moxifloxacin hcl in nacl], Diclofenac sodium, Diclofenac sodium, Iodine, Moxifloxacin, and Sulfa antibiotics  SH:  married, non smoker  Review of Systems  Constitutional: Negative.   Genitourinary: Negative.     PHYSICAL EXAMINATION:    BP (!) 171/78 (BP Location: Right Arm, Patient Position: Sitting, Cuff Size: Large)   Pulse 67   Ht 5\' 6"  (1.676 m)   Wt 295 lb (133.8 kg)   LMP 11/02/2002 (Approximate)   BMI 47.61 kg/m      Physical Exam Constitutional:      Appearance: She is obese.  Neurological:     Mental Status: She is alert.     Assessment/Plan: 1. Left ovarian cyst (Primary) - will recheck u/s 1 year.  Future order place and will get pt scheduled for this. - US PELVIC COMPLETE WITH TRANSVAGINAL; Future

## 2023-11-08 ENCOUNTER — Ambulatory Visit (INDEPENDENT_AMBULATORY_CARE_PROVIDER_SITE_OTHER): Payer: Medicare PPO | Admitting: *Deleted

## 2023-11-08 DIAGNOSIS — J309 Allergic rhinitis, unspecified: Secondary | ICD-10-CM | POA: Diagnosis not present

## 2023-11-08 DIAGNOSIS — M9902 Segmental and somatic dysfunction of thoracic region: Secondary | ICD-10-CM | POA: Diagnosis not present

## 2023-11-08 DIAGNOSIS — S336XXA Sprain of sacroiliac joint, initial encounter: Secondary | ICD-10-CM | POA: Diagnosis not present

## 2023-11-08 DIAGNOSIS — M9903 Segmental and somatic dysfunction of lumbar region: Secondary | ICD-10-CM | POA: Diagnosis not present

## 2023-11-08 DIAGNOSIS — M9905 Segmental and somatic dysfunction of pelvic region: Secondary | ICD-10-CM | POA: Diagnosis not present

## 2023-11-08 DIAGNOSIS — M62830 Muscle spasm of back: Secondary | ICD-10-CM | POA: Diagnosis not present

## 2023-11-09 ENCOUNTER — Ambulatory Visit
Admission: RE | Admit: 2023-11-09 | Discharge: 2023-11-09 | Disposition: A | Discharge status: Home / Self Care | Payer: Medicare PPO | Source: Ambulatory Visit | Attending: Radiation Oncology | Admitting: Radiation Oncology

## 2023-11-09 DIAGNOSIS — Z170 Estrogen receptor positive status [ER+]: Secondary | ICD-10-CM | POA: Diagnosis not present

## 2023-11-09 DIAGNOSIS — C50912 Malignant neoplasm of unspecified site of left female breast: Secondary | ICD-10-CM | POA: Diagnosis not present

## 2023-11-09 DIAGNOSIS — C50411 Malignant neoplasm of upper-outer quadrant of right female breast: Secondary | ICD-10-CM | POA: Diagnosis not present

## 2023-11-12 DIAGNOSIS — C50912 Malignant neoplasm of unspecified site of left female breast: Secondary | ICD-10-CM | POA: Diagnosis not present

## 2023-11-12 DIAGNOSIS — Z170 Estrogen receptor positive status [ER+]: Secondary | ICD-10-CM | POA: Diagnosis not present

## 2023-11-12 DIAGNOSIS — C50411 Malignant neoplasm of upper-outer quadrant of right female breast: Secondary | ICD-10-CM | POA: Diagnosis not present

## 2023-11-15 DIAGNOSIS — C50912 Malignant neoplasm of unspecified site of left female breast: Secondary | ICD-10-CM | POA: Diagnosis not present

## 2023-11-16 ENCOUNTER — Ambulatory Visit
Admission: RE | Admit: 2023-11-16 | Discharge: 2023-11-16 | Disposition: A | Discharge status: Home / Self Care | Payer: Medicare PPO | Source: Ambulatory Visit | Attending: Radiation Oncology | Admitting: Radiation Oncology

## 2023-11-16 ENCOUNTER — Other Ambulatory Visit: Payer: Self-pay

## 2023-11-16 DIAGNOSIS — E039 Hypothyroidism, unspecified: Secondary | ICD-10-CM | POA: Insufficient documentation

## 2023-11-16 DIAGNOSIS — C50812 Malignant neoplasm of overlapping sites of left female breast: Secondary | ICD-10-CM | POA: Diagnosis not present

## 2023-11-16 DIAGNOSIS — Z510 Encounter for antineoplastic radiation therapy: Secondary | ICD-10-CM | POA: Diagnosis not present

## 2023-11-16 DIAGNOSIS — Z170 Estrogen receptor positive status [ER+]: Secondary | ICD-10-CM | POA: Diagnosis not present

## 2023-11-16 DIAGNOSIS — L602 Onychogryphosis: Secondary | ICD-10-CM | POA: Diagnosis not present

## 2023-11-16 DIAGNOSIS — C50411 Malignant neoplasm of upper-outer quadrant of right female breast: Secondary | ICD-10-CM | POA: Diagnosis not present

## 2023-11-16 LAB — RAD ONC ARIA SESSION SUMMARY
Course Elapsed Days: 0
Plan Fractions Treated to Date: 1
Plan Prescribed Dose Per Fraction: 2.66 Gy
Plan Total Fractions Prescribed: 14
Plan Total Prescribed Dose: 37.24 Gy
Reference Point Dosage Given to Date: 2.66 Gy
Reference Point Session Dosage Given: 2.66 Gy
Session Number: 1

## 2023-11-17 ENCOUNTER — Telehealth: Payer: Self-pay | Admitting: Oncology

## 2023-11-17 ENCOUNTER — Ambulatory Visit
Admission: RE | Admit: 2023-11-17 | Discharge: 2023-11-17 | Disposition: A | Discharge status: Home / Self Care | Payer: Medicare PPO | Source: Ambulatory Visit | Attending: Radiation Oncology | Admitting: Radiation Oncology

## 2023-11-17 ENCOUNTER — Other Ambulatory Visit: Payer: Self-pay | Admitting: Oncology

## 2023-11-17 ENCOUNTER — Inpatient Hospital Stay (HOSPITAL_BASED_OUTPATIENT_CLINIC_OR_DEPARTMENT_OTHER): Payer: Medicare PPO | Admitting: Oncology

## 2023-11-17 ENCOUNTER — Inpatient Hospital Stay: Payer: Medicare PPO | Attending: Oncology

## 2023-11-17 ENCOUNTER — Other Ambulatory Visit: Payer: Self-pay

## 2023-11-17 ENCOUNTER — Encounter: Payer: Self-pay | Admitting: Oncology

## 2023-11-17 VITALS — BP 142/76 | HR 67 | Temp 97.5°F | Resp 18 | Ht 66.0 in | Wt 294.2 lb

## 2023-11-17 DIAGNOSIS — Z170 Estrogen receptor positive status [ER+]: Secondary | ICD-10-CM

## 2023-11-17 DIAGNOSIS — N83209 Unspecified ovarian cyst, unspecified side: Secondary | ICD-10-CM | POA: Insufficient documentation

## 2023-11-17 DIAGNOSIS — Z780 Asymptomatic menopausal state: Secondary | ICD-10-CM

## 2023-11-17 DIAGNOSIS — Z79811 Long term (current) use of aromatase inhibitors: Secondary | ICD-10-CM | POA: Insufficient documentation

## 2023-11-17 DIAGNOSIS — C50812 Malignant neoplasm of overlapping sites of left female breast: Secondary | ICD-10-CM | POA: Insufficient documentation

## 2023-11-17 DIAGNOSIS — M8580 Other specified disorders of bone density and structure, unspecified site: Secondary | ICD-10-CM

## 2023-11-17 DIAGNOSIS — E782 Mixed hyperlipidemia: Secondary | ICD-10-CM

## 2023-11-17 DIAGNOSIS — C50212 Malignant neoplasm of upper-inner quadrant of left female breast: Secondary | ICD-10-CM | POA: Diagnosis not present

## 2023-11-17 DIAGNOSIS — E039 Hypothyroidism, unspecified: Secondary | ICD-10-CM | POA: Insufficient documentation

## 2023-11-17 DIAGNOSIS — M654 Radial styloid tenosynovitis [de Quervain]: Secondary | ICD-10-CM | POA: Insufficient documentation

## 2023-11-17 DIAGNOSIS — C50411 Malignant neoplasm of upper-outer quadrant of right female breast: Secondary | ICD-10-CM | POA: Diagnosis not present

## 2023-11-17 DIAGNOSIS — Z510 Encounter for antineoplastic radiation therapy: Secondary | ICD-10-CM | POA: Diagnosis not present

## 2023-11-17 LAB — CMP (CANCER CENTER ONLY)
ALT: 41 U/L (ref 0–44)
AST: 34 U/L (ref 15–41)
Albumin: 4.3 g/dL (ref 3.5–5.0)
Alkaline Phosphatase: 108 U/L (ref 38–126)
Anion gap: 11 (ref 5–15)
BUN: 17 mg/dL (ref 8–23)
CO2: 25 mmol/L (ref 22–32)
Calcium: 9.8 mg/dL (ref 8.9–10.3)
Chloride: 104 mmol/L (ref 98–111)
Creatinine: 0.77 mg/dL (ref 0.44–1.00)
GFR, Estimated: >60 mL/min (ref >60)
Glucose, Bld: 108 mg/dL — ABNORMAL HIGH (ref 70–99)
Potassium: 4.1 mmol/L (ref 3.5–5.1)
Sodium: 140 mmol/L (ref 135–145)
Total Bilirubin: 0.3 mg/dL (ref 0.0–1.2)
Total Protein: 7.7 g/dL (ref 6.5–8.1)

## 2023-11-17 LAB — CBC WITH DIFFERENTIAL (CANCER CENTER ONLY)
Abs Immature Granulocytes: 0.02 10*3/uL (ref 0.00–0.07)
Basophils Absolute: 0 10*3/uL (ref 0.0–0.1)
Basophils Relative: 0 %
Eosinophils Absolute: 0.1 10*3/uL (ref 0.0–0.5)
Eosinophils Relative: 1 %
HCT: 36.9 % (ref 36.0–46.0)
Hemoglobin: 12.5 g/dL (ref 12.0–15.0)
Immature Granulocytes: 0 %
Lymphocytes Relative: 29 %
Lymphs Abs: 1.7 10*3/uL (ref 0.7–4.0)
MCH: 30.1 pg (ref 26.0–34.0)
MCHC: 33.9 g/dL (ref 30.0–36.0)
MCV: 88.9 fL (ref 80.0–100.0)
Monocytes Absolute: 0.4 10*3/uL (ref 0.1–1.0)
Monocytes Relative: 7 %
Neutro Abs: 3.8 10*3/uL (ref 1.7–7.7)
Neutrophils Relative %: 63 %
Platelet Count: 287 10*3/uL (ref 150–400)
RBC: 4.15 MIL/uL (ref 3.87–5.11)
RDW: 13.2 % (ref 11.5–15.5)
WBC Count: 6 10*3/uL (ref 4.0–10.5)
nRBC: 0 % (ref 0.0–0.2)
nRBC: 0 /100{WBCs}

## 2023-11-17 LAB — RAD ONC ARIA SESSION SUMMARY
Course Elapsed Days: 1
Plan Fractions Treated to Date: 2
Plan Prescribed Dose Per Fraction: 2.66 Gy
Plan Total Fractions Prescribed: 14
Plan Total Prescribed Dose: 37.24 Gy
Reference Point Dosage Given to Date: 5.32 Gy
Reference Point Session Dosage Given: 2.66 Gy
Session Number: 2

## 2023-11-17 LAB — TSH: TSH: 2.006 u[IU]/mL (ref 0.350–4.500)

## 2023-11-17 MED ORDER — LETROZOLE 2.5 MG PO TABS
2.5000 mg | ORAL_TABLET | Freq: Every day | ORAL | 5 refills | Status: DC
Start: 2023-11-17 — End: 2024-05-10
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
## 2023-11-17 NOTE — Progress Notes (Signed)
 [facility]. Pt is here for Med Onc follow up and rad tx. Pt began her rad tx 's yesterday. Other than mobility issues with her sciatica, she denies any other complaints.

## 2023-11-17 NOTE — Telephone Encounter (Signed)
 11/17/23 Next appt scheduled and confirmed with patient.

## 2023-11-18 ENCOUNTER — Ambulatory Visit
Admission: RE | Admit: 2023-11-18 | Discharge: 2023-11-18 | Disposition: A | Discharge status: Home / Self Care | Payer: Medicare PPO | Source: Ambulatory Visit | Attending: Radiation Oncology | Admitting: Radiation Oncology

## 2023-11-18 ENCOUNTER — Other Ambulatory Visit: Payer: Self-pay

## 2023-11-18 DIAGNOSIS — Z170 Estrogen receptor positive status [ER+]: Secondary | ICD-10-CM | POA: Diagnosis not present

## 2023-11-18 DIAGNOSIS — Z510 Encounter for antineoplastic radiation therapy: Secondary | ICD-10-CM | POA: Diagnosis not present

## 2023-11-18 DIAGNOSIS — C50411 Malignant neoplasm of upper-outer quadrant of right female breast: Secondary | ICD-10-CM | POA: Diagnosis not present

## 2023-11-18 DIAGNOSIS — E039 Hypothyroidism, unspecified: Secondary | ICD-10-CM | POA: Diagnosis not present

## 2023-11-18 DIAGNOSIS — C50812 Malignant neoplasm of overlapping sites of left female breast: Secondary | ICD-10-CM | POA: Diagnosis not present

## 2023-11-18 LAB — RAD ONC ARIA SESSION SUMMARY
Course Elapsed Days: 2
Plan Fractions Treated to Date: 3
Plan Prescribed Dose Per Fraction: 2.66 Gy
Plan Total Fractions Prescribed: 14
Plan Total Prescribed Dose: 37.24 Gy
Reference Point Dosage Given to Date: 7.98 Gy
Reference Point Session Dosage Given: 2.66 Gy
Session Number: 3

## 2023-11-18 LAB — T4: T4, Total: 9.5 ug/dL (ref 4.5–12.0)

## 2023-11-19 ENCOUNTER — Other Ambulatory Visit: Payer: Self-pay

## 2023-11-19 ENCOUNTER — Ambulatory Visit
Admission: RE | Admit: 2023-11-19 | Discharge: 2023-11-19 | Disposition: A | Discharge status: Home / Self Care | Payer: Medicare PPO | Source: Ambulatory Visit | Attending: Radiation Oncology | Admitting: Radiation Oncology

## 2023-11-19 DIAGNOSIS — E039 Hypothyroidism, unspecified: Secondary | ICD-10-CM | POA: Diagnosis not present

## 2023-11-19 DIAGNOSIS — Z170 Estrogen receptor positive status [ER+]: Secondary | ICD-10-CM | POA: Diagnosis not present

## 2023-11-19 DIAGNOSIS — C50411 Malignant neoplasm of upper-outer quadrant of right female breast: Secondary | ICD-10-CM | POA: Diagnosis not present

## 2023-11-19 DIAGNOSIS — C50812 Malignant neoplasm of overlapping sites of left female breast: Secondary | ICD-10-CM | POA: Diagnosis not present

## 2023-11-19 DIAGNOSIS — Z510 Encounter for antineoplastic radiation therapy: Secondary | ICD-10-CM | POA: Diagnosis not present

## 2023-11-19 LAB — RAD ONC ARIA SESSION SUMMARY
Course Elapsed Days: 3
Plan Fractions Treated to Date: 4
Plan Prescribed Dose Per Fraction: 2.66 Gy
Plan Total Fractions Prescribed: 14
Plan Total Prescribed Dose: 37.24 Gy
Reference Point Dosage Given to Date: 10.64 Gy
Reference Point Session Dosage Given: 2.66 Gy
Session Number: 4

## 2023-11-22 ENCOUNTER — Ambulatory Visit
Admission: RE | Admit: 2023-11-22 | Discharge: 2023-11-22 | Disposition: A | Discharge status: Home / Self Care | Payer: Medicare PPO | Source: Ambulatory Visit | Attending: Radiation Oncology

## 2023-11-22 ENCOUNTER — Encounter: Payer: Self-pay | Admitting: Oncology

## 2023-11-22 ENCOUNTER — Other Ambulatory Visit: Payer: Self-pay

## 2023-11-22 DIAGNOSIS — Z170 Estrogen receptor positive status [ER+]: Secondary | ICD-10-CM | POA: Diagnosis not present

## 2023-11-22 DIAGNOSIS — E039 Hypothyroidism, unspecified: Secondary | ICD-10-CM | POA: Diagnosis not present

## 2023-11-22 DIAGNOSIS — Z510 Encounter for antineoplastic radiation therapy: Secondary | ICD-10-CM | POA: Diagnosis not present

## 2023-11-22 DIAGNOSIS — C50812 Malignant neoplasm of overlapping sites of left female breast: Secondary | ICD-10-CM | POA: Diagnosis not present

## 2023-11-22 DIAGNOSIS — C50411 Malignant neoplasm of upper-outer quadrant of right female breast: Secondary | ICD-10-CM | POA: Diagnosis not present

## 2023-11-22 LAB — RAD ONC ARIA SESSION SUMMARY
Course Elapsed Days: 6
Plan Fractions Treated to Date: 5
Plan Prescribed Dose Per Fraction: 2.66 Gy
Plan Total Fractions Prescribed: 14
Plan Total Prescribed Dose: 37.24 Gy
Reference Point Dosage Given to Date: 13.3 Gy
Reference Point Session Dosage Given: 2.66 Gy
Session Number: 5

## 2023-11-23 ENCOUNTER — Ambulatory Visit
Admission: RE | Admit: 2023-11-23 | Discharge: 2023-11-23 | Disposition: A | Discharge status: Home / Self Care | Payer: Medicare PPO | Source: Ambulatory Visit | Attending: Radiation Oncology

## 2023-11-23 ENCOUNTER — Ambulatory Visit
Admission: RE | Admit: 2023-11-23 | Discharge: 2023-11-23 | Disposition: A | Discharge status: Home / Self Care | Payer: Medicare PPO | Source: Ambulatory Visit | Attending: Radiation Oncology | Admitting: Radiation Oncology

## 2023-11-23 ENCOUNTER — Other Ambulatory Visit: Payer: Self-pay

## 2023-11-23 DIAGNOSIS — Z170 Estrogen receptor positive status [ER+]: Secondary | ICD-10-CM | POA: Diagnosis not present

## 2023-11-23 DIAGNOSIS — C50812 Malignant neoplasm of overlapping sites of left female breast: Secondary | ICD-10-CM | POA: Diagnosis not present

## 2023-11-23 DIAGNOSIS — C50411 Malignant neoplasm of upper-outer quadrant of right female breast: Secondary | ICD-10-CM | POA: Diagnosis not present

## 2023-11-23 DIAGNOSIS — E039 Hypothyroidism, unspecified: Secondary | ICD-10-CM | POA: Diagnosis not present

## 2023-11-23 DIAGNOSIS — Z510 Encounter for antineoplastic radiation therapy: Secondary | ICD-10-CM | POA: Diagnosis not present

## 2023-11-23 LAB — RAD ONC ARIA SESSION SUMMARY
Course Elapsed Days: 7
Plan Fractions Treated to Date: 6
Plan Prescribed Dose Per Fraction: 2.66 Gy
Plan Total Fractions Prescribed: 14
Plan Total Prescribed Dose: 37.24 Gy
Reference Point Dosage Given to Date: 15.96 Gy
Reference Point Session Dosage Given: 0.7322 Gy
Session Number: 6

## 2023-11-24 ENCOUNTER — Other Ambulatory Visit: Payer: Self-pay

## 2023-11-24 ENCOUNTER — Ambulatory Visit
Admission: RE | Admit: 2023-11-24 | Discharge: 2023-11-24 | Disposition: A | Discharge status: Home / Self Care | Payer: Medicare PPO | Source: Ambulatory Visit | Attending: Radiation Oncology

## 2023-11-24 DIAGNOSIS — E039 Hypothyroidism, unspecified: Secondary | ICD-10-CM | POA: Diagnosis not present

## 2023-11-24 DIAGNOSIS — C50812 Malignant neoplasm of overlapping sites of left female breast: Secondary | ICD-10-CM | POA: Diagnosis not present

## 2023-11-24 DIAGNOSIS — Z510 Encounter for antineoplastic radiation therapy: Secondary | ICD-10-CM | POA: Diagnosis not present

## 2023-11-24 DIAGNOSIS — Z170 Estrogen receptor positive status [ER+]: Secondary | ICD-10-CM | POA: Diagnosis not present

## 2023-11-24 DIAGNOSIS — C50411 Malignant neoplasm of upper-outer quadrant of right female breast: Secondary | ICD-10-CM | POA: Diagnosis not present

## 2023-11-24 LAB — RAD ONC ARIA SESSION SUMMARY
Course Elapsed Days: 8
Plan Fractions Treated to Date: 7
Plan Prescribed Dose Per Fraction: 2.66 Gy
Plan Total Fractions Prescribed: 14
Plan Total Prescribed Dose: 37.24 Gy
Reference Point Dosage Given to Date: 18.62 Gy
Reference Point Session Dosage Given: 2.66 Gy
Session Number: 7

## 2023-11-25 ENCOUNTER — Other Ambulatory Visit: Payer: Self-pay

## 2023-11-25 ENCOUNTER — Ambulatory Visit
Admission: RE | Admit: 2023-11-25 | Discharge: 2023-11-25 | Disposition: A | Discharge status: Home / Self Care | Payer: Medicare PPO | Source: Ambulatory Visit | Attending: Radiation Oncology | Admitting: Radiation Oncology

## 2023-11-25 DIAGNOSIS — C50812 Malignant neoplasm of overlapping sites of left female breast: Secondary | ICD-10-CM | POA: Diagnosis not present

## 2023-11-25 DIAGNOSIS — Z170 Estrogen receptor positive status [ER+]: Secondary | ICD-10-CM | POA: Diagnosis not present

## 2023-11-25 DIAGNOSIS — Z510 Encounter for antineoplastic radiation therapy: Secondary | ICD-10-CM | POA: Diagnosis not present

## 2023-11-25 DIAGNOSIS — C50411 Malignant neoplasm of upper-outer quadrant of right female breast: Secondary | ICD-10-CM | POA: Diagnosis not present

## 2023-11-25 DIAGNOSIS — E039 Hypothyroidism, unspecified: Secondary | ICD-10-CM | POA: Diagnosis not present

## 2023-11-25 LAB — RAD ONC ARIA SESSION SUMMARY
Course Elapsed Days: 9
Plan Fractions Treated to Date: 8
Plan Prescribed Dose Per Fraction: 2.66 Gy
Plan Total Fractions Prescribed: 14
Plan Total Prescribed Dose: 37.24 Gy
Reference Point Dosage Given to Date: 21.28 Gy
Reference Point Session Dosage Given: 2.66 Gy
Session Number: 8

## 2023-11-26 ENCOUNTER — Other Ambulatory Visit: Payer: Self-pay

## 2023-11-26 ENCOUNTER — Ambulatory Visit
Admission: RE | Admit: 2023-11-26 | Discharge: 2023-11-26 | Disposition: A | Discharge status: Home / Self Care | Payer: Medicare PPO | Source: Ambulatory Visit | Attending: Radiation Oncology | Admitting: Radiation Oncology

## 2023-11-26 DIAGNOSIS — E039 Hypothyroidism, unspecified: Secondary | ICD-10-CM | POA: Diagnosis not present

## 2023-11-26 DIAGNOSIS — C50812 Malignant neoplasm of overlapping sites of left female breast: Secondary | ICD-10-CM | POA: Diagnosis not present

## 2023-11-26 DIAGNOSIS — Z170 Estrogen receptor positive status [ER+]: Secondary | ICD-10-CM | POA: Diagnosis not present

## 2023-11-26 DIAGNOSIS — Z510 Encounter for antineoplastic radiation therapy: Secondary | ICD-10-CM | POA: Diagnosis not present

## 2023-11-26 LAB — RAD ONC ARIA SESSION SUMMARY
Course Elapsed Days: 10
Plan Fractions Treated to Date: 9
Plan Prescribed Dose Per Fraction: 2.66 Gy
Plan Total Fractions Prescribed: 14
Plan Total Prescribed Dose: 37.24 Gy
Reference Point Dosage Given to Date: 23.94 Gy
Reference Point Session Dosage Given: 2.66 Gy
Session Number: 9

## 2023-11-29 ENCOUNTER — Ambulatory Visit
Admission: RE | Admit: 2023-11-29 | Discharge: 2023-11-29 | Disposition: A | Discharge status: Home / Self Care | Payer: Medicare PPO | Source: Ambulatory Visit | Attending: Radiation Oncology

## 2023-11-29 ENCOUNTER — Other Ambulatory Visit: Payer: Self-pay

## 2023-11-29 DIAGNOSIS — Z510 Encounter for antineoplastic radiation therapy: Secondary | ICD-10-CM | POA: Diagnosis not present

## 2023-11-29 DIAGNOSIS — E039 Hypothyroidism, unspecified: Secondary | ICD-10-CM | POA: Diagnosis not present

## 2023-11-29 DIAGNOSIS — C50411 Malignant neoplasm of upper-outer quadrant of right female breast: Secondary | ICD-10-CM | POA: Diagnosis not present

## 2023-11-29 DIAGNOSIS — Z170 Estrogen receptor positive status [ER+]: Secondary | ICD-10-CM | POA: Diagnosis not present

## 2023-11-29 DIAGNOSIS — C50812 Malignant neoplasm of overlapping sites of left female breast: Secondary | ICD-10-CM | POA: Diagnosis not present

## 2023-11-29 LAB — RAD ONC ARIA SESSION SUMMARY
Course Elapsed Days: 13
Plan Fractions Treated to Date: 10
Plan Prescribed Dose Per Fraction: 2.66 Gy
Plan Total Fractions Prescribed: 14
Plan Total Prescribed Dose: 37.24 Gy
Reference Point Dosage Given to Date: 26.6 Gy
Reference Point Session Dosage Given: 2.66 Gy
Session Number: 10

## 2023-11-30 ENCOUNTER — Other Ambulatory Visit: Payer: Self-pay

## 2023-11-30 ENCOUNTER — Ambulatory Visit
Admission: RE | Admit: 2023-11-30 | Discharge: 2023-11-30 | Disposition: A | Discharge status: Home / Self Care | Payer: Medicare PPO | Source: Ambulatory Visit | Attending: Radiation Oncology | Admitting: Radiation Oncology

## 2023-11-30 DIAGNOSIS — C50411 Malignant neoplasm of upper-outer quadrant of right female breast: Secondary | ICD-10-CM | POA: Diagnosis not present

## 2023-11-30 DIAGNOSIS — C50812 Malignant neoplasm of overlapping sites of left female breast: Secondary | ICD-10-CM | POA: Diagnosis not present

## 2023-11-30 DIAGNOSIS — E039 Hypothyroidism, unspecified: Secondary | ICD-10-CM | POA: Diagnosis not present

## 2023-11-30 DIAGNOSIS — Z510 Encounter for antineoplastic radiation therapy: Secondary | ICD-10-CM | POA: Diagnosis not present

## 2023-11-30 DIAGNOSIS — Z170 Estrogen receptor positive status [ER+]: Secondary | ICD-10-CM | POA: Diagnosis not present

## 2023-11-30 LAB — RAD ONC ARIA SESSION SUMMARY
Course Elapsed Days: 14
Plan Fractions Treated to Date: 11
Plan Prescribed Dose Per Fraction: 2.66 Gy
Plan Total Fractions Prescribed: 14
Plan Total Prescribed Dose: 37.24 Gy
Reference Point Dosage Given to Date: 29.26 Gy
Reference Point Session Dosage Given: 2.66 Gy
Session Number: 11

## 2023-12-01 ENCOUNTER — Other Ambulatory Visit: Payer: Self-pay

## 2023-12-01 ENCOUNTER — Ambulatory Visit
Admission: RE | Admit: 2023-12-01 | Discharge: 2023-12-01 | Disposition: A | Discharge status: Home / Self Care | Payer: Medicare PPO | Source: Ambulatory Visit | Attending: Radiation Oncology

## 2023-12-01 DIAGNOSIS — Z170 Estrogen receptor positive status [ER+]: Secondary | ICD-10-CM | POA: Diagnosis not present

## 2023-12-01 DIAGNOSIS — C50411 Malignant neoplasm of upper-outer quadrant of right female breast: Secondary | ICD-10-CM | POA: Diagnosis not present

## 2023-12-01 DIAGNOSIS — C50812 Malignant neoplasm of overlapping sites of left female breast: Secondary | ICD-10-CM | POA: Diagnosis not present

## 2023-12-01 DIAGNOSIS — C50912 Malignant neoplasm of unspecified site of left female breast: Secondary | ICD-10-CM | POA: Diagnosis not present

## 2023-12-01 DIAGNOSIS — Z510 Encounter for antineoplastic radiation therapy: Secondary | ICD-10-CM | POA: Diagnosis not present

## 2023-12-01 DIAGNOSIS — E039 Hypothyroidism, unspecified: Secondary | ICD-10-CM | POA: Diagnosis not present

## 2023-12-01 LAB — RAD ONC ARIA SESSION SUMMARY
Course Elapsed Days: 15
Plan Fractions Treated to Date: 12
Plan Prescribed Dose Per Fraction: 2.66 Gy
Plan Total Fractions Prescribed: 14
Plan Total Prescribed Dose: 37.24 Gy
Reference Point Dosage Given to Date: 31.92 Gy
Reference Point Session Dosage Given: 2.66 Gy
Session Number: 12

## 2023-12-02 ENCOUNTER — Other Ambulatory Visit: Payer: Self-pay

## 2023-12-02 ENCOUNTER — Ambulatory Visit
Admission: RE | Admit: 2023-12-02 | Discharge: 2023-12-02 | Disposition: A | Discharge status: Home / Self Care | Payer: Medicare PPO | Source: Ambulatory Visit | Attending: Radiation Oncology | Admitting: Radiation Oncology

## 2023-12-02 DIAGNOSIS — M62830 Muscle spasm of back: Secondary | ICD-10-CM | POA: Diagnosis not present

## 2023-12-02 DIAGNOSIS — M9903 Segmental and somatic dysfunction of lumbar region: Secondary | ICD-10-CM | POA: Diagnosis not present

## 2023-12-02 DIAGNOSIS — C50411 Malignant neoplasm of upper-outer quadrant of right female breast: Secondary | ICD-10-CM | POA: Diagnosis not present

## 2023-12-02 DIAGNOSIS — M9905 Segmental and somatic dysfunction of pelvic region: Secondary | ICD-10-CM | POA: Diagnosis not present

## 2023-12-02 DIAGNOSIS — Z510 Encounter for antineoplastic radiation therapy: Secondary | ICD-10-CM | POA: Diagnosis not present

## 2023-12-02 DIAGNOSIS — E039 Hypothyroidism, unspecified: Secondary | ICD-10-CM | POA: Diagnosis not present

## 2023-12-02 DIAGNOSIS — C50812 Malignant neoplasm of overlapping sites of left female breast: Secondary | ICD-10-CM | POA: Diagnosis not present

## 2023-12-02 DIAGNOSIS — M9902 Segmental and somatic dysfunction of thoracic region: Secondary | ICD-10-CM | POA: Diagnosis not present

## 2023-12-02 DIAGNOSIS — Z170 Estrogen receptor positive status [ER+]: Secondary | ICD-10-CM | POA: Diagnosis not present

## 2023-12-02 DIAGNOSIS — S336XXA Sprain of sacroiliac joint, initial encounter: Secondary | ICD-10-CM | POA: Diagnosis not present

## 2023-12-02 LAB — RAD ONC ARIA SESSION SUMMARY
Course Elapsed Days: 16
Plan Fractions Treated to Date: 13
Plan Prescribed Dose Per Fraction: 2.66 Gy
Plan Total Fractions Prescribed: 14
Plan Total Prescribed Dose: 37.24 Gy
Reference Point Dosage Given to Date: 34.58 Gy
Reference Point Session Dosage Given: 2.66 Gy
Session Number: 13

## 2023-12-03 ENCOUNTER — Ambulatory Visit
Admission: RE | Admit: 2023-12-03 | Discharge: 2023-12-03 | Disposition: A | Discharge status: Home / Self Care | Payer: Medicare PPO | Source: Ambulatory Visit | Attending: Radiation Oncology

## 2023-12-03 ENCOUNTER — Other Ambulatory Visit: Payer: Self-pay

## 2023-12-03 DIAGNOSIS — Z170 Estrogen receptor positive status [ER+]: Secondary | ICD-10-CM | POA: Diagnosis not present

## 2023-12-03 DIAGNOSIS — E039 Hypothyroidism, unspecified: Secondary | ICD-10-CM | POA: Diagnosis not present

## 2023-12-03 DIAGNOSIS — C50411 Malignant neoplasm of upper-outer quadrant of right female breast: Secondary | ICD-10-CM | POA: Diagnosis not present

## 2023-12-03 DIAGNOSIS — Z510 Encounter for antineoplastic radiation therapy: Secondary | ICD-10-CM | POA: Diagnosis not present

## 2023-12-03 DIAGNOSIS — C50812 Malignant neoplasm of overlapping sites of left female breast: Secondary | ICD-10-CM | POA: Diagnosis not present

## 2023-12-03 LAB — RAD ONC ARIA SESSION SUMMARY
Course Elapsed Days: 17
Plan Fractions Treated to Date: 14
Plan Prescribed Dose Per Fraction: 2.66 Gy
Plan Total Fractions Prescribed: 14
Plan Total Prescribed Dose: 37.24 Gy
Reference Point Dosage Given to Date: 37.24 Gy
Reference Point Session Dosage Given: 2.66 Gy
Session Number: 14

## 2023-12-06 ENCOUNTER — Ambulatory Visit: Payer: Medicare PPO | Admitting: Radiation Oncology

## 2023-12-06 ENCOUNTER — Ambulatory Visit
Admission: RE | Admit: 2023-12-06 | Discharge: 2023-12-06 | Disposition: A | Discharge status: Home / Self Care | Payer: Medicare PPO | Source: Ambulatory Visit | Attending: Radiation Oncology | Admitting: Radiation Oncology

## 2023-12-06 ENCOUNTER — Other Ambulatory Visit: Payer: Self-pay

## 2023-12-06 DIAGNOSIS — Z170 Estrogen receptor positive status [ER+]: Secondary | ICD-10-CM | POA: Insufficient documentation

## 2023-12-06 DIAGNOSIS — Z510 Encounter for antineoplastic radiation therapy: Secondary | ICD-10-CM | POA: Insufficient documentation

## 2023-12-06 DIAGNOSIS — E039 Hypothyroidism, unspecified: Secondary | ICD-10-CM | POA: Diagnosis not present

## 2023-12-06 DIAGNOSIS — C50812 Malignant neoplasm of overlapping sites of left female breast: Secondary | ICD-10-CM | POA: Insufficient documentation

## 2023-12-06 DIAGNOSIS — C50411 Malignant neoplasm of upper-outer quadrant of right female breast: Secondary | ICD-10-CM | POA: Diagnosis not present

## 2023-12-06 LAB — RAD ONC ARIA SESSION SUMMARY
Course Elapsed Days: 20
Plan Fractions Treated to Date: 1
Plan Prescribed Dose Per Fraction: 2 Gy
Plan Total Fractions Prescribed: 7
Plan Total Prescribed Dose: 14 Gy
Reference Point Dosage Given to Date: 2 Gy
Reference Point Session Dosage Given: 2 Gy
Session Number: 15

## 2023-12-07 ENCOUNTER — Ambulatory Visit
Admission: RE | Admit: 2023-12-07 | Discharge: 2023-12-07 | Disposition: A | Discharge status: Home / Self Care | Payer: Medicare PPO | Source: Ambulatory Visit | Attending: Radiation Oncology | Admitting: Radiation Oncology

## 2023-12-07 ENCOUNTER — Other Ambulatory Visit: Payer: Self-pay

## 2023-12-07 DIAGNOSIS — Z510 Encounter for antineoplastic radiation therapy: Secondary | ICD-10-CM | POA: Diagnosis not present

## 2023-12-07 DIAGNOSIS — C50812 Malignant neoplasm of overlapping sites of left female breast: Secondary | ICD-10-CM | POA: Diagnosis not present

## 2023-12-07 DIAGNOSIS — E039 Hypothyroidism, unspecified: Secondary | ICD-10-CM | POA: Diagnosis not present

## 2023-12-07 DIAGNOSIS — C50411 Malignant neoplasm of upper-outer quadrant of right female breast: Secondary | ICD-10-CM | POA: Diagnosis not present

## 2023-12-07 DIAGNOSIS — Z170 Estrogen receptor positive status [ER+]: Secondary | ICD-10-CM | POA: Diagnosis not present

## 2023-12-07 LAB — RAD ONC ARIA SESSION SUMMARY
Course Elapsed Days: 21
Plan Fractions Treated to Date: 2
Plan Prescribed Dose Per Fraction: 2 Gy
Plan Total Fractions Prescribed: 7
Plan Total Prescribed Dose: 14 Gy
Reference Point Dosage Given to Date: 4 Gy
Reference Point Session Dosage Given: 2 Gy
Session Number: 16

## 2023-12-08 ENCOUNTER — Ambulatory Visit
Admission: RE | Admit: 2023-12-08 | Discharge: 2023-12-08 | Disposition: A | Discharge status: Home / Self Care | Payer: Medicare PPO | Source: Ambulatory Visit | Attending: Radiation Oncology | Admitting: Radiation Oncology

## 2023-12-08 ENCOUNTER — Other Ambulatory Visit: Payer: Self-pay

## 2023-12-08 ENCOUNTER — Ambulatory Visit: Payer: Medicare PPO | Admitting: Radiation Oncology

## 2023-12-08 DIAGNOSIS — Z510 Encounter for antineoplastic radiation therapy: Secondary | ICD-10-CM | POA: Diagnosis not present

## 2023-12-08 DIAGNOSIS — Z170 Estrogen receptor positive status [ER+]: Secondary | ICD-10-CM | POA: Diagnosis not present

## 2023-12-08 DIAGNOSIS — C50812 Malignant neoplasm of overlapping sites of left female breast: Secondary | ICD-10-CM | POA: Diagnosis not present

## 2023-12-08 DIAGNOSIS — E039 Hypothyroidism, unspecified: Secondary | ICD-10-CM | POA: Diagnosis not present

## 2023-12-08 LAB — RAD ONC ARIA SESSION SUMMARY
Course Elapsed Days: 22
Plan Fractions Treated to Date: 3
Plan Prescribed Dose Per Fraction: 2 Gy
Plan Total Fractions Prescribed: 7
Plan Total Prescribed Dose: 14 Gy
Reference Point Dosage Given to Date: 6 Gy
Reference Point Session Dosage Given: 2 Gy
Session Number: 17

## 2023-12-09 ENCOUNTER — Ambulatory Visit
Admission: RE | Admit: 2023-12-09 | Discharge: 2023-12-09 | Disposition: A | Discharge status: Home / Self Care | Payer: Medicare PPO | Source: Ambulatory Visit | Attending: Radiation Oncology | Admitting: Radiation Oncology

## 2023-12-09 ENCOUNTER — Other Ambulatory Visit: Payer: Self-pay

## 2023-12-09 DIAGNOSIS — C50812 Malignant neoplasm of overlapping sites of left female breast: Secondary | ICD-10-CM | POA: Diagnosis not present

## 2023-12-09 DIAGNOSIS — Z170 Estrogen receptor positive status [ER+]: Secondary | ICD-10-CM | POA: Diagnosis not present

## 2023-12-09 DIAGNOSIS — Z510 Encounter for antineoplastic radiation therapy: Secondary | ICD-10-CM | POA: Diagnosis not present

## 2023-12-09 DIAGNOSIS — E039 Hypothyroidism, unspecified: Secondary | ICD-10-CM | POA: Diagnosis not present

## 2023-12-09 LAB — RAD ONC ARIA SESSION SUMMARY
Course Elapsed Days: 23
Plan Fractions Treated to Date: 4
Plan Prescribed Dose Per Fraction: 2 Gy
Plan Total Fractions Prescribed: 7
Plan Total Prescribed Dose: 14 Gy
Reference Point Dosage Given to Date: 8 Gy
Reference Point Session Dosage Given: 2 Gy
Session Number: 18

## 2023-12-10 ENCOUNTER — Other Ambulatory Visit: Payer: Self-pay

## 2023-12-10 ENCOUNTER — Ambulatory Visit
Admission: RE | Admit: 2023-12-10 | Discharge: 2023-12-10 | Disposition: A | Discharge status: Home / Self Care | Payer: Medicare PPO | Source: Ambulatory Visit | Attending: Radiation Oncology

## 2023-12-10 DIAGNOSIS — E039 Hypothyroidism, unspecified: Secondary | ICD-10-CM | POA: Diagnosis not present

## 2023-12-10 DIAGNOSIS — Z170 Estrogen receptor positive status [ER+]: Secondary | ICD-10-CM | POA: Diagnosis not present

## 2023-12-10 DIAGNOSIS — Z510 Encounter for antineoplastic radiation therapy: Secondary | ICD-10-CM | POA: Diagnosis not present

## 2023-12-10 DIAGNOSIS — C50812 Malignant neoplasm of overlapping sites of left female breast: Secondary | ICD-10-CM | POA: Diagnosis not present

## 2023-12-10 LAB — RAD ONC ARIA SESSION SUMMARY
Course Elapsed Days: 24
Plan Fractions Treated to Date: 5
Plan Prescribed Dose Per Fraction: 2 Gy
Plan Total Fractions Prescribed: 7
Plan Total Prescribed Dose: 14 Gy
Reference Point Dosage Given to Date: 10 Gy
Reference Point Session Dosage Given: 2 Gy
Session Number: 19

## 2023-12-13 ENCOUNTER — Ambulatory Visit
Admission: RE | Admit: 2023-12-13 | Discharge: 2023-12-13 | Disposition: A | Discharge status: Home / Self Care | Payer: Medicare PPO | Source: Ambulatory Visit | Attending: Radiation Oncology | Admitting: Radiation Oncology

## 2023-12-13 ENCOUNTER — Ambulatory Visit (INDEPENDENT_AMBULATORY_CARE_PROVIDER_SITE_OTHER): Payer: Self-pay | Admitting: *Deleted

## 2023-12-13 ENCOUNTER — Other Ambulatory Visit: Payer: Self-pay

## 2023-12-13 ENCOUNTER — Ambulatory Visit
Admission: RE | Admit: 2023-12-13 | Discharge: 2023-12-13 | Disposition: A | Discharge status: Home / Self Care | Payer: Medicare PPO | Source: Ambulatory Visit | Attending: Radiation Oncology

## 2023-12-13 DIAGNOSIS — Z510 Encounter for antineoplastic radiation therapy: Secondary | ICD-10-CM | POA: Diagnosis not present

## 2023-12-13 DIAGNOSIS — J309 Allergic rhinitis, unspecified: Secondary | ICD-10-CM

## 2023-12-13 DIAGNOSIS — C50812 Malignant neoplasm of overlapping sites of left female breast: Secondary | ICD-10-CM | POA: Diagnosis not present

## 2023-12-13 DIAGNOSIS — E039 Hypothyroidism, unspecified: Secondary | ICD-10-CM | POA: Diagnosis not present

## 2023-12-13 DIAGNOSIS — Z170 Estrogen receptor positive status [ER+]: Secondary | ICD-10-CM | POA: Diagnosis not present

## 2023-12-13 LAB — RAD ONC ARIA SESSION SUMMARY
Course Elapsed Days: 27
Plan Fractions Treated to Date: 6
Plan Prescribed Dose Per Fraction: 2 Gy
Plan Total Fractions Prescribed: 7
Plan Total Prescribed Dose: 14 Gy
Reference Point Dosage Given to Date: 12 Gy
Reference Point Session Dosage Given: 2 Gy
Session Number: 20

## 2023-12-14 ENCOUNTER — Other Ambulatory Visit: Payer: Self-pay

## 2023-12-14 ENCOUNTER — Ambulatory Visit: Payer: Medicare PPO | Admitting: Radiation Oncology

## 2023-12-14 ENCOUNTER — Ambulatory Visit
Admission: RE | Admit: 2023-12-14 | Discharge: 2023-12-14 | Disposition: A | Discharge status: Home / Self Care | Payer: Medicare PPO | Source: Ambulatory Visit | Attending: Radiation Oncology | Admitting: Radiation Oncology

## 2023-12-14 DIAGNOSIS — Z510 Encounter for antineoplastic radiation therapy: Secondary | ICD-10-CM | POA: Diagnosis not present

## 2023-12-14 DIAGNOSIS — E039 Hypothyroidism, unspecified: Secondary | ICD-10-CM | POA: Diagnosis not present

## 2023-12-14 DIAGNOSIS — C50812 Malignant neoplasm of overlapping sites of left female breast: Secondary | ICD-10-CM | POA: Diagnosis not present

## 2023-12-14 DIAGNOSIS — Z170 Estrogen receptor positive status [ER+]: Secondary | ICD-10-CM | POA: Diagnosis not present

## 2023-12-14 LAB — RAD ONC ARIA SESSION SUMMARY
Course Elapsed Days: 28
Plan Fractions Treated to Date: 7
Plan Prescribed Dose Per Fraction: 2 Gy
Plan Total Fractions Prescribed: 7
Plan Total Prescribed Dose: 14 Gy
Reference Point Dosage Given to Date: 14 Gy
Reference Point Session Dosage Given: 2 Gy
Session Number: 21

## 2023-12-15 NOTE — Radiation Completion Notes (Signed)
Patient Name: Catherine, [name] MRN: [id number] Date of Birth: Oct 11, 1951 Referring Physician:  , M.D. Date of Service: 2023-12-15 Radiation Oncologist: Lance Bosch, M.D. [facility]                             RADIATION ONCOLOGY END OF TREATMENT NOTE     Diagnosis: C50.212 Malignant neoplasm of upper-inner quadrant of left female breast Staging on 2023-09-02: Malignant neoplasm of left breast in female, estrogen receptor positive (HCC) T=pT1b, N=pN0, M=cM0 Staging on 2023-08-10: Malignant neoplasm of left breast in female, estrogen receptor positive (HCC) T=cT1b, N=cN0, M=cM0 Intent: Curative     ==========DELIVERED PLANS==========  First Treatment Date: 2023-11-16 Last Treatment Date: 2023-12-14   Plan Name: Breast_L_BH Site: Breast, Left Technique: 3D Mode: Photon Dose Per Fraction: 2.66 Gy Prescribed Dose (Delivered / Prescribed): 37.24 Gy / 37.24 Gy Prescribed Fxs (Delivered / Prescribed): 14 / 14   Plan Name: Breast_L_Bst Site: Breast, Left Technique: Electron Mode: Electron Dose Per Fraction: 2 Gy Prescribed Dose (Delivered / Prescribed): 14 Gy / 14 Gy Prescribed Fxs (Delivered / Prescribed): 7 / 7     ==========ON TREATMENT VISIT DATES========== 2023-11-16, 2023-11-23, 2023-11-30, 2023-12-07, 2023-12-13     ==========UPCOMING VISITS==========       ==========APPENDIX - ON TREATMENT VISIT NOTES==========   See weekly On Treatment Notes in Epic for details in the Media tab (listed as Progress notes on the On Treatment Visit Dates listed above).

## 2023-12-27 DIAGNOSIS — J3089 Other allergic rhinitis: Secondary | ICD-10-CM | POA: Diagnosis not present

## 2023-12-27 NOTE — Progress Notes (Signed)
 VIAL ONE MADE 12-27-23. EXP 12-26-24

## 2023-12-28 DIAGNOSIS — J302 Other seasonal allergic rhinitis: Secondary | ICD-10-CM | POA: Diagnosis not present

## 2023-12-28 NOTE — Progress Notes (Signed)
 VIAL 2 MADE 12-28-23. EXO 12-27-24

## 2023-12-29 DIAGNOSIS — S336XXA Sprain of sacroiliac joint, initial encounter: Secondary | ICD-10-CM | POA: Diagnosis not present

## 2023-12-29 DIAGNOSIS — M9905 Segmental and somatic dysfunction of pelvic region: Secondary | ICD-10-CM | POA: Diagnosis not present

## 2023-12-29 DIAGNOSIS — M9903 Segmental and somatic dysfunction of lumbar region: Secondary | ICD-10-CM | POA: Diagnosis not present

## 2023-12-29 DIAGNOSIS — M62830 Muscle spasm of back: Secondary | ICD-10-CM | POA: Diagnosis not present

## 2023-12-29 DIAGNOSIS — M9902 Segmental and somatic dysfunction of thoracic region: Secondary | ICD-10-CM | POA: Diagnosis not present

## 2024-01-03 ENCOUNTER — Encounter: Payer: Self-pay | Admitting: Allergy and Immunology

## 2024-01-03 ENCOUNTER — Ambulatory Visit (INDEPENDENT_AMBULATORY_CARE_PROVIDER_SITE_OTHER): Admitting: Allergy and Immunology

## 2024-01-03 VITALS — BP 130/82 | HR 71 | Resp 16

## 2024-01-03 DIAGNOSIS — J988 Other specified respiratory disorders: Secondary | ICD-10-CM | POA: Diagnosis not present

## 2024-01-03 DIAGNOSIS — K219 Gastro-esophageal reflux disease without esophagitis: Secondary | ICD-10-CM

## 2024-01-03 DIAGNOSIS — J3089 Other allergic rhinitis: Secondary | ICD-10-CM

## 2024-01-03 DIAGNOSIS — B9789 Other viral agents as the cause of diseases classified elsewhere: Secondary | ICD-10-CM

## 2024-01-03 DIAGNOSIS — H6993 Unspecified Eustachian tube disorder, bilateral: Secondary | ICD-10-CM

## 2024-01-03 NOTE — Patient Instructions (Addendum)
  1.  Continue immunotherapy  2.  Continue Ryaltris - 2 sprays each nostril 1-2 times per day (replaces Nasacort)  3.  Continue Zyrtec 10 mg 1 time per day & nasal saline & positive pressure inflation of ears if needed  4.  Continue Nexium 20 mg 1 time per day.    5. For this event:   A. Nasal saline a few times per day  B. OTC Mucinex  C. Prednisone 10 mg - 1 tablet 1 time per day for 5 days only  D. Home Combo Flu / Covid swab  E. Further evaluation???  6. Return to clinic in 1 year or earlier if problem
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
## 2024-01-04 ENCOUNTER — Encounter: Payer: Self-pay | Admitting: Allergy and Immunology

## 2024-01-06 ENCOUNTER — Other Ambulatory Visit: Payer: Self-pay | Admitting: *Deleted

## 2024-01-06 ENCOUNTER — Telehealth: Payer: Self-pay | Admitting: *Deleted

## 2024-01-06 MED ORDER — BENZONATATE 100 MG PO CAPS: ORAL_CAPSULE | ORAL | 0 refills | Status: DC

## 2024-01-06 NOTE — Telephone Encounter (Signed)
RX sent and patient informed.  

## 2024-01-06 NOTE — Telephone Encounter (Signed)
 Catherine [name] is doing better but now all the mucus is breaking up and she is coughing quite a bit. She is requesting a prescription for the tessalon perles. Please advise.

## 2024-01-10 ENCOUNTER — Telehealth: Payer: Self-pay

## 2024-01-10 NOTE — Telephone Encounter (Signed)
-----   Message from Dellia Beckwith sent at 01/06/2024  1:37 PM EST ----- Regarding: RE: chest, nasal and head congestion okay ----- Message ----- From: Dyane Dustman, RN Sent: 01/05/2024  11:15 AM EST To: Dellia Beckwith, MD Subject: chest, nasal and head congestion               Call to let us know that she is having chest, nasal and head congestion with little sputum.   She saw Dr. Lucie Leather yesterday, he put her on steroids, tested for covid and flu all negative.  She states chest congestion is unusual for her, normally just has head and nasal congestion with colds.  She is going to wait another week to start her Letrozole, she wants to hold off until she gets rid of this congestion.

## 2024-01-12 ENCOUNTER — Ambulatory Visit (INDEPENDENT_AMBULATORY_CARE_PROVIDER_SITE_OTHER): Admitting: Allergy and Immunology

## 2024-01-12 ENCOUNTER — Ambulatory Visit
Admission: RE | Admit: 2024-01-12 | Discharge: 2024-01-12 | Disposition: A | Discharge status: Home / Self Care | Payer: Medicare PPO | Source: Ambulatory Visit | Attending: Radiation Oncology | Admitting: Radiation Oncology

## 2024-01-12 VITALS — BP 182/78 | HR 64 | Resp 14

## 2024-01-12 VITALS — BP 158/82 | HR 69 | Temp 98.5°F | Resp 18 | Ht 66.0 in | Wt 294.0 lb

## 2024-01-12 DIAGNOSIS — C50912 Malignant neoplasm of unspecified site of left female breast: Secondary | ICD-10-CM

## 2024-01-12 DIAGNOSIS — J3089 Other allergic rhinitis: Secondary | ICD-10-CM

## 2024-01-12 DIAGNOSIS — Z170 Estrogen receptor positive status [ER+]: Secondary | ICD-10-CM | POA: Insufficient documentation

## 2024-01-12 DIAGNOSIS — B9789 Other viral agents as the cause of diseases classified elsewhere: Secondary | ICD-10-CM | POA: Diagnosis not present

## 2024-01-12 DIAGNOSIS — C50212 Malignant neoplasm of upper-inner quadrant of left female breast: Secondary | ICD-10-CM | POA: Insufficient documentation

## 2024-01-12 DIAGNOSIS — K219 Gastro-esophageal reflux disease without esophagitis: Secondary | ICD-10-CM

## 2024-01-12 DIAGNOSIS — J988 Other specified respiratory disorders: Secondary | ICD-10-CM

## 2024-01-12 DIAGNOSIS — Z923 Personal history of irradiation: Secondary | ICD-10-CM | POA: Insufficient documentation

## 2024-01-12 DIAGNOSIS — J069 Acute upper respiratory infection, unspecified: Secondary | ICD-10-CM | POA: Insufficient documentation

## 2024-01-12 MED ORDER — METHYLPREDNISOLONE ACETATE 80 MG/ML IJ SUSP
80.0000 mg | Freq: Once | INTRAMUSCULAR | Status: AC
  Administered 2024-01-12: 80 mg via INTRAMUSCULAR

## 2024-01-12 MED ORDER — AZITHROMYCIN 500 MG PO TABS: ORAL_TABLET | ORAL | 0 refills | Status: DC

## 2024-01-12 NOTE — Progress Notes (Signed)
 Follow-up Note  HPI: The patient completed adjuvant left breast radiation approximately 1 month ago.  This is her first follow-up visit.  She has not yet started her aromatase inhibitor.  ROS: She is currently dealing with an upper respiratory infection, and is scheduled to start oral antibiotics today.  She reports no residual pain or itching involving her left breast.  She reports no nipple bleeding or discharge.  Her energy level is good.  PE: Temperature 98.5 pulse 69.  She is in no apparent distress.  Examination of her left breast reveals resolution of her radiation related skin changes.  A/P: She is doing well approximately 1 month out from completion of adjuvant left breast radiation.  Her treatment related skin changes have largely resolved.  She states that she will start her aromatase inhibitor once she recovers from her current respiratory infection.  She has follow-up scheduled with Dr. Gilman Buttner for May 1.  She will continue to follow-up regularly with Dr. Gilman Buttner, further follow-up in our department will be on a as needed basis, though I encouraged her to contact me with any questions or concerns she may have.    Daishon Chui A. Thersa Salt, MD
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
## 2024-01-12 NOTE — Progress Notes (Signed)
 Face to face contact with pt in [facility] of [facility]. Pt is here for a rad onc follow up with Dr. Thersa Salt. Pt denies any problems with radiation and verbalizes that she has not started her AI's yet.

## 2024-01-12 NOTE — Patient Instructions (Signed)
  1.  Continue immunotherapy  2.  Continue Ryaltris - 2 sprays each nostril 1-2 times per day (replaces Nasacort)  3.  Continue Zyrtec 10 mg 1 time per day & nasal saline & positive pressure inflation of ears if needed  4.  Continue Nexium 20 mg 1 time per day.    5. For this event:   A. Nasal saline a few times per day  B. OTC Mucinex DM  C. Azithromycin 500 mg - 1 tablet 1 time per day for 3 days  D. Depo-Medrol 80 IM delivered in clinic today  6. Return to clinic in 1 year or earlier if problem

## 2024-01-13 ENCOUNTER — Encounter: Payer: Self-pay | Admitting: Allergy and Immunology

## 2024-01-13 ENCOUNTER — Encounter: Payer: Self-pay | Admitting: Oncology

## 2024-01-17 DIAGNOSIS — I10 Essential (primary) hypertension: Secondary | ICD-10-CM | POA: Diagnosis not present

## 2024-01-17 DIAGNOSIS — G4733 Obstructive sleep apnea (adult) (pediatric): Secondary | ICD-10-CM | POA: Diagnosis not present

## 2024-01-20 ENCOUNTER — Ambulatory Visit (INDEPENDENT_AMBULATORY_CARE_PROVIDER_SITE_OTHER): Payer: Self-pay

## 2024-01-20 DIAGNOSIS — J309 Allergic rhinitis, unspecified: Secondary | ICD-10-CM

## 2024-01-25 DIAGNOSIS — E782 Mixed hyperlipidemia: Secondary | ICD-10-CM | POA: Diagnosis not present

## 2024-01-25 DIAGNOSIS — I10 Essential (primary) hypertension: Secondary | ICD-10-CM | POA: Diagnosis not present

## 2024-01-25 DIAGNOSIS — E559 Vitamin D deficiency, unspecified: Secondary | ICD-10-CM | POA: Diagnosis not present

## 2024-01-25 DIAGNOSIS — C50919 Malignant neoplasm of unspecified site of unspecified female breast: Secondary | ICD-10-CM | POA: Diagnosis not present

## 2024-01-25 DIAGNOSIS — E042 Nontoxic multinodular goiter: Secondary | ICD-10-CM | POA: Diagnosis not present

## 2024-01-25 LAB — BASIC METABOLIC PANEL WITH GFR
BUN: 18 (ref 4–21)
CO2: 27 — AB (ref 13–22)
Chloride: 106 (ref 99–108)
Creatinine: 0.8 (ref 0.5–1.1)
EGFR (Non-African Amer.): 77
Free T4: 0.71 ng/dL
Glucose: 79
Potassium: 4.5 meq/L (ref 3.5–5.1)
Sodium: 141 (ref 137–147)
TSH: 1.6 (ref 0.41–5.90)

## 2024-01-25 LAB — CBC AND DIFFERENTIAL
HCT: 36 (ref 36–46)
Hemoglobin: 12.3 (ref 12.0–16.0)
Neutrophils Absolute: 5.1
Platelets: 276 10*3/uL (ref 150–400)
WBC: 7.1

## 2024-01-25 LAB — COMPREHENSIVE METABOLIC PANEL WITH GFR
Albumin: 4.1 (ref 3.5–5.0)
Calcium: 9.4 (ref 8.7–10.7)

## 2024-01-25 LAB — CBC: RBC: 4.06 (ref 3.87–5.11)

## 2024-01-25 LAB — VITAMIN D 25 HYDROXY (VIT D DEFICIENCY, FRACTURES): Vit D, 25-Hydroxy: 36.3

## 2024-01-25 LAB — TSH: TSH: 1.6 (ref 0.41–5.90)

## 2024-01-26 DIAGNOSIS — M9905 Segmental and somatic dysfunction of pelvic region: Secondary | ICD-10-CM | POA: Diagnosis not present

## 2024-01-26 DIAGNOSIS — M62830 Muscle spasm of back: Secondary | ICD-10-CM | POA: Diagnosis not present

## 2024-01-26 DIAGNOSIS — M9903 Segmental and somatic dysfunction of lumbar region: Secondary | ICD-10-CM | POA: Diagnosis not present

## 2024-01-26 DIAGNOSIS — M9902 Segmental and somatic dysfunction of thoracic region: Secondary | ICD-10-CM | POA: Diagnosis not present

## 2024-01-26 DIAGNOSIS — S336XXA Sprain of sacroiliac joint, initial encounter: Secondary | ICD-10-CM | POA: Diagnosis not present

## 2024-01-29 DIAGNOSIS — L821 Other seborrheic keratosis: Secondary | ICD-10-CM | POA: Diagnosis not present

## 2024-01-29 DIAGNOSIS — L300 Nummular dermatitis: Secondary | ICD-10-CM | POA: Diagnosis not present

## 2024-02-01 DIAGNOSIS — L602 Onychogryphosis: Secondary | ICD-10-CM | POA: Diagnosis not present

## 2024-02-02 ENCOUNTER — Ambulatory Visit (INDEPENDENT_AMBULATORY_CARE_PROVIDER_SITE_OTHER): Payer: Self-pay

## 2024-02-02 DIAGNOSIS — J309 Allergic rhinitis, unspecified: Secondary | ICD-10-CM

## 2024-02-02 LAB — T4, FREE: Free T4: 0.71

## 2024-02-21 ENCOUNTER — Ambulatory Visit (INDEPENDENT_AMBULATORY_CARE_PROVIDER_SITE_OTHER): Payer: Self-pay | Admitting: *Deleted

## 2024-02-21 DIAGNOSIS — J309 Allergic rhinitis, unspecified: Secondary | ICD-10-CM | POA: Diagnosis not present

## 2024-02-23 DIAGNOSIS — M9902 Segmental and somatic dysfunction of thoracic region: Secondary | ICD-10-CM | POA: Diagnosis not present

## 2024-02-23 DIAGNOSIS — M9903 Segmental and somatic dysfunction of lumbar region: Secondary | ICD-10-CM | POA: Diagnosis not present

## 2024-02-23 DIAGNOSIS — M9905 Segmental and somatic dysfunction of pelvic region: Secondary | ICD-10-CM | POA: Diagnosis not present
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
## 2024-03-02 ENCOUNTER — Encounter: Payer: Self-pay | Admitting: Oncology

## 2024-03-02 ENCOUNTER — Other Ambulatory Visit: Payer: Self-pay | Admitting: Oncology

## 2024-03-02 ENCOUNTER — Inpatient Hospital Stay: Payer: Medicare PPO | Attending: Oncology | Admitting: Oncology

## 2024-03-02 ENCOUNTER — Inpatient Hospital Stay: Payer: Medicare PPO

## 2024-03-02 ENCOUNTER — Other Ambulatory Visit: Payer: Self-pay

## 2024-03-02 VITALS — BP 140/78 | HR 67 | Temp 97.8°F | Resp 16 | Ht 66.0 in | Wt 296.8 lb

## 2024-03-02 DIAGNOSIS — E039 Hypothyroidism, unspecified: Secondary | ICD-10-CM | POA: Insufficient documentation

## 2024-03-02 DIAGNOSIS — C50911 Malignant neoplasm of unspecified site of right female breast: Secondary | ICD-10-CM | POA: Insufficient documentation

## 2024-03-02 DIAGNOSIS — Z79811 Long term (current) use of aromatase inhibitors: Secondary | ICD-10-CM | POA: Diagnosis not present

## 2024-03-02 DIAGNOSIS — Z170 Estrogen receptor positive status [ER+]: Secondary | ICD-10-CM | POA: Insufficient documentation

## 2024-03-02 DIAGNOSIS — Z780 Asymptomatic menopausal state: Secondary | ICD-10-CM | POA: Diagnosis not present

## 2024-03-02 DIAGNOSIS — M85831 Other specified disorders of bone density and structure, right forearm: Secondary | ICD-10-CM | POA: Insufficient documentation

## 2024-03-02 DIAGNOSIS — M85851 Other specified disorders of bone density and structure, right thigh: Secondary | ICD-10-CM | POA: Insufficient documentation

## 2024-03-02 DIAGNOSIS — C50912 Malignant neoplasm of unspecified site of left female breast: Secondary | ICD-10-CM | POA: Diagnosis not present

## 2024-03-02 DIAGNOSIS — M8580 Other specified disorders of bone density and structure, unspecified site: Secondary | ICD-10-CM

## 2024-03-02 DIAGNOSIS — N83209 Unspecified ovarian cyst, unspecified side: Secondary | ICD-10-CM | POA: Insufficient documentation

## 2024-03-02 DIAGNOSIS — C50212 Malignant neoplasm of upper-inner quadrant of left female breast: Secondary | ICD-10-CM

## 2024-03-02 DIAGNOSIS — E782 Mixed hyperlipidemia: Secondary | ICD-10-CM

## 2024-03-02 LAB — CBC WITH DIFFERENTIAL (CANCER CENTER ONLY)
Abs Immature Granulocytes: 0.03 10*3/uL (ref 0.00–0.07)
Basophils Absolute: 0 10*3/uL (ref 0.0–0.1)
Basophils Relative: 0 %
Eosinophils Absolute: 0 10*3/uL (ref 0.0–0.5)
Eosinophils Relative: 0 %
HCT: 36.9 % (ref 36.0–46.0)
Hemoglobin: 12.7 g/dL (ref 12.0–15.0)
Immature Granulocytes: 0 %
Lymphocytes Relative: 19 %
Lymphs Abs: 1.3 10*3/uL (ref 0.7–4.0)
MCH: 30.9 pg (ref 26.0–34.0)
MCHC: 34.4 g/dL (ref 30.0–36.0)
MCV: 89.8 fL (ref 80.0–100.0)
Monocytes Absolute: 0.5 10*3/uL (ref 0.1–1.0)
Monocytes Relative: 8 %
Neutro Abs: 5 10*3/uL (ref 1.7–7.7)
Neutrophils Relative %: 73 %
Platelet Count: 263 10*3/uL (ref 150–400)
RBC: 4.11 MIL/uL (ref 3.87–5.11)
RDW: 13.2 % (ref 11.5–15.5)
WBC Count: 6.8 10*3/uL (ref 4.0–10.5)
nRBC: 0 % (ref 0.0–0.2)
nRBC: 0 /100{WBCs}

## 2024-03-02 LAB — CMP (CANCER CENTER ONLY)
ALT: 22 U/L (ref 0–44)
AST: 25 U/L (ref 15–41)
Albumin: 4 g/dL (ref 3.5–5.0)
Alkaline Phosphatase: 91 U/L (ref 38–126)
Anion gap: 10 (ref 5–15)
BUN: 18 mg/dL (ref 8–23)
CO2: 24 mmol/L (ref 22–32)
Calcium: 9.6 mg/dL (ref 8.9–10.3)
Chloride: 105 mmol/L (ref 98–111)
Creatinine: 0.89 mg/dL (ref 0.44–1.00)
GFR, Estimated: >60 mL/min (ref >60)
Glucose, Bld: 92 mg/dL (ref 70–99)
Potassium: 4.5 mmol/L (ref 3.5–5.1)
Sodium: 140 mmol/L (ref 135–145)
Total Bilirubin: 0.5 mg/dL (ref 0.0–1.2)
Total Protein: 7.7 g/dL (ref 6.5–8.1)

## 2024-03-02 LAB — LIPID PANEL
Cholesterol: 207 mg/dL — ABNORMAL HIGH (ref 0–200)
HDL: 58 mg/dL (ref >40)
LDL Cholesterol: 119 mg/dL — ABNORMAL HIGH (ref 0–99)
Total CHOL/HDL Ratio: 3.6 ratio
Triglycerides: 150 mg/dL — ABNORMAL HIGH (ref <150)
VLDL: 30 mg/dL (ref 0–40)

## 2024-03-02 LAB — MAGNESIUM: Magnesium: 2.3 mg/dL (ref 1.7–2.4)

## 2024-03-02 LAB — TSH: TSH: 2.399 u[IU]/mL (ref 0.350–4.500)

## 2024-03-03 ENCOUNTER — Telehealth: Payer: Self-pay | Admitting: Oncology

## 2024-03-03 LAB — T4: T4, Total: 9.2 ug/dL (ref 4.5–12.0)

## 2024-03-03 NOTE — Telephone Encounter (Signed)
 Patient has been scheduled for follow-up visit per 03/03/24 LOS.  Pt noted appt details on personal planner/calendar.

## 2024-03-07 ENCOUNTER — Ambulatory Visit (INDEPENDENT_AMBULATORY_CARE_PROVIDER_SITE_OTHER): Payer: Self-pay | Admitting: *Deleted

## 2024-03-07 DIAGNOSIS — J309 Allergic rhinitis, unspecified: Secondary | ICD-10-CM | POA: Diagnosis not present

## 2024-03-16 ENCOUNTER — Encounter: Payer: Self-pay | Admitting: Oncology

## 2024-03-22 DIAGNOSIS — M9903 Segmental and somatic dysfunction of lumbar region: Secondary | ICD-10-CM | POA: Diagnosis not present

## 2024-03-22 DIAGNOSIS — M9905 Segmental and somatic dysfunction of pelvic region: Secondary | ICD-10-CM | POA: Diagnosis not present

## 2024-03-22 DIAGNOSIS — M9902 Segmental and somatic dysfunction of thoracic region: Secondary | ICD-10-CM | POA: Diagnosis not present

## 2024-04-05 ENCOUNTER — Ambulatory Visit (INDEPENDENT_AMBULATORY_CARE_PROVIDER_SITE_OTHER): Payer: Self-pay

## 2024-04-05 DIAGNOSIS — J309 Allergic rhinitis, unspecified: Secondary | ICD-10-CM

## 2024-04-06 ENCOUNTER — Telehealth: Payer: Self-pay | Admitting: Cardiology

## 2024-04-06 ENCOUNTER — Telehealth: Payer: Self-pay

## 2024-04-06 DIAGNOSIS — I10 Essential (primary) hypertension: Secondary | ICD-10-CM

## 2024-04-06 NOTE — Telephone Encounter (Signed)
 LVM to call re message

## 2024-04-06 NOTE — Telephone Encounter (Signed)
 Patient is returning call. Please advise?

## 2024-04-06 NOTE — Telephone Encounter (Signed)
 Pt c/o BP issue: STAT if pt c/o blurred vision, one-sided weakness or slurred speech.  STAT if BP is GREATER than 180/120 TODAY.  STAT if BP is LESS than 90/60 and SYMPTOMATIC TODAY  1. What is your BP concern? Elevated  2. Have you taken any BP medication today?yes  3. What are your last 5 BP readings? Will have readings when nurse calls back  4. Are you having any other symptoms (ex. Dizziness, headache, blurred vision, passed out)? headache

## 2024-04-06 NOTE — Telephone Encounter (Signed)
 Pt called stating that her BP was up yesterday. 182/89, 177/82, 167/79, 170/84 then this morning after her medication 140/71. She stated that she is on a new cancer medication and has had increased stress and anxiety. She takes Atacand  8mg  daily. Will send to Dr. Krasowski.

## 2024-04-07 ENCOUNTER — Telehealth: Payer: Self-pay

## 2024-04-07 MED ORDER — CANDESARTAN CILEXETIL 8 MG PO TABS: 16.0000 mg | ORAL_TABLET | Freq: Every day | ORAL | Status: DC

## 2024-04-07 NOTE — Telephone Encounter (Signed)
 Pt called to make you aware that her blood pressure had been erratic lately. She went to see Dr Gordan Latina and he increased her BP medication (BP's had been in 180's). Pt feels as though her anxiety has also been playing a part in her BP going up.

## 2024-04-07 NOTE — Addendum Note (Signed)
 Addended by: Shawnee Dellen D on: 04/07/2024 09:46 AM   Modules accepted: Orders

## 2024-04-07 NOTE — Telephone Encounter (Signed)
 Spoke with pt. She stated that she had blood work completed on May 1. Dr.Krasowski reviewed CMP and recommended that she increase Atacand  to 16mg  and have a BMP drawn in 1 week. Pt aware.

## 2024-04-11 ENCOUNTER — Encounter: Payer: Self-pay | Admitting: Oncology

## 2024-04-13 DIAGNOSIS — I10 Essential (primary) hypertension: Secondary | ICD-10-CM | POA: Diagnosis not present

## 2024-04-13 DIAGNOSIS — H40013 Open angle with borderline findings, low risk, bilateral: Secondary | ICD-10-CM | POA: Diagnosis not present

## 2024-04-13 MED ORDER — CANDESARTAN CILEXETIL 8 MG PO TABS: 16.0000 mg | ORAL_TABLET | Freq: Every day | ORAL | 0 refills | Status: DC

## 2024-04-13 NOTE — Addendum Note (Signed)
 Addended by: Venecia Mehl, Jyl Or L on: 04/13/2024 11:12 AM   Modules accepted: Orders

## 2024-04-14 ENCOUNTER — Telehealth: Payer: Self-pay

## 2024-04-14 MED ORDER — CANDESARTAN CILEXETIL 8 MG PO TABS
16.0000 mg | ORAL_TABLET | Freq: Every day | ORAL | 2 refills | Status: DC
Start: 2024-04-14 — End: 2024-06-09

## 2024-04-14 NOTE — Telephone Encounter (Signed)
 Medication sent.

## 2024-04-19 ENCOUNTER — Telehealth: Payer: Self-pay | Admitting: Cardiology

## 2024-04-19 NOTE — Telephone Encounter (Signed)
 Patient called to report her BP has stabilized but wants to know if she needs to be seen sooner than 9/23.

## 2024-04-19 NOTE — Telephone Encounter (Signed)
 Left vm to return call.

## 2024-04-19 NOTE — Telephone Encounter (Signed)
 Pt states that she feels her BP and sx has leveled out. Advised to keep a log of BP and let us  know if she has problems or her BP is elevated and we will get her in for an appointment.

## 2024-04-25 ENCOUNTER — Ambulatory Visit (INDEPENDENT_AMBULATORY_CARE_PROVIDER_SITE_OTHER)

## 2024-04-25 DIAGNOSIS — J309 Allergic rhinitis, unspecified: Secondary | ICD-10-CM

## 2024-04-25 DIAGNOSIS — L602 Onychogryphosis: Secondary | ICD-10-CM | POA: Diagnosis not present

## 2024-04-26 DIAGNOSIS — M9905 Segmental and somatic dysfunction of pelvic region: Secondary | ICD-10-CM | POA: Diagnosis not present

## 2024-04-26 DIAGNOSIS — M9903 Segmental and somatic dysfunction of lumbar region: Secondary | ICD-10-CM | POA: Diagnosis not present

## 2024-04-26 DIAGNOSIS — M9902 Segmental and somatic dysfunction of thoracic region: Secondary | ICD-10-CM | POA: Diagnosis not present

## 2024-05-10 ENCOUNTER — Other Ambulatory Visit: Payer: Self-pay | Admitting: Oncology

## 2024-05-10 DIAGNOSIS — Z170 Estrogen receptor positive status [ER+]: Secondary | ICD-10-CM

## 2024-05-12 DIAGNOSIS — I10 Essential (primary) hypertension: Secondary | ICD-10-CM | POA: Diagnosis not present

## 2024-05-12 DIAGNOSIS — G4733 Obstructive sleep apnea (adult) (pediatric): Secondary | ICD-10-CM | POA: Diagnosis not present

## 2024-05-15 ENCOUNTER — Ambulatory Visit (INDEPENDENT_AMBULATORY_CARE_PROVIDER_SITE_OTHER): Payer: Self-pay | Admitting: *Deleted

## 2024-05-15 DIAGNOSIS — J309 Allergic rhinitis, unspecified: Secondary | ICD-10-CM

## 2024-05-17 DIAGNOSIS — J3089 Other allergic rhinitis: Secondary | ICD-10-CM | POA: Diagnosis not present

## 2024-05-17 NOTE — Progress Notes (Signed)
 VIALS MADE 05-17-24

## 2024-05-19 DIAGNOSIS — J302 Other seasonal allergic rhinitis: Secondary | ICD-10-CM | POA: Diagnosis not present

## 2024-05-31 DIAGNOSIS — M9903 Segmental and somatic dysfunction of lumbar region: Secondary | ICD-10-CM | POA: Diagnosis not present

## 2024-05-31 DIAGNOSIS — M9905 Segmental and somatic dysfunction of pelvic region: Secondary | ICD-10-CM | POA: Diagnosis not present

## 2024-05-31 DIAGNOSIS — M9902 Segmental and somatic dysfunction of thoracic region: Secondary | ICD-10-CM | POA: Diagnosis not present

## 2024-06-07 ENCOUNTER — Ambulatory Visit (HOSPITAL_BASED_OUTPATIENT_CLINIC_OR_DEPARTMENT_OTHER): Admitting: Nurse Practitioner

## 2024-06-07 ENCOUNTER — Encounter (HOSPITAL_BASED_OUTPATIENT_CLINIC_OR_DEPARTMENT_OTHER): Payer: Self-pay | Admitting: Nurse Practitioner

## 2024-06-07 VITALS — BP 142/88 | HR 61 | Ht 66.0 in | Wt 306.5 lb

## 2024-06-07 DIAGNOSIS — G4733 Obstructive sleep apnea (adult) (pediatric): Secondary | ICD-10-CM

## 2024-06-07 DIAGNOSIS — Z170 Estrogen receptor positive status [ER+]: Secondary | ICD-10-CM

## 2024-06-07 DIAGNOSIS — C50212 Malignant neoplasm of upper-inner quadrant of left female breast: Secondary | ICD-10-CM

## 2024-06-07 DIAGNOSIS — I10 Essential (primary) hypertension: Secondary | ICD-10-CM

## 2024-06-07 NOTE — Assessment & Plan Note (Signed)
 142/88 today. Prior symptoms have resolved. Encouraged to continue monitoring at home for goal <140/90. Follow up with cardiology as scheduled

## 2024-06-07 NOTE — Assessment & Plan Note (Signed)
 Severe OSA on CPAP. Excellent compliance and control. Receives benefit from use. Aware of risks of untreated OSA. Understands proper care/use of device. Healthy weight loss encouraged. Safe driving practices. Encouraged to continue using nightly.  Patient Instructions  Continue to use CPAP every night, minimum of 4-6 hours a night.  Change equipment as directed. Wash your tubing with warm soap and water daily, hang to dry. Wash humidifier portion weekly. Use bottled, distilled water and change daily Be aware of reduced alertness and do not drive or operate heavy machinery if experiencing this or drowsiness.  Exercise encouraged, as tolerated. Healthy weight management discussed.  Avoid or decrease alcohol consumption and medications that make you more sleepy, if possible. Notify if persistent daytime sleepiness occurs even with consistent use of PAP therapy.  Change CPAP supplies... Every month Mask cushions and/or nasal pillows CPAP machine filters Every 3 months Mask frame (not including the headgear) CPAP tubing Every 6 months Mask headgear Chin strap (if applicable) Humidifier water tub  Follow up in one year with Katie Cordae Mccarey,NP, or sooner, if needed

## 2024-06-07 NOTE — Assessment & Plan Note (Signed)
 Initially treated for right breast cancer with lumpectomy and anastrozole. Left breast cancer identified 2024. S/p lumpectomy and radiation. Difficulties tolerating letrozole . Unclear if this was coincidence in timing or if it was truly medication related. She will follow up with oncology to discuss later this month.
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
## 2024-06-07 NOTE — Patient Instructions (Signed)
 Continue to use CPAP every night, minimum of 4-6 hours a night.  Change equipment as directed. Wash your tubing with warm soap and water daily, hang to dry. Wash humidifier portion weekly. Use bottled, distilled water and change daily Be aware of reduced alertness and do not drive or operate heavy machinery if experiencing this or drowsiness.  Exercise encouraged, as tolerated. Healthy weight management discussed.  Avoid or decrease alcohol consumption and medications that make you more sleepy, if possible. Notify if persistent daytime sleepiness occurs even with consistent use of PAP therapy.  Change CPAP supplies... Every month Mask cushions and/or nasal pillows CPAP machine filters Every 3 months Mask frame (not including the headgear) CPAP tubing Every 6 months Mask headgear Chin strap (if applicable) Humidifier water tub  Follow up in one year with Catherine Jonte Wollam,NP, or sooner, if needed

## 2024-06-08 ENCOUNTER — Other Ambulatory Visit: Payer: Self-pay | Admitting: Cardiology

## 2024-06-09 ENCOUNTER — Ambulatory Visit (INDEPENDENT_AMBULATORY_CARE_PROVIDER_SITE_OTHER): Payer: Self-pay | Admitting: *Deleted

## 2024-06-09 DIAGNOSIS — J309 Allergic rhinitis, unspecified: Secondary | ICD-10-CM | POA: Diagnosis not present

## 2024-06-28 ENCOUNTER — Ambulatory Visit (INDEPENDENT_AMBULATORY_CARE_PROVIDER_SITE_OTHER): Payer: Self-pay

## 2024-06-28 DIAGNOSIS — M9905 Segmental and somatic dysfunction of pelvic region: Secondary | ICD-10-CM | POA: Diagnosis not present

## 2024-06-28 DIAGNOSIS — J309 Allergic rhinitis, unspecified: Secondary | ICD-10-CM

## 2024-06-28 DIAGNOSIS — M9902 Segmental and somatic dysfunction of thoracic region: Secondary | ICD-10-CM | POA: Diagnosis not present

## 2024-06-28 DIAGNOSIS — M9903 Segmental and somatic dysfunction of lumbar region: Secondary | ICD-10-CM | POA: Diagnosis not present

## 2024-07-06 ENCOUNTER — Other Ambulatory Visit: Payer: Self-pay | Admitting: Cardiology

## 2024-07-10 ENCOUNTER — Other Ambulatory Visit (HOSPITAL_BASED_OUTPATIENT_CLINIC_OR_DEPARTMENT_OTHER): Payer: Self-pay | Admitting: Obstetrics & Gynecology

## 2024-07-11 DIAGNOSIS — L602 Onychogryphosis: Secondary | ICD-10-CM | POA: Diagnosis not present

## 2024-07-17 DIAGNOSIS — R92323 Mammographic fibroglandular density, bilateral breasts: Secondary | ICD-10-CM | POA: Diagnosis not present

## 2024-07-17 DIAGNOSIS — R928 Other abnormal and inconclusive findings on diagnostic imaging of breast: Secondary | ICD-10-CM | POA: Diagnosis not present

## 2024-07-17 DIAGNOSIS — Z853 Personal history of malignant neoplasm of breast: Secondary | ICD-10-CM | POA: Diagnosis not present

## 2024-07-17 LAB — HM MAMMOGRAPHY

## 2024-07-19 DIAGNOSIS — M9902 Segmental and somatic dysfunction of thoracic region: Secondary | ICD-10-CM | POA: Diagnosis not present

## 2024-07-19 DIAGNOSIS — M9905 Segmental and somatic dysfunction of pelvic region: Secondary | ICD-10-CM | POA: Diagnosis not present

## 2024-07-19 DIAGNOSIS — M9903 Segmental and somatic dysfunction of lumbar region: Secondary | ICD-10-CM | POA: Diagnosis not present

## 2024-07-20 ENCOUNTER — Other Ambulatory Visit: Payer: Self-pay | Admitting: Oncology

## 2024-07-20 ENCOUNTER — Encounter: Payer: Self-pay | Admitting: Oncology

## 2024-07-20 ENCOUNTER — Inpatient Hospital Stay

## 2024-07-20 ENCOUNTER — Inpatient Hospital Stay: Attending: Oncology | Admitting: Oncology

## 2024-07-20 ENCOUNTER — Telehealth: Payer: Self-pay | Admitting: Oncology

## 2024-07-20 VITALS — BP 160/80 | HR 67 | Temp 97.8°F | Resp 16 | Ht 66.0 in | Wt 303.3 lb

## 2024-07-20 DIAGNOSIS — Z170 Estrogen receptor positive status [ER+]: Secondary | ICD-10-CM | POA: Diagnosis not present

## 2024-07-20 DIAGNOSIS — C50212 Malignant neoplasm of upper-inner quadrant of left female breast: Secondary | ICD-10-CM

## 2024-07-20 DIAGNOSIS — E039 Hypothyroidism, unspecified: Secondary | ICD-10-CM | POA: Insufficient documentation

## 2024-07-20 DIAGNOSIS — Z23 Encounter for immunization: Secondary | ICD-10-CM | POA: Diagnosis not present

## 2024-07-20 DIAGNOSIS — Z780 Asymptomatic menopausal state: Secondary | ICD-10-CM

## 2024-07-20 LAB — CMP (CANCER CENTER ONLY)
ALT: 23 U/L (ref 0–44)
AST: 27 U/L (ref 15–41)
Albumin: 3.9 g/dL (ref 3.5–5.0)
Alkaline Phosphatase: 74 U/L (ref 38–126)
Anion gap: 15 (ref 5–15)
BUN: 18 mg/dL (ref 8–23)
CO2: 21 mmol/L — ABNORMAL LOW (ref 22–32)
Calcium: 9.4 mg/dL (ref 8.9–10.3)
Chloride: 105 mmol/L (ref 98–111)
Creatinine: 0.76 mg/dL (ref 0.44–1.00)
GFR, Estimated: >60 mL/min (ref >60)
Glucose, Bld: 104 mg/dL — ABNORMAL HIGH (ref 70–99)
Potassium: 4.1 mmol/L (ref 3.5–5.1)
Sodium: 141 mmol/L (ref 135–145)
Total Bilirubin: 0.3 mg/dL (ref 0.0–1.2)
Total Protein: 7.5 g/dL (ref 6.5–8.1)

## 2024-07-20 LAB — CBC WITH DIFFERENTIAL (CANCER CENTER ONLY)
Abs Immature Granulocytes: 0.03 K/uL (ref 0.00–0.07)
Basophils Absolute: 0 K/uL (ref 0.0–0.1)
Basophils Relative: 1 %
Eosinophils Absolute: 0.1 K/uL (ref 0.0–0.5)
Eosinophils Relative: 1 %
HCT: 36.1 % (ref 36.0–46.0)
Hemoglobin: 12.3 g/dL (ref 12.0–15.0)
Immature Granulocytes: 1 %
Lymphocytes Relative: 24 %
Lymphs Abs: 1.5 K/uL (ref 0.7–4.0)
MCH: 30.5 pg (ref 26.0–34.0)
MCHC: 34.1 g/dL (ref 30.0–36.0)
MCV: 89.6 fL (ref 80.0–100.0)
Monocytes Absolute: 0.6 K/uL (ref 0.1–1.0)
Monocytes Relative: 9 %
Neutro Abs: 3.9 K/uL (ref 1.7–7.7)
Neutrophils Relative %: 64 %
Platelet Count: 270 K/uL (ref 150–400)
RBC: 4.03 MIL/uL (ref 3.87–5.11)
RDW: 13 % (ref 11.5–15.5)
WBC Count: 6 K/uL (ref 4.0–10.5)
nRBC: 0 % (ref 0.0–0.2)

## 2024-07-20 LAB — TSH: TSH: 1.79 u[IU]/mL (ref 0.350–4.500)

## 2024-07-20 MED ORDER — EXEMESTANE 25 MG PO TABS: 25.0000 mg | ORAL_TABLET | Freq: Every day | ORAL | 5 refills | Status: DC

## 2024-07-20 MED ORDER — INFLUENZA VAC SPLIT HIGH-DOSE 0.5 ML IM SUSY: 0.5000 mL | PREFILLED_SYRINGE | INTRAMUSCULAR | Status: DC

## 2024-07-20 MED ORDER — INFLUENZA VAC SPLIT HIGH-DOSE 0.5 ML IM SUSY
0.5000 mL | PREFILLED_SYRINGE | Freq: Once | INTRAMUSCULAR | Status: AC
  Administered 2024-07-20: 0.5 mL via INTRAMUSCULAR
  Filled 2024-07-20: qty 0.5

## 2024-07-20 NOTE — Telephone Encounter (Signed)
 Patient has been scheduled for follow-up visit per 07/20/24 LOS.  Pt given an appt calendar with date and time.
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
## 2024-07-21 ENCOUNTER — Telehealth: Payer: Self-pay | Admitting: Oncology

## 2024-07-21 LAB — T4: T4, Total: 8.8 ug/dL (ref 4.5–12.0)

## 2024-07-21 NOTE — Telephone Encounter (Signed)
 07/21/24 LVM to schedule dexa scan

## 2024-07-24 ENCOUNTER — Telehealth: Payer: Self-pay

## 2024-07-24 ENCOUNTER — Telehealth: Payer: Self-pay | Admitting: Nurse Practitioner

## 2024-07-24 NOTE — Telephone Encounter (Signed)
 Sending community message to [employer].

## 2024-07-24 NOTE — Telephone Encounter (Signed)
 Copied from CRM #[id number]. Topic: Clinical - Order For Equipment >> Jul 24, 2024 10:16 AM Nathanel DEL wrote: Reason for CRM: pt saw [facility] to get new sleep supplies on 8/06.  Referral sent to [employer] for her supplies.  However, pt has not heard anything.  When I asked her to follow up w/[facility], pt refused.  She said it will be faster if we call them or resend the order.

## 2024-07-24 NOTE — Telephone Encounter (Signed)
 Patient notified of lab results

## 2024-07-24 NOTE — Telephone Encounter (Signed)
-----   Message from Wanda VEAR Cornish sent at 07/20/2024  2:30 PM EDT ----- Regarding: call Tell her labs look great, incl BS of 104.  Thyroid  pending.  Will need to send to Odella Jacobson when all labs in

## 2024-07-25 ENCOUNTER — Ambulatory Visit: Attending: Cardiology | Admitting: Cardiology

## 2024-07-25 ENCOUNTER — Encounter: Payer: Self-pay | Admitting: Cardiology

## 2024-07-25 VITALS — BP 178/80 | HR 76 | Ht 65.5 in | Wt 298.0 lb

## 2024-07-25 DIAGNOSIS — I272 Pulmonary hypertension, unspecified: Secondary | ICD-10-CM | POA: Diagnosis not present

## 2024-07-25 DIAGNOSIS — Z170 Estrogen receptor positive status [ER+]: Secondary | ICD-10-CM | POA: Diagnosis not present

## 2024-07-25 DIAGNOSIS — G4733 Obstructive sleep apnea (adult) (pediatric): Secondary | ICD-10-CM

## 2024-07-25 DIAGNOSIS — I059 Rheumatic mitral valve disease, unspecified: Secondary | ICD-10-CM | POA: Diagnosis not present

## 2024-07-25 DIAGNOSIS — C50411 Malignant neoplasm of upper-outer quadrant of right female breast: Secondary | ICD-10-CM | POA: Diagnosis not present

## 2024-07-25 DIAGNOSIS — I10 Essential (primary) hypertension: Secondary | ICD-10-CM | POA: Diagnosis not present

## 2024-07-25 MED ORDER — AMLODIPINE BESYLATE 5 MG PO TABS: 5.0000 mg | ORAL_TABLET | Freq: Every day | ORAL | 3 refills | Status: AC

## 2024-07-25 NOTE — Progress Notes (Unsigned)
 Cardiology Office Note:    Date:  07/25/2024   ID:  Catherine [name], [place] 11-24-1950, MRN [id number]  PCP:  Patient, No Pcp Per  Cardiologist:  Lamar Fitch, MD    Referring MD: No ref. provider found   No chief complaint on file.   History of Present Illness:    Catherine [name] is a 73 y.o. female past medical history significant for mitral valve prolapse, however, last echocardiogram did not confirm that, pulmonary hypertension which is upper limits of normal to mildly elevated, obstructive sleep apnea, breast cancer, comes today to my office for follow-up.  Overall she is struggling with the stress also she was diagnosed with repeated breast cancer required surgical intervention and then eventually radiation, she also tried some hormonal therapy but had difficulty tolerating it it leaves usually high blood pressure.  Denies have any chest pain tightness squeezing pressure burning chest.  Still trying to be active, have 4 dogs that she walk with some.  Past Medical History:  Diagnosis Date   Adiposity 05/26/2014   Allergic rhinoconjunctivitis 06/28/2015   Allergies    grasses, dander, dust, etc, etc   Anxiety    situational   Arthritis    Avitaminosis D 05/26/2014   Breast cancer (HCC)    De Quervain's tenosynovitis, left 11/08/2019   Elevation of level of transaminase or lactic acid dehydrogenase (LDH) 05/26/2014   Essential (primary) hypertension 05/26/2014   Family history of breast cancer    Family history of pancreatic cancer    Fatty infiltration of liver 05/26/2014   Fibromyalgia    Foot injury, left, initial encounter 05/17/2018   Genetic testing 08/01/2018   The Common Hereditary Cancer Panel offered by [facility] includes sequencing and/or deletion duplication testing of the following 55 genes: APC, ATM, AXIN2, BARD1, BLM, BMPR1A, BRCA1, BRCA2, BRIP1, BUB1B, CDH1, CDK4, CDKN2A, CEP57, CHEK2, CTNNA1, DICER1, ENG, EPCAM, GALNT12, GREM1, HOXB13, KIT, MEN1, MLH1, MLH3, MSH2, MSH3,  MSH6, MUTYH, NBN, NF1, NTHL1, PALB2, PDGFRA, PMS2, POLD1, POLE, PTEN, RAD50,    GERD (gastroesophageal reflux disease)    Heart murmur    not sure   HLD (hyperlipidemia) 05/26/2014   Hypertension    Laryngopharyngeal reflux 06/28/2015   Low back pain 05/26/2014   Malignant neoplasm of upper-outer quadrant of right breast in female, estrogen receptor positive (HCC) 08/18/2018   Mild pulmonary hypertension (HCC) 12/03/2015   Overview:  48 mmHg in August 2017  Formatting of this note might be different from the original. Overview:  48 mmHg in August 2017 Formatting of this note might be different from the original. 48 mmHg in August 2017   Mitral valve disease 05/26/2014   MVP (mitral valve prolapse)    Myalgia and myositis 05/26/2014   Nodular goiter, non-toxic 05/26/2014   Obesity    OSA (obstructive sleep apnea) 05/26/2014   Perimenopausal    Plantar fasciitis 05/26/2014   Pulmonary hypertension, primary (HCC) 05/24/2017   Most likely related to sleep apnea   Shingles    Sleep apnea    uses CPAP   Treatment-emergent central sleep apnea 06/30/2017    Past Surgical History:  Procedure Laterality Date   BIOPSY BREAST Left    BREAST BIOPSY Left 08/16/2023   US  LT RADIOACTIVE SEED LOC 08/16/2023 [facility]   BREAST LUMPECTOMY WITH RADIOACTIVE SEED AND SENTINEL LYMPH NODE BIOPSY Right 08/11/2018   Procedure: RIGHT BREAST LUMPECTOMY WITH BRACKETED RADIOACTIVE SEED AND RIGHT SENTINEL LYMPH NODE BIOPSY;  Surgeon: Ebbie Cough, MD;  Location: [facility] OR;  Service: General;  Laterality: Right;   BREAST LUMPECTOMY WITH RADIOACTIVE SEED LOCALIZATION Left 08/17/2023   Procedure: LEFT BREAST SEED GUIDED LUMPECTOMY;  Surgeon: Ebbie Cough, MD;  Location: [facility];  Service: General;  Laterality: Left;  LMA   KNEE SURGERY Right 06/02/1994    Current Medications: Current Meds  Medication Sig   calcium carbonate (OS-CAL - DOSED IN MG OF ELEMENTAL CALCIUM) 1250 (500 Ca)  MG tablet Take 1 tablet by mouth daily.   candesartan  (ATACAND ) 8 MG tablet TAKE 2 TABLETS BY MOUTH DAILY. PLEASE KEEP SCHEDULED APPOINTMENT FOR FUTURE REFILLS. THANK YOU (Patient taking differently: Take 16 mg by mouth daily. Takes 16mg  daily in the morning.)   cetirizine (ZYRTEC) 10 MG tablet Take 10 mg by mouth daily.   Cholecalciferol (VITAMIN D ) 2000 units CAPS Take 2,000 Units by mouth daily.    EPINEPHrine  0.3 mg/0.3 mL IJ SOAJ injection Use as directed for life-threatening allergic reaction. (Patient taking differently: Inject 0.3 mg into the muscle as needed for anaphylaxis. Use as directed for life-threatening allergic reaction.)   esomeprazole  (NEXIUM ) 20 MG capsule Take one capsule by mouth once daily. (Patient taking differently: Take 20 mg by mouth daily at 12 noon. Take one capsule by mouth once daily.)   exemestane  (AROMASIN ) 25 MG tablet Take 1 tablet (25 mg total) by mouth daily after breakfast.   ezetimibe  (ZETIA ) 10 MG tablet Take 1 tablet (10 mg total) by mouth daily.   guaifenesin (HUMIBID E) 400 MG TABS tablet Take 400 mg by mouth daily as needed (for cough/congestion).   icosapent  Ethyl (VASCEPA ) 1 g capsule Take 2 capsules (2 g total) by mouth 3 (three) times daily.   NASAL SALINE NA Place 1 spray into the nose daily.   Olopatadine  HCl 0.6 % SOLN Can use one to two sprays in each nostril one to two times daily if needed. (Patient taking differently: Place 2 sprays into both nostrils daily as needed (dryness). Can use one to two sprays in each nostril one to two times daily if needed.)   Olopatadine -Mometasone (RYALTRIS ) 665-25 MCG/ACT SUSP 2 sprays each nostril 1-2 times per day (Patient taking differently: Place 2 sprays into the nose See admin instructions. 2 sprays each nostril 1-2 times per day)   triamcinolone ointment (KENALOG) 0.1 % Apply 1 Application topically 2 (two) times daily.   valACYclovir  (VALTREX ) 1000 MG tablet Take 2 tabs and repeat in 12 hours. (Patient  taking differently: as directed. Take it as needed)     Allergies:   Other, Penicillin g, Zinacef [cefuroxime in sterile water], Fenofibrate, Rosuvastatin, Tape, Augmentin [amoxicillin-pot clavulanate], Desipramine hcl, Diphth-acell pertussis-tetanus, Silicone, Sulfasalazine, Tetanus antitoxin, Tetanus toxoid-containing vaccines, Tetanus-diphtheria toxoids td, Tetanus-diphtheria toxoids td, Thimerosal (thiomersal), Thimerosal and related, Avelox [moxifloxacin hcl in nacl], Diclofenac sodium, Diclofenac sodium, Iodine, Moxifloxacin, and Sulfa antibiotics   Social History   Socioeconomic History   Marital status: Married    Spouse name: Not on file   Number of children: Not on file   Years of education: Not on file   Highest education level: Not on file  Occupational History   Not on file  Tobacco Use   Smoking status: Never   Smokeless tobacco: Never  Vaping Use   Vaping status: Never Used  Substance and Sexual Activity   Alcohol use: No   Drug use: No   Sexual activity: Yes    Birth control/protection: Post-menopausal  Other Topics Concern   Not on file  Social History Narrative  Not on file   Social Drivers of Health   Financial Resource Strain: Not on file  Food Insecurity: Not on file  Transportation Needs: Not on file  Physical Activity: Not on file  Stress: Not on file  Social Connections: Not on file     Family History: The patient's family history includes Allergic rhinitis in her mother; Asthma in her maternal grandfather; Breast cancer (age of onset: 79) in an other family member; Cancer in her paternal grandmother; Diabetes in her mother; Heart attack in her father; Heart disease in her paternal aunt; Hypertension in her father and mother; Infertility in her sister; Kidney failure in her mother; Other in her cousin; Pancreatic cancer (age of onset: 87) in her maternal grandfather. ROS:   Please see the history of present illness.    All 14 point review of  systems negative except as described per history of present illness  EKGs/Labs/Other Studies Reviewed:         Recent Labs: 03/02/2024: Magnesium  2.3 07/20/2024: ALT 23; BUN 18; Creatinine 0.76; Hemoglobin 12.3; Platelet Count 270; Potassium 4.1; Sodium 141; TSH 1.790  Recent Lipid Panel    Component Value Date/Time   CHOL 207 (H) 03/02/2024 0947   CHOL 185 04/17/2021 0821   TRIG 150 (H) 03/02/2024 0947   HDL 58 03/02/2024 0947   HDL 46 04/17/2021 0821   CHOLHDL 3.6 03/02/2024 0947   VLDL 30 03/02/2024 0947   LDLCALC 119 (H) 03/02/2024 0947   LDLCALC 108 (H) 04/17/2021 0821    Physical Exam:    VS:  BP (!) 178/80   Pulse 76   Ht 5' 5.5 (1.664 m)   Wt 298 lb (135.2 kg)   LMP 11/02/2002 (Approximate)   SpO2 94%   BMI 48.84 kg/m     Wt Readings from Last 3 Encounters:  07/25/24 298 lb (135.2 kg)  07/20/24 (!) 303 lb 4.8 oz (137.6 kg)  06/07/24 (!) 306 lb 8 oz (139 kg)     GEN:  Well nourished, well developed in no acute distress HEENT: Normal NECK: No JVD; No carotid bruits LYMPHATICS: No lymphadenopathy CARDIAC: RRR, no murmurs, no rubs, no gallops RESPIRATORY:  Clear to auscultation without rales, wheezing or rhonchi  ABDOMEN: Soft, non-tender, non-distended MUSCULOSKELETAL:  No edema; No deformity  SKIN: Warm and dry LOWER EXTREMITIES: no swelling NEUROLOGIC:  Alert and oriented x 3 PSYCHIATRIC:  Normal affect   ASSESSMENT:    1. Essential (primary) hypertension   2. Mild pulmonary hypertension (HCC)   3. Mitral valve disease   4. OSA (obstructive sleep apnea)   5. Malignant neoplasm of upper-outer quadrant of right breast in female, estrogen receptor positive (HCC)    PLAN:    In order of problems listed above:  Essential hypertension uncontrolled, will give her instruction how to check her blood pressure meaning to check for Singh in the morning also relax for about 5 minutes before checking it.  She is already on 80 mg of plastic and, we will ask him  to start amlodipine  5 mg daily, I warned her about potential side effect of swelling of lower extremities with And will need to switch to different medication probably diuretic. Mitral valve disease.  Not noted on last echocardiogram continue monitoring. Pulmonary hypertension mild stable continue monitoring continue CPAP mask. Malignant breast cancer like always follow-up excellently by oncology team    Medication Adjustments/Labs and Tests Ordered: Current medicines are reviewed at length with the patient today.  Concerns regarding medicines are  outlined above.  No orders of the defined types were placed in this encounter.  Medication changes: No orders of the defined types were placed in this encounter.   Signed, Lamar DOROTHA Fitch, MD, [facility] 07/25/2024 3:43 PM    [facility]

## 2024-07-25 NOTE — Patient Instructions (Signed)
Medication Instructions:   START: Amlodipine 65m 1 tablet daily   Lab Work: None Ordered If you have labs (blood work) drawn today and your tests are completely normal, you will receive your results only by: MyChart Message (if you have MyChart) OR A paper copy in the mail If you have any lab test that is abnormal or we need to change your treatment, we will call you to review the results.   Testing/Procedures: None Ordered   Follow-Up: [facility] you and your health needs are our priority.  As part of our continuing mission to provide you with exceptional heart care, we have created designated Provider Care Teams.  These Care Teams include your primary Cardiologist (physician) and Advanced Practice Providers (APPs -  Physician Assistants and Nurse Practitioners) who all work together to provide you with the care you need, when you need it.  We recommend signing up for the patient portal called "MyChart".  Sign up information is provided on this After Visit Summary.  MyChart is used to connect with patients for Virtual Visits (Telemedicine).  Patients are able to view lab/test results, encounter notes, upcoming appointments, etc.  Non-urgent messages can be sent to your provider as well.   To learn more about what you can do with MyChart, go to [email or web address]    Your next appointment:   6 month(s)  The format for your next appointment:   In Person  Provider:   RJenne Campus MD    Other Instructions NA

## 2024-07-26 ENCOUNTER — Ambulatory Visit (INDEPENDENT_AMBULATORY_CARE_PROVIDER_SITE_OTHER): Payer: Self-pay

## 2024-07-26 DIAGNOSIS — J309 Allergic rhinitis, unspecified: Secondary | ICD-10-CM

## 2024-07-26 DIAGNOSIS — E042 Nontoxic multinodular goiter: Secondary | ICD-10-CM | POA: Diagnosis not present

## 2024-07-26 DIAGNOSIS — C50411 Malignant neoplasm of upper-outer quadrant of right female breast: Secondary | ICD-10-CM | POA: Diagnosis not present

## 2024-07-26 DIAGNOSIS — I10 Essential (primary) hypertension: Secondary | ICD-10-CM | POA: Diagnosis not present

## 2024-07-26 DIAGNOSIS — E782 Mixed hyperlipidemia: Secondary | ICD-10-CM | POA: Diagnosis not present

## 2024-07-26 DIAGNOSIS — E559 Vitamin D deficiency, unspecified: Secondary | ICD-10-CM | POA: Diagnosis not present

## 2024-07-26 NOTE — Telephone Encounter (Signed)
 Per [employer], New, Catherine [name], Catherine [name], CMA; Catherine [name]; Catherine [name] Hello,  Looks like this patinet recieved supplies on 05/12/2024. Doesn't looke like she is eligable for new supplies till 08-12-2024. Loosk like she is getting supples ever 3 months. I will ask our supply team to reach out for details and see what we may be able to supply at this time.

## 2024-07-28 ENCOUNTER — Encounter: Payer: Self-pay | Admitting: Oncology

## 2024-07-31 ENCOUNTER — Encounter: Payer: Self-pay | Admitting: Oncology

## 2024-08-04 ENCOUNTER — Other Ambulatory Visit: Payer: Self-pay | Admitting: Cardiology

## 2024-08-09 ENCOUNTER — Other Ambulatory Visit: Payer: Self-pay | Admitting: Cardiology

## 2024-08-10 DIAGNOSIS — I10 Essential (primary) hypertension: Secondary | ICD-10-CM | POA: Diagnosis not present

## 2024-08-15 DIAGNOSIS — Z20828 Contact with and (suspected) exposure to other viral communicable diseases: Secondary | ICD-10-CM | POA: Diagnosis not present

## 2024-08-16 ENCOUNTER — Ambulatory Visit: Payer: Self-pay

## 2024-08-16 DIAGNOSIS — M9905 Segmental and somatic dysfunction of pelvic region: Secondary | ICD-10-CM | POA: Diagnosis not present

## 2024-08-16 DIAGNOSIS — M9903 Segmental and somatic dysfunction of lumbar region: Secondary | ICD-10-CM | POA: Diagnosis not present

## 2024-08-16 DIAGNOSIS — M9902 Segmental and somatic dysfunction of thoracic region: Secondary | ICD-10-CM | POA: Diagnosis not present

## 2024-08-16 DIAGNOSIS — J309 Allergic rhinitis, unspecified: Secondary | ICD-10-CM

## 2024-08-16 MED ORDER — RYALTRIS 665-25 MCG/ACT NA SUSP: NASAL | 5 refills | Status: AC

## 2024-08-21 DIAGNOSIS — L02213 Cutaneous abscess of chest wall: Secondary | ICD-10-CM | POA: Diagnosis not present

## 2024-08-24 ENCOUNTER — Ambulatory Visit (INDEPENDENT_AMBULATORY_CARE_PROVIDER_SITE_OTHER): Payer: Self-pay | Admitting: *Deleted

## 2024-08-24 DIAGNOSIS — J309 Allergic rhinitis, unspecified: Secondary | ICD-10-CM

## 2024-08-30 DIAGNOSIS — Z20828 Contact with and (suspected) exposure to other viral communicable diseases: Secondary | ICD-10-CM | POA: Diagnosis not present

## 2024-09-07 ENCOUNTER — Ambulatory Visit (INDEPENDENT_AMBULATORY_CARE_PROVIDER_SITE_OTHER): Admitting: *Deleted

## 2024-09-07 DIAGNOSIS — J309 Allergic rhinitis, unspecified: Secondary | ICD-10-CM | POA: Diagnosis not present

## 2024-09-07 DIAGNOSIS — J302 Other seasonal allergic rhinitis: Secondary | ICD-10-CM

## 2024-09-14 DIAGNOSIS — M9902 Segmental and somatic dysfunction of thoracic region: Secondary | ICD-10-CM | POA: Diagnosis not present

## 2024-09-14 DIAGNOSIS — M9903 Segmental and somatic dysfunction of lumbar region: Secondary | ICD-10-CM | POA: Diagnosis not present

## 2024-09-14 DIAGNOSIS — M9905 Segmental and somatic dysfunction of pelvic region: Secondary | ICD-10-CM | POA: Diagnosis not present

## 2024-10-04 ENCOUNTER — Other Ambulatory Visit (HOSPITAL_BASED_OUTPATIENT_CLINIC_OR_DEPARTMENT_OTHER): Admitting: Radiology

## 2024-10-05 ENCOUNTER — Ambulatory Visit: Admitting: *Deleted

## 2024-10-05 DIAGNOSIS — J309 Allergic rhinitis, unspecified: Secondary | ICD-10-CM

## 2024-10-05 DIAGNOSIS — J302 Other seasonal allergic rhinitis: Secondary | ICD-10-CM | POA: Diagnosis not present

## 2024-10-07 DIAGNOSIS — L821 Other seborrheic keratosis: Secondary | ICD-10-CM | POA: Diagnosis not present

## 2024-10-07 DIAGNOSIS — D225 Melanocytic nevi of trunk: Secondary | ICD-10-CM | POA: Diagnosis not present

## 2024-10-07 DIAGNOSIS — D485 Neoplasm of uncertain behavior of skin: Secondary | ICD-10-CM | POA: Diagnosis not present

## 2024-10-09 ENCOUNTER — Inpatient Hospital Stay: Attending: Oncology

## 2024-10-09 ENCOUNTER — Ambulatory Visit (INDEPENDENT_AMBULATORY_CARE_PROVIDER_SITE_OTHER)
Admission: RE | Admit: 2024-10-09 | Discharge: 2024-10-09 | Disposition: A | Source: Ambulatory Visit | Attending: Oncology | Admitting: Oncology

## 2024-10-09 DIAGNOSIS — Z170 Estrogen receptor positive status [ER+]: Secondary | ICD-10-CM | POA: Diagnosis not present

## 2024-10-09 DIAGNOSIS — M8580 Other specified disorders of bone density and structure, unspecified site: Secondary | ICD-10-CM

## 2024-10-09 DIAGNOSIS — C50812 Malignant neoplasm of overlapping sites of left female breast: Secondary | ICD-10-CM | POA: Insufficient documentation

## 2024-10-09 DIAGNOSIS — M85831 Other specified disorders of bone density and structure, right forearm: Secondary | ICD-10-CM | POA: Diagnosis not present

## 2024-10-09 DIAGNOSIS — M85852 Other specified disorders of bone density and structure, left thigh: Secondary | ICD-10-CM | POA: Diagnosis not present

## 2024-10-09 DIAGNOSIS — I10 Essential (primary) hypertension: Secondary | ICD-10-CM | POA: Diagnosis not present

## 2024-10-09 DIAGNOSIS — M85832 Other specified disorders of bone density and structure, left forearm: Secondary | ICD-10-CM | POA: Diagnosis not present

## 2024-10-09 DIAGNOSIS — Z780 Asymptomatic menopausal state: Secondary | ICD-10-CM | POA: Diagnosis not present

## 2024-10-09 DIAGNOSIS — M85851 Other specified disorders of bone density and structure, right thigh: Secondary | ICD-10-CM | POA: Insufficient documentation

## 2024-10-09 DIAGNOSIS — E039 Hypothyroidism, unspecified: Secondary | ICD-10-CM | POA: Diagnosis not present

## 2024-10-09 DIAGNOSIS — C50212 Malignant neoplasm of upper-inner quadrant of left female breast: Secondary | ICD-10-CM

## 2024-10-09 LAB — CMP (CANCER CENTER ONLY)
ALT: 23 U/L (ref 0–44)
AST: 27 U/L (ref 15–41)
Albumin: 4 g/dL (ref 3.5–5.0)
Alkaline Phosphatase: 75 U/L (ref 38–126)
Anion gap: 11 (ref 5–15)
BUN: 18 mg/dL (ref 8–23)
CO2: 25 mmol/L (ref 22–32)
Calcium: 9.6 mg/dL (ref 8.9–10.3)
Chloride: 106 mmol/L (ref 98–111)
Creatinine: 0.83 mg/dL (ref 0.44–1.00)
GFR, Estimated: >60 mL/min (ref >60)
Glucose, Bld: 117 mg/dL — ABNORMAL HIGH (ref 70–99)
Potassium: 3.8 mmol/L (ref 3.5–5.1)
Sodium: 141 mmol/L (ref 135–145)
Total Bilirubin: 0.4 mg/dL (ref 0.0–1.2)
Total Protein: 7.4 g/dL (ref 6.5–8.1)

## 2024-10-09 LAB — CBC WITH DIFFERENTIAL (CANCER CENTER ONLY)
Abs Immature Granulocytes: 0.03 K/uL (ref 0.00–0.07)
Basophils Absolute: 0 K/uL (ref 0.0–0.1)
Basophils Relative: 0 %
Eosinophils Absolute: 0.1 K/uL (ref 0.0–0.5)
Eosinophils Relative: 1 %
HCT: 37 % (ref 36.0–46.0)
Hemoglobin: 12.2 g/dL (ref 12.0–15.0)
Immature Granulocytes: 1 %
Lymphocytes Relative: 27 %
Lymphs Abs: 1.3 K/uL (ref 0.7–4.0)
MCH: 29.8 pg (ref 26.0–34.0)
MCHC: 33 g/dL (ref 30.0–36.0)
MCV: 90.2 fL (ref 80.0–100.0)
Monocytes Absolute: 0.3 K/uL (ref 0.1–1.0)
Monocytes Relative: 7 %
Neutro Abs: 3.1 K/uL (ref 1.7–7.7)
Neutrophils Relative %: 64 %
Platelet Count: 291 K/uL (ref 150–400)
RBC: 4.1 MIL/uL (ref 3.87–5.11)
RDW: 13.4 % (ref 11.5–15.5)
WBC Count: 4.8 K/uL (ref 4.0–10.5)
nRBC: 0 % (ref 0.0–0.2)

## 2024-10-09 LAB — TSH: TSH: 2.2 u[IU]/mL (ref 0.350–4.500)

## 2024-10-10 LAB — T4: T4, Total: 8.7 ug/dL (ref 4.5–12.0)

## 2024-10-11 ENCOUNTER — Other Ambulatory Visit (HOSPITAL_BASED_OUTPATIENT_CLINIC_OR_DEPARTMENT_OTHER): Payer: Medicare PPO

## 2024-10-11 ENCOUNTER — Encounter (HOSPITAL_BASED_OUTPATIENT_CLINIC_OR_DEPARTMENT_OTHER): Payer: Self-pay | Admitting: Obstetrics & Gynecology

## 2024-10-11 ENCOUNTER — Ambulatory Visit (HOSPITAL_BASED_OUTPATIENT_CLINIC_OR_DEPARTMENT_OTHER): Payer: Medicare PPO | Admitting: Obstetrics & Gynecology

## 2024-10-11 VITALS — BP 120/70 | HR 66 | Ht 65.0 in | Wt 301.0 lb

## 2024-10-11 DIAGNOSIS — C50212 Malignant neoplasm of upper-inner quadrant of left female breast: Secondary | ICD-10-CM | POA: Diagnosis not present

## 2024-10-11 DIAGNOSIS — B009 Herpesviral infection, unspecified: Secondary | ICD-10-CM

## 2024-10-11 DIAGNOSIS — Z803 Family history of malignant neoplasm of breast: Secondary | ICD-10-CM

## 2024-10-11 DIAGNOSIS — N83202 Unspecified ovarian cyst, left side: Secondary | ICD-10-CM

## 2024-10-11 DIAGNOSIS — Z01419 Encounter for gynecological examination (general) (routine) without abnormal findings: Secondary | ICD-10-CM

## 2024-10-11 DIAGNOSIS — C50411 Malignant neoplasm of upper-outer quadrant of right female breast: Secondary | ICD-10-CM | POA: Diagnosis not present

## 2024-10-11 DIAGNOSIS — D251 Intramural leiomyoma of uterus: Secondary | ICD-10-CM

## 2024-10-11 DIAGNOSIS — Z170 Estrogen receptor positive status [ER+]: Secondary | ICD-10-CM | POA: Diagnosis not present

## 2024-10-11 DIAGNOSIS — M8580 Other specified disorders of bone density and structure, unspecified site: Secondary | ICD-10-CM

## 2024-10-11 DIAGNOSIS — Z780 Asymptomatic menopausal state: Secondary | ICD-10-CM | POA: Diagnosis not present

## 2024-10-11 DIAGNOSIS — Z9189 Other specified personal risk factors, not elsewhere classified: Secondary | ICD-10-CM

## 2024-10-11 DIAGNOSIS — K069 Disorder of gingiva and edentulous alveolar ridge, unspecified: Secondary | ICD-10-CM

## 2024-10-11 MED ORDER — VALACYCLOVIR HCL 1 G PO TABS: ORAL_TABLET | ORAL | 1 refills | Status: AC

## 2024-10-11 NOTE — Progress Notes (Deleted)
 GYNECOLOGY  VISIT  CC:   No chief complaint on file.   HPI: 73 y.o. G0P0000 Married White or Caucasian female here for ultrasound follow up.  Patient's last menstrual period was 11/02/2002 (approximate).  Past Medical History:  Diagnosis Date   Adiposity 05/26/2014   Allergic rhinoconjunctivitis 06/28/2015   Allergies    grasses, dander, dust, etc, etc   Anxiety    situational   Arthritis    Avitaminosis D 05/26/2014   Breast cancer (HCC)    De Quervain's tenosynovitis, left 11/08/2019   Elevation of level of transaminase or lactic acid dehydrogenase (LDH) 05/26/2014   Essential (primary) hypertension 05/26/2014   Family history of breast cancer    Family history of pancreatic cancer    Fatty infiltration of liver 05/26/2014   Fibromyalgia    Foot injury, left, initial encounter 05/17/2018   Genetic testing 08/01/2018   The Common Hereditary Cancer Panel offered by [facility] includes sequencing and/or deletion duplication testing of the following 55 genes: APC, ATM, AXIN2, BARD1, BLM, BMPR1A, BRCA1, BRCA2, BRIP1, BUB1B, CDH1, CDK4, CDKN2A, CEP57, CHEK2, CTNNA1, DICER1, ENG, EPCAM, GALNT12, GREM1, HOXB13, KIT, MEN1, MLH1, MLH3, MSH2, MSH3, MSH6, MUTYH, NBN, NF1, NTHL1, PALB2, PDGFRA, PMS2, POLD1, POLE, PTEN, RAD50,    GERD (gastroesophageal reflux disease)    Heart murmur    not sure   HLD (hyperlipidemia) 05/26/2014   Hypertension    Laryngopharyngeal reflux 06/28/2015   Low back pain 05/26/2014   Malignant neoplasm of upper-outer quadrant of right breast in female, estrogen receptor positive (HCC) 08/18/2018   Mild pulmonary hypertension (HCC) 12/03/2015   Overview:  48 mmHg in August 2017  Formatting of this note might be different from the original. Overview:  48 mmHg in August 2017 Formatting of this note might be different from the original. 48 mmHg in August 2017   Mitral valve disease 05/26/2014   MVP (mitral valve prolapse)    Myalgia and myositis 05/26/2014   Nodular goiter,  non-toxic 05/26/2014   Obesity    OSA (obstructive sleep apnea) 05/26/2014   Perimenopausal    Plantar fasciitis 05/26/2014   Pulmonary hypertension, primary (HCC) 05/24/2017   Most likely related to sleep apnea   Shingles    Sleep apnea    uses CPAP   Treatment-emergent central sleep apnea 06/30/2017    MEDS:  Reviewed in EPIC  ALLERGIES: Other, Penicillin g, Zinacef [cefuroxime in sterile water], Fenofibrate, Rosuvastatin, Tape, Augmentin [amoxicillin-pot clavulanate], Desipramine hcl, Diphth-acell pertussis-tetanus, Silicone, Sulfasalazine, Tetanus antitoxin, Tetanus toxoid-containing vaccines, Tetanus-diphtheria toxoids td, Tetanus-diphtheria toxoids td, Thimerosal (thiomersal), Thimerosal and related, Avelox [moxifloxacin hcl in nacl], Diclofenac sodium, Diclofenac sodium, Iodine, Moxifloxacin, and Sulfa antibiotics  SH:  ***  ROS  PHYSICAL EXAMINATION:    LMP 11/02/2002 (Approximate)     General appearance: alert, cooperative and appears stated age Neck: no adenopathy, supple, symmetrical, trachea midline and thyroid  {CHL AMB PHY EX THYROID  NORM DEFAULT:[phone]::normal to inspection and palpation} CV:  {Exam; heart brief:31539} Lungs:  {pe lungs ob:314451} Breasts: {Exam; breast:13139::normal appearance, no masses or tenderness} Abdomen: soft, non-tender; bowel sounds normal; no masses,  no organomegaly Lymph:  no inguinal LAD noted  Pelvic: External genitalia:  no lesions              Urethra:  normal appearing urethra with no masses, tenderness or lesions              Bartholins and Skenes: normal  Vagina: {exam; pelvic vaginal:30846}              Cervix: {CHL AMB PHY EX CERVIX NORM DEFAULT:[id number]::no lesions}              Bimanual Exam:  Uterus:  {CHL AMB PHY EX UTERUS NORM DEFAULT:[phone]::normal size, contour, position, consistency, mobility, non-tender}              Adnexa: {CHL AMB PHY EX ADNEXA NO MASS DEFAULT:[phone]::no mass,  fullness, tenderness}              Rectovaginal: {yes no:314532}.  Confirms.              Anus:  normal sphincter tone, no lesions  Chaperone was present for exam.  Assessment/Plan: There are no diagnoses linked to this encounter.

## 2024-10-11 NOTE — Progress Notes (Unsigned)
 Breast and Pelvic Exam Patient name: Catherine [name] MRN [id number]  Date of birth: 1950/11/08 Chief Complaint:   Gynecologic Exam  History of Present Illness:   Catherine [name] is a 73 y.o. G0P0000 Caucasian female being seen today for breast and pelvic exam.  She did have breast cancer diagnosed on the opposite breast.  She did have a lumpectomy and radiation for this.  She has side effects with the initial post treatment oral medication.  Aromasin  has been written for her and she had significant side effects.  She just feels she can't feel like she did on the medication.    Denies vaginal bleeding.  Has a place on her breast that has an abnormal appearance.  She had this biopsies with dermatology last Saturday.    Patient's last menstrual period was 11/02/2002 (approximate).  Last pap 01/30/2021. Results were: NILM w/ HRHPV negative. H/O abnormal pap: no Last mammogram: 07/17/2014. Results were: normal. Family h/o breast cancer: yes self. Last colonoscopy: 07/2019. Results were: normal. Family h/o colorectal cancer: no     07/20/2024   11:26 AM 07/22/2022    9:03 AM  Depression screen PHQ 2/9  Decreased Interest 0 0  Down, Depressed, Hopeless 0 0  PHQ - 2 Score 0 0       Review of Systems:   Pertinent items are noted in HPI Denies any headaches, blurred vision, fatigue, shortness of breath, chest pain, abdominal pain, abnormal vaginal discharge/itching/odor/irritation, problems with periods, bowel movements, urination, or intercourse unless otherwise stated above. Pertinent History Reviewed:  Reviewed past medical,surgical, social and family history.  Reviewed problem list, medications and allergies. Physical Assessment:   Vitals:   10/11/24 1011  BP: 120/70  Pulse: 66  SpO2: 100%  Weight: (!) 301 lb (136.5 kg)  Height: 5' 5 (1.651 m)  Body mass index is 50.09 kg/m.        Physical Examination:   General appearance - well appearing, and in no distress  Mental  status - alert, oriented to person, place, and time  Psych:  She has a normal mood and affect  Skin - warm and dry, normal color, no suspicious lesions noted  Chest - effort normal, all lung fields clear to auscultation bilaterally  Heart - normal rate and regular rhythm  Neck:  midline trachea, no thyromegaly or nodules  Breasts - breasts appear normal, no suspicious masses, no skin or nipple changes or  axillary nodes  Abdomen - soft, nontender, nondistended, no masses or organomegaly  Pelvic - VULVA: normal appearing vulva with no masses, tenderness or lesions   VAGINA: normal appearing vagina with normal color and discharge, no lesions   CERVIX: normal appearing cervix without discharge or lesions, no CMT  Thin prep pap is not indicated.    UTERUS: uterus is felt to be normal size, shape, consistency and nontender   ADNEXA: No adnexal masses or tenderness noted.  Rectal - normal rectal, good sphincter tone, no masses felt  Extremities:  No swelling or varicosities noted  Chaperone present for exam  No results found for this or any previous visit (from the past 24 hours).  Assessment & Plan:  1) Well-Woman Exam  2) ***  Labs/procedures today: ***  Mammogram: {Mammo f/u:25212::@ 73yo}, or sooner if problems Colonoscopy: {TCS f/u:25213::@ 73yo}, or sooner if problems  No orders of the defined types were placed in this encounter.   Meds: No orders of the defined types were placed in this encounter.   Follow-up:  No follow-ups on file.  Ronal GORMAN Pinal, MD 10/11/2024 11:35 AM

## 2024-10-19 ENCOUNTER — Ambulatory Visit: Admitting: Oncology

## 2024-10-19 ENCOUNTER — Other Ambulatory Visit: Payer: Self-pay | Admitting: Oncology

## 2024-10-19 ENCOUNTER — Ambulatory Visit (INDEPENDENT_AMBULATORY_CARE_PROVIDER_SITE_OTHER): Admitting: *Deleted

## 2024-10-19 ENCOUNTER — Telehealth: Payer: Self-pay | Admitting: Oncology

## 2024-10-19 ENCOUNTER — Encounter: Payer: Self-pay | Admitting: Oncology

## 2024-10-19 ENCOUNTER — Inpatient Hospital Stay: Admitting: Oncology

## 2024-10-19 ENCOUNTER — Other Ambulatory Visit

## 2024-10-19 VITALS — BP 138/70 | HR 62 | Temp 98.0°F | Resp 16 | Ht 65.0 in | Wt 303.2 lb

## 2024-10-19 DIAGNOSIS — J302 Other seasonal allergic rhinitis: Secondary | ICD-10-CM

## 2024-10-19 DIAGNOSIS — M8580 Other specified disorders of bone density and structure, unspecified site: Secondary | ICD-10-CM

## 2024-10-19 DIAGNOSIS — Z780 Asymptomatic menopausal state: Secondary | ICD-10-CM | POA: Diagnosis not present

## 2024-10-19 DIAGNOSIS — C50212 Malignant neoplasm of upper-inner quadrant of left female breast: Secondary | ICD-10-CM

## 2024-10-19 DIAGNOSIS — Z170 Estrogen receptor positive status [ER+]: Secondary | ICD-10-CM

## 2024-10-19 DIAGNOSIS — J309 Allergic rhinitis, unspecified: Secondary | ICD-10-CM

## 2024-10-19 DIAGNOSIS — C50812 Malignant neoplasm of overlapping sites of left female breast: Secondary | ICD-10-CM | POA: Diagnosis not present

## 2024-10-19 NOTE — Telephone Encounter (Signed)
 Patient has been scheduled for follow-up visit per 10/19/2024 LOS.  Pt noted appt details on personal electronic device.
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
## 2024-10-29 ENCOUNTER — Encounter: Payer: Self-pay | Admitting: Oncology

## 2024-11-14 ENCOUNTER — Ambulatory Visit: Admitting: *Deleted

## 2024-11-14 DIAGNOSIS — J302 Other seasonal allergic rhinitis: Secondary | ICD-10-CM | POA: Diagnosis not present

## 2024-12-06 ENCOUNTER — Ambulatory Visit

## 2024-12-06 DIAGNOSIS — J302 Other seasonal allergic rhinitis: Secondary | ICD-10-CM | POA: Diagnosis not present

## 2025-01-22 ENCOUNTER — Ambulatory Visit: Admitting: Cardiology

## 2025-02-14 ENCOUNTER — Inpatient Hospital Stay: Admitting: Oncology

## 2025-02-14 ENCOUNTER — Inpatient Hospital Stay
